# Patient Record
Sex: Female | Born: 1960 | Race: White | Hispanic: No | Marital: Married | Smoking: Never smoker
Health system: Southern US, Community
[De-identification: ages and names within clinical notes are randomized; demographics above are authoritative.]

## PROBLEM LIST (undated history)

## (undated) DIAGNOSIS — K219 Gastro-esophageal reflux disease without esophagitis: Secondary | ICD-10-CM

## (undated) DIAGNOSIS — I10 Essential (primary) hypertension: Secondary | ICD-10-CM

## (undated) DIAGNOSIS — T7840XA Allergy, unspecified, initial encounter: Secondary | ICD-10-CM

## (undated) DIAGNOSIS — M7712 Lateral epicondylitis, left elbow: Secondary | ICD-10-CM

## (undated) HISTORY — DX: Morbid (severe) obesity due to excess calories: E66.01

## (undated) HISTORY — DX: Allergy, unspecified, initial encounter: T78.40XA

## (undated) HISTORY — PX: COLONOSCOPY: SHX174

## (undated) HISTORY — PX: NO PAST SURGERIES: SHX2092

## (undated) HISTORY — DX: Essential (primary) hypertension: I10

## (undated) HISTORY — DX: Gastro-esophageal reflux disease without esophagitis: K21.9

---

## 2006-02-10 ENCOUNTER — Ambulatory Visit: Payer: Self-pay | Admitting: Internal Medicine

## 2006-12-24 ENCOUNTER — Ambulatory Visit: Payer: Self-pay | Admitting: Specialist

## 2007-06-02 ENCOUNTER — Ambulatory Visit: Payer: Self-pay | Admitting: Internal Medicine

## 2008-11-19 ENCOUNTER — Ambulatory Visit: Payer: Self-pay | Admitting: Internal Medicine

## 2010-09-19 ENCOUNTER — Ambulatory Visit (INDEPENDENT_AMBULATORY_CARE_PROVIDER_SITE_OTHER): Payer: BC Managed Care – PPO | Admitting: Family Medicine

## 2010-09-19 ENCOUNTER — Other Ambulatory Visit (HOSPITAL_COMMUNITY)
Admission: RE | Admit: 2010-09-19 | Discharge: 2010-09-19 | Disposition: A | Discharge status: Home / Self Care | Payer: BC Managed Care – PPO | Source: Ambulatory Visit | Attending: Family Medicine | Admitting: Family Medicine

## 2010-09-19 ENCOUNTER — Encounter: Payer: Self-pay | Admitting: Family Medicine

## 2010-09-19 DIAGNOSIS — R87810 Cervical high risk human papillomavirus (HPV) DNA test positive: Secondary | ICD-10-CM | POA: Insufficient documentation

## 2010-09-19 DIAGNOSIS — Z1231 Encounter for screening mammogram for malignant neoplasm of breast: Secondary | ICD-10-CM

## 2010-09-19 DIAGNOSIS — Z862 Personal history of diseases of the blood and blood-forming organs and certain disorders involving the immune mechanism: Secondary | ICD-10-CM | POA: Insufficient documentation

## 2010-09-19 DIAGNOSIS — Z01419 Encounter for gynecological examination (general) (routine) without abnormal findings: Secondary | ICD-10-CM | POA: Insufficient documentation

## 2010-09-19 DIAGNOSIS — I10 Essential (primary) hypertension: Secondary | ICD-10-CM

## 2010-09-19 DIAGNOSIS — K219 Gastro-esophageal reflux disease without esophagitis: Secondary | ICD-10-CM | POA: Insufficient documentation

## 2010-09-19 DIAGNOSIS — Z0000 Encounter for general adult medical examination without abnormal findings: Secondary | ICD-10-CM

## 2010-09-19 DIAGNOSIS — J309 Allergic rhinitis, unspecified: Secondary | ICD-10-CM | POA: Insufficient documentation

## 2010-09-19 MED ORDER — LISINOPRIL-HYDROCHLOROTHIAZIDE 20-25 MG PO TABS: 1.0000 | ORAL_TABLET | Freq: Every day | ORAL | Status: DC

## 2010-09-19 NOTE — Patient Instructions (Addendum)
Look into when last tetanus was. Work on regular exercsie, weight loss and healthy diet. Try to increase fruits and veggies, fiber.

## 2010-09-19 NOTE — Assessment & Plan Note (Signed)
Stable on omeprazole, but has to take daily. Works on low irritant diet.

## 2010-09-19 NOTE — Progress Notes (Signed)
Subjective:    Patient ID: Bridget Reed, female    DOB: 11-28-1960, 49 y.o.   MRN: [id number]  HPI  50 year old female here to establish with history of HTN on lisinopril, HCTZ .  HTN is well controlled.  Has history of  Iron defanemia.. Unclear cause. But nml endoscopy, colonoscopy showed polyps   She also has GERD, well controlled on omeprazole 20 mg daily.  Last CPX several years ago.. Had nml pap and mammogram at that time. May have had tetanus at [facility] where she works.  Recent labs for chol/ CMET/ TSH at [facility] wellness.    Review of Systems  Constitutional: Negative for fever, fatigue and unexpected weight change.  HENT: Positive for congestion. Negative for ear pain, sore throat, sneezing, trouble swallowing and sinus pressure.   Eyes: Negative for pain and itching.  Respiratory: Negative for cough, shortness of breath and wheezing.   Cardiovascular: Negative for chest pain, palpitations and leg swelling.  Gastrointestinal: Negative for nausea, abdominal pain, diarrhea, constipation and blood in stool.  Genitourinary: Negative for dysuria, hematuria, vaginal discharge, difficulty urinating and menstrual problem.  Skin: Negative for rash.  Neurological: Negative for syncope, weakness, light-headedness, numbness and headaches.  Psychiatric/Behavioral: Negative for confusion and dysphoric mood. The patient is not nervous/anxious.        Increase stress.       Objective:   Physical Exam  Constitutional: Vital signs are normal. She appears well-developed and well-nourished. She is cooperative.  Non-toxic appearance. She does not appear ill. No distress.  HENT:  Head: Normocephalic.  Right Ear: Hearing, tympanic membrane, external ear and ear canal normal.  Left Ear: Hearing, tympanic membrane, external ear and ear canal normal.  Nose: Nose normal.  Eyes: Conjunctivae, EOM and lids are normal. Pupils are equal, round, and reactive to light. No foreign bodies found.  Neck:  Trachea normal and normal range of motion. Neck supple. Carotid bruit is not present. No mass and no thyromegaly present.  Cardiovascular: Normal rate, regular rhythm, S1 normal, S2 normal, normal heart sounds and intact distal pulses.  Exam reveals no gallop.   No murmur heard. Pulmonary/Chest: Effort normal and breath sounds normal. No respiratory distress. She has no wheezes. She has no rhonchi. She has no rales.  Abdominal: Soft. Normal appearance and bowel sounds are normal. She exhibits no distension, no fluid wave, no abdominal bruit and no mass. There is no hepatosplenomegaly. There is no tenderness. There is no rebound, no guarding and no CVA tenderness. No hernia.  Genitourinary: Vagina normal and uterus normal. No breast swelling, tenderness, discharge or bleeding. Pelvic exam was performed with patient prone. There is no rash, tenderness or lesion on the right labia. There is no rash, tenderness or lesion on the left labia. Uterus is not enlarged and not tender. Cervix exhibits no motion tenderness, no discharge and no friability. Right adnexum displays no mass, no tenderness and no fullness. Left adnexum displays no mass, no tenderness and no fullness.  Lymphadenopathy:    She has no cervical adenopathy.    She has no axillary adenopathy.  Neurological: She is alert. She has normal strength. No cranial nerve deficit or sensory deficit.  Skin: Skin is warm, dry and intact. No rash noted.  Psychiatric: Her speech is normal and behavior is normal. Judgment normal. Her mood appears not anxious. Cognition and memory are normal. She does not exhibit a depressed mood.          Assessment & Plan:  Complete  Physical Exam: The patient's preventative maintenance and recommended screening tests for an annual wellness exam were reviewed in full today. Brought up to date unless services declined.  Counselled on the importance of diet, exercise, and its role in overall health and mortality. The  patient's FH and SH was reviewed, including their home life, tobacco status, and drug and alcohol status.

## 2010-09-19 NOTE — Assessment & Plan Note (Signed)
Stable on OTC meds 

## 2010-09-19 NOTE — Assessment & Plan Note (Signed)
Recent addition of HCTZ with BPs increased due to stress. Well controlled since on HCTZ. Continue current medication.

## 2010-09-29 ENCOUNTER — Encounter: Payer: Self-pay | Admitting: *Deleted

## 2011-01-16 ENCOUNTER — Other Ambulatory Visit: Payer: Self-pay | Admitting: *Deleted

## 2011-01-16 MED ORDER — OMEPRAZOLE 20 MG PO TBEC: 1.0000 | DELAYED_RELEASE_TABLET | Freq: Every day | ORAL | Status: DC

## 2011-06-12 ENCOUNTER — Encounter: Payer: Self-pay | Admitting: Family Medicine

## 2011-06-12 ENCOUNTER — Ambulatory Visit (INDEPENDENT_AMBULATORY_CARE_PROVIDER_SITE_OTHER): Payer: BC Managed Care – PPO | Admitting: Family Medicine

## 2011-06-12 DIAGNOSIS — J069 Acute upper respiratory infection, unspecified: Secondary | ICD-10-CM

## 2011-06-12 NOTE — Patient Instructions (Signed)
I think this is likely a nonflu virus, a cold.  They can make your joints and muscles ache for days.  I would take aleve, 2  tabs twice a day with food. Drink plenty of fluids.  I would stay out of work until the aches are better. You are potentially contagious.  Get some rest.

## 2011-06-12 NOTE — Progress Notes (Signed)
duration of symptoms: 1-2 days, didn't feel well yesterday AM.   Rhinorrhea: no Congestion: yes ear pain: minimal sore throat: no Cough:yes, possibly from post nasal gtt myalgias:yes other concerns:HA but no fever.  Her temp is mildly elevated today Mult sick exposures at work.   ROS: See HPI.  Otherwise negative.    Meds, vitals, and allergies reviewed.   GEN: nad, alert and oriented, nontoxic.  HEENT: mucous membranes moist, TM w/o erythema, nasal epithelium injected, OP with cobblestoning NECK: supple w/o LA CV: rrr. PULM: ctab, no inc wob ABD: soft, +bs EXT: no edema

## 2011-06-14 DIAGNOSIS — J069 Acute upper respiratory infection, unspecified: Secondary | ICD-10-CM | POA: Insufficient documentation

## 2011-06-14 NOTE — Assessment & Plan Note (Signed)
Nontoxic, likely viral, supportive care.  See instructions.  F/u prn.

## 2011-06-15 ENCOUNTER — Encounter: Payer: Self-pay | Admitting: *Deleted

## 2011-06-15 ENCOUNTER — Telehealth: Payer: Self-pay | Admitting: Family Medicine

## 2011-06-15 ENCOUNTER — Encounter: Payer: Self-pay | Admitting: Family Medicine

## 2011-06-15 ENCOUNTER — Ambulatory Visit (INDEPENDENT_AMBULATORY_CARE_PROVIDER_SITE_OTHER): Payer: BC Managed Care – PPO | Admitting: Family Medicine

## 2011-06-15 VITALS — BP 110/70 | HR 118 | Temp 99.5°F | Ht 62.0 in | Wt 222.8 lb

## 2011-06-15 DIAGNOSIS — J069 Acute upper respiratory infection, unspecified: Secondary | ICD-10-CM

## 2011-06-15 MED ORDER — HYDROCOD POLST-CHLORPHEN POLST 10-8 MG/5ML PO LQCR: 5.0000 mL | Freq: Every evening | ORAL | Status: DC | PRN

## 2011-06-15 NOTE — Telephone Encounter (Signed)
Triage Record Num: [id number] Operator: Valene Bors Patient Name: Bridget Reed Call Date & Time: 06/15/2011 8:48:29AM Patient Phone: [phone] PCP: Kerby Nora Patient Gender: Female PCP Fax : [phone] Patient DOB: Apr 26, 1961 Practice Name: [facility] Reason for Call: Caller: Leland/Patient; PCP: Excell Seltzer.; CB#: [phone]; Call regarding [name] being seen in the office on 06/12/11 for Fever/Cough/Congestion. Dr. Para March advised for her to try OTC cough meds for flu-like sx. She has been following treatment advice but now cough worse onset 06/13/11. She is waking up frequently at night coughing, Clear runny nose and chest feel achy, voice hoarse, cough sounds wheezy at times. Care advice per Flu Like Sx Protocol and appnt scheduled at 1545 06/15/11. Protocol(s) Used: Cough - Adult Protocol(s) Used: Flu-Like Symptoms Recommended Outcome per Protocol: Call Provider within 8 Hours Reason for Outcome: Sudden onset of flu-like symptoms Evaluated by provider AND symptoms worsening when following recommended treatment plan for 48 hours Care Advice: ~ Use a cool mist humidifier to moisten air. Be sure to clean according to manufacturer's instructions. ~ Rest until symptoms improve. ~ Consider use of a saline nasal spray per package directions to help relieve nasal congestion. Aspirin should not be used in anyone, 50 years of age and under, for temperature control, pain, or aches when flu is a possibility. ~ ~ If you can, stop smoking now and avoid all secondhand smoke. ~ SYMPTOM / CONDITION MANAGEMENT ~ IMMEDIATE ACTION ~ INFECTION CONTROL ~ CAUTIONS Coughing up mucus or phlegm helps to get rid of an infection. A productive cough should not be stopped. A cough medicine with guaifenesin (Robitussin, Mucinex) can help loosen the mucus. Cough medicine with dextromethorphan (DM) should be avoided. Drinking lots of fluids can help loosen the mucus too, especially warm  fluids. ~ If your provider has not called back within 8 hours to discuss your symptoms and recommend the appropriate care for your situation, see a provider immediately. ~ ~ Follow public health advisories. Most adults need to drink 6-10 eight-ounce glasses (1.2-2.0 liters) of fluids per day unless previously told to limit fluid intake for other medical reasons. Limit fluids that contain caffeine, sugar or alcohol. Urine will be a very light yellow color when you drink enough fluids. ~ Analgesic/Antipyretic Advice - Acetaminophen: Consider acetaminophen as directed on label or by pharmacist/provider for pain or fever. PRECAUTIONS: - Use only if there is no history of liver disease, alcoholism, or intake of three or more alcohol drinks per day. - If approved by provider when breastfeeding. - Do not exceed recommended dose or frequency. ~ ~ Sore Throat Relief: 06/15/2011 9:39:15AM Page 1 of 2 [id number] [facility] Triage Call Report Patient Name: [name] continuation page/s - Use warm salt water gargles 3 to 4 times/day, as needed (1/2 tsp. salt in 8 oz. [.2 liters] water). - Suck on hard candy, nonprescription or herbal throat lozenges (sugar-free if diabetic) - Eat soothing, soft food/fluids (broths, soups, or honey and lemon juice in hot tea, Popsicles, frozen yogurt or sherbet, scrambled eggs, cooked cereals, Jell-O or puddings) whichever is most comforting. -A Anavlogieds ieca/tAinngt ispaylrteyt,i csp Aicdyv oicre a -c iNdSicA fIoDods:s . Consider aspirin, ibuprofen, naproxen or ketoprofen for pain or fever as directed on label or by pharmacist/provider. PRECAUTIONS: - If over 50 years of age, should not take longer than 1 week without consulting provider. EXCEPTIONS: - Should not be used if taking blood thinners or have bleeding problems. - Do not use if have history of sensitivity/allergy to  any of these medications; or history of cardiovascular, ulcer, kidney,  liver disease or diabetes unless approved by provider. - Do not exceed recommended dose or frequency. ~ See a provider immediately or go to the Emergency Department if having chest pain with breathing, change in level of consciousness, new seizure, vomiting and unable to keep fluids down for 8 hours or more, or has not urinated for 8 or more hours. ~ Influenza - Expected Course: - Symptoms start to improve in 3 to 7 days. - Cough and feeling tired (malaise) may continue for several weeks. - Cough and extreme fatigue lasting more than 3 weeks needs medical evaluation. - Remain at home at least 24 hours after being free of fever 100F (37.8C) without the use of fever reducing medication. ~ Do not go to work, school, or any community activity until you have not had a fever (100F or 37.8C) for at least 24 hours without taking fever-reducing medication. This means staying at home for at least 3 to 5, possibly 7 days. ~ ~ If alone, have someone check in with you several times a day while you are feeling ill. Nasal Irrigation To Relieve Congestion: - Wash hands with soap and water. - Add 1/2 teaspoon salt to 1 cup warm water to make saltwater solution. - Use a bulb syringe to draw up the saltwater solution. Turn the bulb upright to squeeze out any air. Fill the syringe until is full. - Bend over sink bowl and lean slightly toward sink. Gently insert the tip of the syringe into nostril about 1/2 inch. Point tip toward outer corner of eye and GENTLY squeeze bulb to squirt saltwater into nostril. Forceful irrigation to clear sinuses requires the use of sterile water or saline. - Let solution drain from nose. Solution may come out of the other nostril or even your mouth. Do two full syringes of solution in each nostril. - Rinse syringe in clean water several times. Be sure water comes out clear. Dry the bulb syringe and store in a container or plastic bag. ~ 01/21/

## 2011-06-15 NOTE — Progress Notes (Signed)
  Subjective:    Patient ID: Bridget Reed, female    DOB: 1961/03/16, 51 y.o.   MRN: [id number]  HPI  51 year old female nonsmoker presents for persistant cough symtpoms.  She saw Dr. Para March on 1/18 with 2 days history of  body ache, mild cough.  She was diagnosed with viral illness and given symptomatic treatment.  Today she reports that her cough has gotten worse. Trouble taking deep breath because she will cough. Cough is dry. Fever 101. 2 days ago. Using advil to treat. No ear pain, no face pain. No SOB. No wheeze.  Using tussin, nyquil for cough. Waking up at night with cough.   Review of Systems  Constitutional: Positive for fever.  HENT: Negative for ear pain.   Eyes: Negative for pain.  Respiratory: Negative for shortness of breath.   Cardiovascular: Negative for chest pain.       Objective:   Physical Exam  Constitutional: Vital signs are normal. She appears well-developed and well-nourished. She is cooperative.  Non-toxic appearance. She does not appear ill. No distress.  HENT:  Head: Normocephalic.  Right Ear: Hearing, tympanic membrane, external ear and ear canal normal. Tympanic membrane is not erythematous, not retracted and not bulging.  Left Ear: Hearing, tympanic membrane, external ear and ear canal normal. Tympanic membrane is not erythematous, not retracted and not bulging.  Nose: Mucosal edema and rhinorrhea present. Right sinus exhibits no maxillary sinus tenderness and no frontal sinus tenderness. Left sinus exhibits no maxillary sinus tenderness and no frontal sinus tenderness.  Mouth/Throat: Uvula is midline, oropharynx is clear and moist and mucous membranes are normal.  Eyes: Conjunctivae, EOM and lids are normal. Pupils are equal, round, and reactive to light. No foreign bodies found.  Neck: Trachea normal and normal range of motion. Neck supple. Carotid bruit is not present. No mass and no thyromegaly present.  Cardiovascular: Normal rate, regular  rhythm, S1 normal, S2 normal, normal heart sounds, intact distal pulses and normal pulses.  Exam reveals no gallop and no friction rub.   No murmur heard. Pulmonary/Chest: Effort normal and breath sounds normal. Not tachypneic. No respiratory distress. She has no decreased breath sounds. She has no wheezes. She has no rhonchi. She has no rales.       Coughing spasm with deep breaths  Genitourinary: Vagina normal and uterus normal.  Neurological: She is alert.  Skin: Skin is warm, dry and intact. No rash noted.  Psychiatric: Her speech is normal and behavior is normal. Judgment normal. Her mood appears not anxious. Cognition and memory are normal. She does not exhibit a depressed mood.          Assessment & Plan:

## 2011-06-15 NOTE — Assessment & Plan Note (Signed)
Symptoms more consistent with flu at this point but out of timeframe for tamiflu. Doubt bacterial infection given nonsmoker, low risk. Recommended symptomatic care, sent in cough suppressant for bedtime.  Pt to call if not turning the corner by day 7-10 of illness as expected with viral infection.

## 2011-06-15 NOTE — Patient Instructions (Signed)
Mucinex DM (guafenesin and dextromethorphan) durin the day. Prescription cough supressant at night.  Call if not turing the corner as expected with viral/flu illness in next 2-4 days for consideration of antibotics. Push fluids. Do not return to work until 24 hours after fever gone off advil.

## 2011-08-04 ENCOUNTER — Ambulatory Visit: Payer: Self-pay | Admitting: General Practice

## 2011-10-15 ENCOUNTER — Other Ambulatory Visit: Payer: Self-pay | Admitting: *Deleted

## 2011-10-15 MED ORDER — LISINOPRIL-HYDROCHLOROTHIAZIDE 20-25 MG PO TABS: 1.0000 | ORAL_TABLET | Freq: Every day | ORAL | Status: DC

## 2011-10-23 ENCOUNTER — Telehealth: Payer: Self-pay | Admitting: Family Medicine

## 2011-10-23 DIAGNOSIS — I10 Essential (primary) hypertension: Secondary | ICD-10-CM

## 2011-10-23 NOTE — Telephone Encounter (Signed)
Message copied by Excell Seltzer on Fri Oct 23, 2011  9:51 PM ------      Message from: Baldomero Lamy      Created: Thu Oct 22, 2011  1:40 PM      Regarding: Cpx labs Tues 6/4       Please order  future cpx labs for pt's upcomming lab appt.      Thanks      Rodney Booze

## 2011-10-27 ENCOUNTER — Other Ambulatory Visit (INDEPENDENT_AMBULATORY_CARE_PROVIDER_SITE_OTHER): Payer: BC Managed Care – PPO

## 2011-10-27 DIAGNOSIS — I10 Essential (primary) hypertension: Secondary | ICD-10-CM

## 2011-10-27 LAB — COMPREHENSIVE METABOLIC PANEL
ALT: 21 U/L (ref 0–35)
CO2: 27 mEq/L (ref 19–32)
Calcium: 9.2 mg/dL (ref 8.4–10.5)
Chloride: 106 mEq/L (ref 96–112)
Creatinine, Ser: 0.6 mg/dL (ref 0.4–1.2)
GFR: 108.38 mL/min (ref >60.00)
Sodium: 141 mEq/L (ref 135–145)
Total Protein: 7.2 g/dL (ref 6.0–8.3)

## 2011-11-03 ENCOUNTER — Ambulatory Visit (INDEPENDENT_AMBULATORY_CARE_PROVIDER_SITE_OTHER): Payer: BC Managed Care – PPO | Admitting: Family Medicine

## 2011-11-03 ENCOUNTER — Other Ambulatory Visit (HOSPITAL_COMMUNITY)
Admission: RE | Admit: 2011-11-03 | Discharge: 2011-11-03 | Disposition: A | Discharge status: Home / Self Care | Payer: BC Managed Care – PPO | Source: Ambulatory Visit | Attending: Family Medicine | Admitting: Family Medicine

## 2011-11-03 ENCOUNTER — Encounter: Payer: Self-pay | Admitting: Family Medicine

## 2011-11-03 VITALS — BP 130/72 | HR 82 | Temp 98.7°F | Ht 62.0 in | Wt 229.5 lb

## 2011-11-03 DIAGNOSIS — Z0000 Encounter for general adult medical examination without abnormal findings: Secondary | ICD-10-CM

## 2011-11-03 DIAGNOSIS — Z01419 Encounter for gynecological examination (general) (routine) without abnormal findings: Secondary | ICD-10-CM | POA: Insufficient documentation

## 2011-11-03 DIAGNOSIS — I10 Essential (primary) hypertension: Secondary | ICD-10-CM

## 2011-11-03 DIAGNOSIS — J019 Acute sinusitis, unspecified: Secondary | ICD-10-CM

## 2011-11-03 MED ORDER — AMOXICILLIN 500 MG PO CAPS: 1000.0000 mg | ORAL_CAPSULE | Freq: Two times a day (BID) | ORAL | Status: AC

## 2011-11-03 NOTE — Progress Notes (Signed)
Subjective:    Patient ID: Tranise Forrest, female    DOB: 1960-09-30, 51 y.o.   MRN: [id number]  HPI  The patient is here for annual wellness exam and preventative care.    She has been stressed out in last 6 week on big project at work.   In last 7-10 days she has been having facial pain, teeth achy. Congestion. NO fever.  Daily headache. Taking Benadryl, Claritin.  Occ hot flashes.  Hypertension:  Well controlled on lisinopril HCTZ. Using medication without problems or lightheadedness:  none Chest pain with exertion:None Edema:None Short of breath:None Average home BPs: Good Other issues: Bike riding., working on eating better.  Review of Systems  Constitutional: Positive for fatigue. Negative for fever and unexpected weight change.       Occ Hot flashes, mother went to menopause in early 35s  HENT: Negative for ear pain, congestion, sore throat, sneezing, trouble swallowing and sinus pressure.   Eyes: Negative for pain and itching.  Respiratory: Negative for cough, shortness of breath and wheezing.   Cardiovascular: Negative for chest pain, palpitations and leg swelling.  Gastrointestinal: Negative for nausea, abdominal pain, diarrhea, constipation and blood in stool.  Genitourinary: Positive for menstrual problem. Negative for dysuria, hematuria, vaginal discharge and difficulty urinating.       Menses spacing out every 3-6 weeks.  Skin: Negative for rash.  Neurological: Negative for syncope, weakness, light-headedness, numbness and headaches.  Psychiatric/Behavioral: Negative for confusion and dysphoric mood. The patient is not nervous/anxious.        Objective:   Physical Exam  Constitutional: Vital signs are normal. She appears well-developed and well-nourished. She is cooperative.  Non-toxic appearance. She does not appear ill. No distress.  HENT:  Head: Normocephalic.  Right Ear: Hearing, tympanic membrane, external ear and ear canal normal. Tympanic membrane is not  erythematous, not retracted and not bulging.  Left Ear: Hearing, tympanic membrane, external ear and ear canal normal. Tympanic membrane is not erythematous, not retracted and not bulging.  Nose: Mucosal edema and rhinorrhea present. Right sinus exhibits maxillary sinus tenderness. Right sinus exhibits no frontal sinus tenderness. Left sinus exhibits maxillary sinus tenderness. Left sinus exhibits no frontal sinus tenderness.  Mouth/Throat: Uvula is midline, oropharynx is clear and moist and mucous membranes are normal.  Eyes: Conjunctivae, EOM and lids are normal. Pupils are equal, round, and reactive to light. No foreign bodies found.  Neck: Trachea normal and normal range of motion. Neck supple. Carotid bruit is not present. No mass and no thyromegaly present.  Cardiovascular: Normal rate, regular rhythm, S1 normal, S2 normal, normal heart sounds, intact distal pulses and normal pulses.  Exam reveals no gallop and no friction rub.   No murmur heard. Pulmonary/Chest: Effort normal and breath sounds normal. Not tachypneic. No respiratory distress. She has no decreased breath sounds. She has no wheezes. She has no rhonchi. She has no rales.  Abdominal: Soft. Normal appearance and bowel sounds are normal. She exhibits no distension, no fluid wave, no abdominal bruit and no mass. There is no hepatosplenomegaly. There is no tenderness. There is no rebound, no guarding and no CVA tenderness. No hernia.  Genitourinary: Vagina normal and uterus normal. No breast swelling, tenderness, discharge or bleeding. Pelvic exam was performed with patient prone. There is no rash, tenderness or lesion on the right labia. There is no rash, tenderness or lesion on the left labia. Uterus is not enlarged and not tender. Cervix exhibits no motion tenderness, no discharge and no friability.  Right adnexum displays no mass, no tenderness and no fullness. Left adnexum displays no mass, no tenderness and no fullness.    Lymphadenopathy:    She has no cervical adenopathy.    She has no axillary adenopathy.  Neurological: She is alert. She has normal strength. No cranial nerve deficit or sensory deficit.  Skin: Skin is warm, dry and intact. No rash noted.  Psychiatric: Her speech is normal and behavior is normal. Judgment normal. Her mood appears not anxious. Cognition and memory are normal. She does not exhibit a depressed mood.          Assessment & Plan:  The patient's preventative maintenance and recommended screening tests for an annual wellness exam were reviewed in full today. Brought up to date unless services declined.  Counselled on the importance of diet, exercise, and its role in overall health and mortality. The patient's FH and SH was reviewed, including their home life, tobacco status, and drug and alcohol status.   Pap/DVE: abnormal pap last year, never returned for 6 month repeat pap. Will check HPV this year. Mammo: per pt at work 08/2011 Non smoker  Vaccines:  Last Td 2 years ago with bad cut.

## 2011-11-03 NOTE — Patient Instructions (Signed)
Get back on track with healthy eating, weight loss and exercise. We will call you with pap smear results.

## 2011-11-03 NOTE — Assessment & Plan Note (Signed)
Well controlled. Continue current medication.  

## 2011-11-03 NOTE — Assessment & Plan Note (Signed)
Treat with mucinex, nasal saline and given length of symptoms will treat with antibiotic.

## 2011-11-06 ENCOUNTER — Encounter: Payer: Self-pay | Admitting: *Deleted

## 2011-11-16 ENCOUNTER — Other Ambulatory Visit: Payer: Self-pay

## 2011-11-16 MED ORDER — OMEPRAZOLE 20 MG PO TBEC: 1.0000 | DELAYED_RELEASE_TABLET | Freq: Every day | ORAL | Status: DC

## 2011-11-16 MED ORDER — LISINOPRIL-HYDROCHLOROTHIAZIDE 20-25 MG PO TABS: 1.0000 | ORAL_TABLET | Freq: Every day | ORAL | Status: DC

## 2011-11-16 NOTE — Telephone Encounter (Signed)
Pt request omeprazole and lisinopril HCTZ # 90 x 3 on each. Pt notified while on phone.

## 2011-12-01 ENCOUNTER — Other Ambulatory Visit: Payer: Self-pay

## 2011-12-01 NOTE — Telephone Encounter (Signed)
Pt wanted confirmation Lisinopril HCTZ and omeprazole was sent to mail order pharmacy. Sent electronically on 11/18/11. Pt will ck with pharmacy.

## 2012-02-09 ENCOUNTER — Encounter: Payer: Self-pay | Admitting: Family Medicine

## 2012-02-09 ENCOUNTER — Ambulatory Visit (INDEPENDENT_AMBULATORY_CARE_PROVIDER_SITE_OTHER): Payer: BC Managed Care – PPO | Admitting: Family Medicine

## 2012-02-09 VITALS — BP 140/71 | HR 83 | Temp 98.2°F | Ht 62.0 in | Wt 229.0 lb

## 2012-02-09 DIAGNOSIS — R0683 Snoring: Secondary | ICD-10-CM | POA: Insufficient documentation

## 2012-02-09 DIAGNOSIS — R5383 Other fatigue: Secondary | ICD-10-CM | POA: Insufficient documentation

## 2012-02-09 DIAGNOSIS — R0609 Other forms of dyspnea: Secondary | ICD-10-CM

## 2012-02-09 DIAGNOSIS — R0989 Other specified symptoms and signs involving the circulatory and respiratory systems: Secondary | ICD-10-CM

## 2012-02-09 DIAGNOSIS — R5381 Other malaise: Secondary | ICD-10-CM

## 2012-02-09 NOTE — Patient Instructions (Addendum)
Get B12 checked at [facility] lab. Stop at front desk to set up sleep referral for pulmonology.

## 2012-02-09 NOTE — Progress Notes (Signed)
  Subjective:    Patient ID: Bridget Reed, female    DOB: [birth year], 51 y.o.   MRN: [id number]  HPI 51 year old female with history of iron def anemia, HTN GERD presents with fatigue ongoing for several months. She feels like she cannot stay awake during the day.  Getting adequate sleep at night. Does wake up occ at night, no nocturia, husband has noted snoring more, but no apneic spells.  She reports awaking in morning unrefreshed.. "feels like she is in a fog". She feel like she [place] under lot of stress at work. This is very unusual for this active lady. She denies depression, anxiety.  No morning headaches. No CP, no SOB, but feels like she cannot take deep breath. She has been having irregular menses.  In last few weeks she has been trying to increase exercise.   Saw PA at work.. Has labs today, BP 124/72. Labs were nonfasting.. Glucose 120. Nml cr, AST, ALt and electrolytes, TSH, free t3 and t4 nml, Hg 13.6, estrogen, FSH and LH showed no evidence of menopause.  Mother menopause early 5s.  vit D 33.  Has started vit D3 now.    Review of Systems  Constitutional: Negative for fever and fatigue.  HENT: Negative for ear pain.   Eyes: Negative for pain.  Respiratory: Negative for chest tightness and shortness of breath.   Cardiovascular: Negative for chest pain, palpitations and leg swelling.  Gastrointestinal: Negative for abdominal pain.  Genitourinary: Negative for dysuria.       Objective:   Physical Exam  Constitutional: Vital signs are normal. She appears well-developed and well-nourished. She is cooperative.  Non-toxic appearance. She does not appear ill. No distress.  HENT:  Head: Normocephalic.  Right Ear: Hearing, tympanic membrane, external ear and ear canal normal. Tympanic membrane is not erythematous, not retracted and not bulging.  Left Ear: Hearing, tympanic membrane, external ear and ear canal normal. Tympanic membrane is not erythematous, not retracted and  not bulging.  Nose: No mucosal edema or rhinorrhea. Right sinus exhibits no maxillary sinus tenderness and no frontal sinus tenderness. Left sinus exhibits no maxillary sinus tenderness and no frontal sinus tenderness.  Mouth/Throat: Uvula is midline, oropharynx is clear and moist and mucous membranes are normal.  Eyes: Conjunctivae normal, EOM and lids are normal. Pupils are equal, round, and reactive to light. No foreign bodies found.  Neck: Trachea normal and normal range of motion. Neck supple. Carotid bruit is not present. No mass and no thyromegaly present.  Cardiovascular: Normal rate, regular rhythm, S1 normal, S2 normal, normal heart sounds, intact distal pulses and normal pulses.  Exam reveals no gallop and no friction rub.   No murmur heard. Pulmonary/Chest: Effort normal and breath sounds normal. Not tachypneic. No respiratory distress. She has no decreased breath sounds. She has no wheezes. She has no rhonchi. She has no rales.  Abdominal: Soft. Normal appearance and bowel sounds are normal. There is no tenderness.  Neurological: She is alert.  Skin: Skin is warm, dry and intact. No rash noted.  Psychiatric: Her speech is normal and behavior is normal. Judgment and thought content normal. Her mood appears not anxious. Cognition and memory are normal. She does not exhibit a depressed mood.          Assessment & Plan:

## 2012-02-09 NOTE — Assessment & Plan Note (Signed)
No sign of thyroid issues, no anemia, possible early perimenopause, stress possibly contributing but no overt depression. Send for B12 and sleep study to eval.

## 2012-03-07 ENCOUNTER — Institutional Professional Consult (permissible substitution): Payer: BC Managed Care – PPO | Admitting: Pulmonary Disease

## 2012-04-13 ENCOUNTER — Telehealth: Payer: Self-pay | Admitting: Family Medicine

## 2012-04-13 NOTE — Telephone Encounter (Signed)
Patient Information:  Caller Name: Hayes  Phone: [phone]  Patient: Bridget Reed  Gender: Female  DOB: 1960-12-23  Age: 51 Years  PCP: Kerby Nora (Family Practice)  Pregnant: No   Symptoms  Reason For Call & Symptoms: cough, hoarse, runny nose, right ear ache  Reviewed Health History In EMR: Yes  Reviewed Medications In EMR: Yes  Reviewed Allergies In EMR: Yes  Date of Onset of Symptoms: 04/03/2012  Treatments Tried: Dayquil, Nyquil, Mucinex D  Treatments Tried Worked: No OB:  LMP: 03/23/2012  Guideline(s) Used:  Cold Symptoms (Upper Respiratory Infection)  Colds  Disposition Per Guideline:   See Today in Office  Reason For Disposition Reached:   [place]  Advice Given:  Humidifier:  If the air in your home is dry, use a cool-mist humidifier  Office Follow Up:  Does the office need to follow up with this patient?: Yes  Instructions For The Office: No appointments seen for today, please call patient concerning an appointment

## 2012-04-14 NOTE — Telephone Encounter (Signed)
Tried to reach patient and unable to contact

## 2012-10-25 ENCOUNTER — Other Ambulatory Visit: Payer: Self-pay | Admitting: *Deleted

## 2012-10-25 MED ORDER — LISINOPRIL-HYDROCHLOROTHIAZIDE 20-25 MG PO TABS: 1.0000 | ORAL_TABLET | Freq: Every day | ORAL | Status: DC

## 2012-10-25 MED ORDER — OMEPRAZOLE 20 MG PO TBEC: 1.0000 | DELAYED_RELEASE_TABLET | Freq: Every day | ORAL | Status: DC

## 2013-03-15 ENCOUNTER — Other Ambulatory Visit: Payer: Self-pay | Admitting: *Deleted

## 2013-03-15 MED ORDER — OMEPRAZOLE 20 MG PO TBEC: 1.0000 | DELAYED_RELEASE_TABLET | Freq: Every day | ORAL | Status: DC

## 2013-03-15 MED ORDER — LISINOPRIL-HYDROCHLOROTHIAZIDE 20-25 MG PO TABS: 1.0000 | ORAL_TABLET | Freq: Every day | ORAL | Status: DC

## 2013-03-15 NOTE — Telephone Encounter (Signed)
CPX scheduled 06/06/2013 with Dr. Ermalene Searing.

## 2013-03-21 ENCOUNTER — Other Ambulatory Visit: Payer: Self-pay | Admitting: *Deleted

## 2013-03-21 MED ORDER — OMEPRAZOLE 20 MG PO CPDR: 20.0000 mg | DELAYED_RELEASE_CAPSULE | Freq: Every day | ORAL | Status: DC

## 2013-04-17 ENCOUNTER — Ambulatory Visit: Payer: Self-pay | Admitting: Family Medicine

## 2013-04-28 ENCOUNTER — Ambulatory Visit: Payer: Self-pay | Admitting: Family Medicine

## 2013-05-30 ENCOUNTER — Other Ambulatory Visit: Payer: BC Managed Care – PPO

## 2013-06-06 ENCOUNTER — Encounter: Payer: BC Managed Care – PPO | Admitting: Family Medicine

## 2014-04-30 ENCOUNTER — Ambulatory Visit: Payer: Self-pay | Admitting: Medical

## 2014-11-13 DIAGNOSIS — M7710 Lateral epicondylitis, unspecified elbow: Secondary | ICD-10-CM | POA: Insufficient documentation

## 2014-12-07 ENCOUNTER — Other Ambulatory Visit: Payer: Self-pay | Admitting: Family Medicine

## 2014-12-07 DIAGNOSIS — I10 Essential (primary) hypertension: Secondary | ICD-10-CM

## 2015-03-26 ENCOUNTER — Other Ambulatory Visit: Payer: Self-pay | Admitting: Family Medicine

## 2015-04-01 ENCOUNTER — Other Ambulatory Visit: Payer: Self-pay

## 2015-04-01 MED ORDER — OMEPRAZOLE 20 MG PO CPDR
DELAYED_RELEASE_CAPSULE | ORAL | Status: DC
Start: 2015-04-01 — End: 2015-07-16

## 2015-04-01 NOTE — Telephone Encounter (Signed)
Got a fax from [facility], requesting a refill of this patient's Omeprazole 20mg  delayed release.  Patient was last seen on 06/13/14 but has no scheduled appts.  Omeprazole 20MG , 1 (one) Capsule DR daily, #90, 90 days starting 03/16/2014, Ref. x2, Mail Order #90, 90 days, Ref. x2. Active. Recorded 03/16/2014 01:43 PM by Bobetta Lime, MD, Refill Request. ePrescribed to [phone], 03/16/2014 1:43 PM [facility] ([facility]), [place], [place], [place] [id number] [phone]  Refill request was sent to Dr. Steele Sizer for approval and submission.

## 2015-07-01 ENCOUNTER — Other Ambulatory Visit: Payer: Self-pay | Admitting: Family Medicine

## 2015-07-16 ENCOUNTER — Ambulatory Visit (INDEPENDENT_AMBULATORY_CARE_PROVIDER_SITE_OTHER): Payer: BLUE CROSS/BLUE SHIELD | Admitting: Family Medicine

## 2015-07-16 ENCOUNTER — Encounter: Payer: Self-pay | Admitting: Family Medicine

## 2015-07-16 VITALS — BP 132/78 | HR 86 | Temp 98.6°F | Resp 16 | Ht 62.0 in | Wt 232.5 lb

## 2015-07-16 DIAGNOSIS — IMO0001 Reserved for inherently not codable concepts without codable children: Secondary | ICD-10-CM | POA: Insufficient documentation

## 2015-07-16 DIAGNOSIS — K219 Gastro-esophageal reflux disease without esophagitis: Secondary | ICD-10-CM

## 2015-07-16 DIAGNOSIS — Z13220 Encounter for screening for lipoid disorders: Secondary | ICD-10-CM

## 2015-07-16 DIAGNOSIS — Z1211 Encounter for screening for malignant neoplasm of colon: Secondary | ICD-10-CM

## 2015-07-16 DIAGNOSIS — Z113 Encounter for screening for infections with a predominantly sexual mode of transmission: Secondary | ICD-10-CM | POA: Diagnosis not present

## 2015-07-16 DIAGNOSIS — Z136 Encounter for screening for cardiovascular disorders: Secondary | ICD-10-CM | POA: Diagnosis not present

## 2015-07-16 DIAGNOSIS — Z1231 Encounter for screening mammogram for malignant neoplasm of breast: Secondary | ICD-10-CM | POA: Diagnosis not present

## 2015-07-16 DIAGNOSIS — R079 Chest pain, unspecified: Secondary | ICD-10-CM | POA: Diagnosis not present

## 2015-07-16 DIAGNOSIS — Z0000 Encounter for general adult medical examination without abnormal findings: Secondary | ICD-10-CM | POA: Diagnosis not present

## 2015-07-16 MED ORDER — OMEPRAZOLE 20 MG PO CPDR: DELAYED_RELEASE_CAPSULE | ORAL | Status: DC

## 2015-07-16 NOTE — Progress Notes (Signed)
Name: Bridget Reed   MRN: [id number]    DOB: December 18, 1960   Date:07/16/2015       Progress Note  Subjective  Chief Complaint  Chief Complaint  Patient presents with  . Annual Exam    Last pap 05/29/2013    HPI  Patient is here today for a Complete Female Physical Exam:  The patient does report occasional chest pain substernal right sided that lasts for a few minutes and subsides. It is never debilitating but she wanted to mention it. Not associated with radiation, SOB, palpitations, food or activity. Overall feels health needs are stable. Using her HTN medications with no problem. Lisinopril 20 mg a day and HCTZ 12.5 mg a day. For GERD she uses Omeprazole 20 mg a day and needs refills. Diet is well balanced but not consistently and needs to work on portion control. In general does not exercise regularly. Sees dentist regularly and addresses vision concerns with ophthalmologist if applicable. In regards to sexual activity the patient is currently sexually active. Currently is not concerned about exposure to any STDs.    Past Medical History  Diagnosis Date  . Allergy   . Hypertension   . GERD (gastroesophageal reflux disease)     History reviewed. No pertinent past surgical history.  Family History  Problem Relation Age of Onset  . COPD Mother   . Heart failure Mother   . Pancreatic cancer Mother   . Cancer Mother   . Osteopenia Mother   . Cancer Father   . Kidney cancer Father   . Hypertension Father   . Cancer Paternal Aunt     colon and breast cancer in 2 aunts    Social History   Social History  . Marital Status: Divorced    Spouse Name: N/A  . Number of Children: N/A  . Years of Education: N/A   Occupational History  . Not on file.   Social History Main Topics  . Smoking status: Never Smoker   . Smokeless tobacco: Not on file  . Alcohol Use: Yes  . Drug Use: No  . Sexual Activity: Not on file   Other Topics Concern  . Not on file   Social History  Narrative   Diet: moderate.. Working on improving .   Walks 3-4 times a week.      Current outpatient prescriptions:  .  hydrochlorothiazide (HYDRODIURIL) 12.5 MG tablet, TAKE 1 BY MOUTH DAILY FOR HYPERTENSION (GENERIC FOR HYDRODIURIL), Disp: 90 tablet, Rfl: 1 .  lisinopril (PRINIVIL,ZESTRIL) 20 MG tablet, TAKE 1 BY MOUTH DAILY, Disp: 90 tablet, Rfl: 1 .  Loratadine-Pseudoephedrine (KLS ALLERCLEAR D-24HR PO), Take 1 tablet by mouth daily.  , Disp: , Rfl:  .  omeprazole (PRILOSEC) 20 MG capsule, TAKE 1 BY MOUTH DAILY, Disp: 90 capsule, Rfl: 1  Allergies  Allergen Reactions  . Erythromycin     Rash, GI upset  . Influenza Virus Vaccine Split     Possible allergy- rash after injection  . Azithromycin Rash    ROS  CONSTITUTIONAL: No significant weight changes, fever, chills, weakness or fatigue.  HEENT:  - Eyes: No visual changes.  - Ears: No auditory changes. No pain.  - Nose: No sneezing, congestion, runny nose. - Throat: No sore throat. No changes in swallowing. SKIN: No rash or itching.  CARDIOVASCULAR: No chest pain, chest pressure or chest discomfort. No palpitations or edema.  RESPIRATORY: No shortness of breath, cough or sputum.  GASTROINTESTINAL: No anorexia, nausea, vomiting. No changes in  bowel habits. No abdominal pain or blood.  GENITOURINARY: No dysuria. No frequency. No discharge.  NEUROLOGICAL: No headache, dizziness, syncope, paralysis, ataxia, numbness or tingling in the extremities. No memory changes. No change in bowel or bladder control.  MUSCULOSKELETAL: No joint pain. No muscle pain. HEMATOLOGIC: No anemia, bleeding or bruising.  LYMPHATICS: No enlarged lymph nodes.  PSYCHIATRIC: No change in mood. No change in sleep pattern.  ENDOCRINOLOGIC: No reports of sweating, cold or heat intolerance. No polyuria or polydipsia.   Objective  Filed Vitals:   07/16/15 1126  BP: 132/78  Pulse: 86  Temp: 98.6 F (37 C)  TempSrc: Oral  Resp: 16  Height: 5\' 2"   (1.575 m)  Weight: 232 lb 8 oz (105.461 kg)  SpO2: 93%   Body mass index is 42.51 kg/(m^2).  Depression screen PHQ 2/9 07/16/2015  Decreased Interest 0  Down, Depressed, Hopeless 0  PHQ - 2 Score 0     Physical Exam  Constitutional: Patient is obese and well-nourished. In no distress.  HEENT:  - Head: Normocephalic and atraumatic.  - Ears: Bilateral TMs gray, no erythema or effusion - Nose: Nasal mucosa moist - Mouth/Throat: Oropharynx is clear and moist. No tonsillar hypertrophy or erythema. No post nasal drainage.  - Eyes: Conjunctivae clear, EOM movements normal. PERRLA. No scleral icterus.  Neck: Normal range of motion. Neck supple. No JVD present. No thyromegaly present.  Cardiovascular: Normal rate, regular rhythm and normal heart sounds.  No murmur heard.  Pulmonary/Chest: Effort normal and breath sounds normal. No respiratory distress. Abdominal: Soft. Bowel sounds are normal, no distension. There is no tenderness. no masses BREAST: Bilateral breast exam normal with no masses, skin changes or nipple discharge. Left nipple normally slightly inverted patient states. FEMALE GENITALIA: Deferred Musculoskeletal: Normal range of motion bilateral UE and LE, no joint effusions. Peripheral vascular: Bilateral LE no edema. Neurological: CN II-XII grossly intact with no focal deficits. Alert and oriented to person, place, and time. Coordination, balance, strength, speech and gait are normal.  Skin: Skin is warm and dry. No rash noted. No erythema. Scattered nevus, cherry hemangiomas, mild sunburn anterior shoulders and chest wall. Psychiatric: Patient has a normal mood and affect. Behavior is normal in office today. Judgment and thought content normal in office today.   Assessment & Plan  1. Annual physical exam Discussed in detail all recommended preventative measures appropriate for age and gender now and in the future. Sunscreen recommended.    2. Intermittent chest  pain Discussed her risk factors, concerning symptoms to watch out for. Recommended stress testing near future.  - CBC with Differential/Platelet - Comprehensive metabolic panel - Lipid panel - TSH  3. Gastroesophageal reflux disease without esophagitis Refilled  - omeprazole (PRILOSEC) 20 MG capsule; TAKE 1 BY MOUTH DAILY  Dispense: 90 capsule; Refill: 1  4. Encounter for cholesteral screening for cardiovascular disease  - Lipid panel  5. Screening for STD (sexually transmitted disease)  - HIV antibody - Hepatitis C antibody  6. Encounter for screening mammogram for malignant neoplasm of breast  - MM Digital Screening; Future  7. Encounter for screening for malignant neoplasm of colon  - Ambulatory referral to General Surgery

## 2015-07-22 ENCOUNTER — Other Ambulatory Visit: Payer: Self-pay

## 2015-07-22 ENCOUNTER — Telehealth: Payer: Self-pay

## 2015-07-22 NOTE — Telephone Encounter (Signed)
Gastroenterology Pre-Procedure Review  Request Date: 4 11/17 Requesting Physician: Dr.   PATIENT REVIEW QUESTIONS: The patient responded to the following health history questions as indicated:    1. Are you having any GI issues? no 2. Do you have a personal history of Polyps? no 3. Do you have a family history of Colon Cancer or Polyps? yes (paternal aunt) 90. Diabetes Mellitus? no 5. Joint replacements in the past 12 months?no 6. Major health problems in the past 3 months?no 7. Any artificial heart valves, MVP, or defibrillator?no    MEDICATIONS & ALLERGIES:    Patient reports the following regarding taking any anticoagulation/antiplatelet therapy:   Plavix, Coumadin, Eliquis, Xarelto, Lovenox, Pradaxa, Brilinta, or Effient? no Aspirin? no  Patient confirms/reports the following medications:  Current Outpatient Prescriptions  Medication Sig Dispense Refill  . hydrochlorothiazide (HYDRODIURIL) 12.5 MG tablet TAKE 1 BY MOUTH DAILY FOR HYPERTENSION (GENERIC FOR HYDRODIURIL) 90 tablet 1  . lisinopril (PRINIVIL,ZESTRIL) 20 MG tablet TAKE 1 BY MOUTH DAILY 90 tablet 1  . Loratadine-Pseudoephedrine (KLS ALLERCLEAR D-24HR PO) Take 1 tablet by mouth daily.      Marland Kitchen omeprazole (PRILOSEC) 20 MG capsule TAKE 1 BY MOUTH DAILY 90 capsule 1   No current facility-administered medications for this visit.    Patient confirms/reports the following allergies:  Allergies  Allergen Reactions  . Erythromycin     Rash, GI upset  . Influenza Virus Vaccine Split     Possible allergy- rash after injection  . Azithromycin Rash    No orders of the defined types were placed in this encounter.    AUTHORIZATION INFORMATION Primary Insurance: 1D#: Group #:  Secondary Insurance: 1D#: Group #:  SCHEDULE INFORMATION: Date: 09/03/15 Time: Location: [facility]

## 2015-07-23 LAB — HM HEPATITIS C SCREENING LAB: HM Hepatitis Screen: NEGATIVE

## 2015-08-09 ENCOUNTER — Ambulatory Visit: Payer: Self-pay | Admitting: Sports Medicine

## 2015-08-12 ENCOUNTER — Ambulatory Visit
Admission: RE | Admit: 2015-08-12 | Discharge: 2015-08-12 | Disposition: A | Discharge status: Home / Self Care | Payer: BLUE CROSS/BLUE SHIELD | Source: Ambulatory Visit | Attending: Family Medicine | Admitting: Family Medicine

## 2015-08-12 DIAGNOSIS — Z1231 Encounter for screening mammogram for malignant neoplasm of breast: Secondary | ICD-10-CM | POA: Diagnosis not present

## 2015-08-15 ENCOUNTER — Encounter: Payer: Self-pay | Admitting: Podiatry

## 2015-08-15 ENCOUNTER — Ambulatory Visit (INDEPENDENT_AMBULATORY_CARE_PROVIDER_SITE_OTHER): Payer: BLUE CROSS/BLUE SHIELD | Admitting: Podiatry

## 2015-08-15 VITALS — BP 150/90 | HR 79 | Resp 18

## 2015-08-15 DIAGNOSIS — M79675 Pain in left toe(s): Secondary | ICD-10-CM | POA: Diagnosis not present

## 2015-08-15 DIAGNOSIS — B351 Tinea unguium: Secondary | ICD-10-CM

## 2015-08-15 DIAGNOSIS — M79674 Pain in right toe(s): Secondary | ICD-10-CM

## 2015-08-15 NOTE — Patient Instructions (Signed)

## 2015-08-15 NOTE — Progress Notes (Signed)
   Subjective:    Patient ID: Bridget Reed, female    DOB: Oct 01, 1960, 55 y.o.   MRN: [id number]  HPI  55 year old female presents the office today with concerns of toenail fungus was been ongoing for the last year. She has tried some over-the-counter Lotrimin without much relief. She states the nails get painful to get thick. Denies any redness or drainage. No other treatment. No other complaints at this time.  Review of Systems  All other systems reviewed and are negative.      Objective:   Physical Exam General: AAO x3, NAD  Dermatological: Nails brittle be hypertrophic, dystrophic, brittle, discolored, elongated 10. Protective the right second toenails very brittle. This mild tenderness to palpation of the nails 1-5 bilaterally. No surrounding erythema or drainage. No open lesions are present.  Vascular: Dorsalis Pedis artery and Posterior Tibial artery pedal pulses are 2/4 bilateral with immedate capillary fill time. Pedal hair growth present. No varicosities and no lower extremity edema present bilateral. There is no pain with calf compression, swelling, warmth, erythema.   Neruologic: Grossly intact via light touch bilateral. Vibratory intact via tuning fork bilateral. Protective threshold with Semmes Wienstein monofilament intact to all pedal sites bilateral. Patellar and Achilles deep tendon reflexes 2+ bilateral. No Babinski or clonus noted bilateral.   Musculoskeletal: No gross boney pedal deformities bilateral. No pain, crepitus, or limitation noted with foot and ankle range of motion bilateral. Muscular strength 5/5 in all groups tested bilateral.  Gait: Unassisted, Nonantalgic.      Assessment & Plan:  55 year old female with onychomycosis -Treatment options discussed including all alternatives, risks, and complications -I discussed treatment options the patient. At this time should proceed with topical treatment. Compound cream was ordered today. The nails were  biopsied today to ensure adequate treatment. -Discussed was to help prevent spreading and worsening of symptoms. -Follow-up as needed. Call any questions concerns.  Celesta Gentile, DPM

## 2015-08-16 ENCOUNTER — Telehealth: Payer: Self-pay | Admitting: *Deleted

## 2015-08-16 MED ORDER — NONFORMULARY OR COMPOUNDED ITEM: Status: DC

## 2015-08-16 NOTE — Telephone Encounter (Signed)
Dr. Jacqualyn Posey ordered [employer] Onychomycosis nail lacquer.  Faxed.

## 2015-08-28 DIAGNOSIS — M9908 Segmental and somatic dysfunction of rib cage: Secondary | ICD-10-CM | POA: Diagnosis not present

## 2015-08-28 DIAGNOSIS — M9901 Segmental and somatic dysfunction of cervical region: Secondary | ICD-10-CM | POA: Diagnosis not present

## 2015-09-02 ENCOUNTER — Telehealth: Payer: Self-pay | Admitting: *Deleted

## 2015-09-02 NOTE — Telephone Encounter (Signed)
Dr. Jacqualyn Posey reviewed 08/15/2015 fungal culture as positive for fungus.  Pt states She is using the toenail cream Dr. Jacqualyn Posey ordered.

## 2015-09-10 ENCOUNTER — Encounter: Payer: Self-pay | Admitting: Podiatry

## 2015-09-12 DIAGNOSIS — M7711 Lateral epicondylitis, right elbow: Secondary | ICD-10-CM | POA: Diagnosis not present

## 2015-09-12 DIAGNOSIS — M7712 Lateral epicondylitis, left elbow: Secondary | ICD-10-CM | POA: Insufficient documentation

## 2015-09-13 ENCOUNTER — Other Ambulatory Visit: Payer: Self-pay

## 2015-09-24 ENCOUNTER — Ambulatory Visit
Admission: RE | Admit: 2015-09-24 | Payer: BLUE CROSS/BLUE SHIELD | Source: Ambulatory Visit | Admitting: Gastroenterology

## 2015-09-24 ENCOUNTER — Encounter: Admission: RE | Payer: Self-pay | Source: Ambulatory Visit

## 2015-09-24 SURGERY — COLONOSCOPY WITH PROPOFOL
Anesthesia: General

## 2015-10-08 ENCOUNTER — Encounter: Payer: Self-pay | Admitting: *Deleted

## 2015-10-11 NOTE — Discharge Instructions (Signed)

## 2015-10-14 ENCOUNTER — Ambulatory Visit: Payer: BLUE CROSS/BLUE SHIELD | Admitting: Anesthesiology

## 2015-10-14 ENCOUNTER — Encounter: Payer: Self-pay | Admitting: *Deleted

## 2015-10-14 ENCOUNTER — Encounter
Admission: RE | Disposition: A | Discharge status: Home / Self Care | Payer: Self-pay | Source: Ambulatory Visit | Attending: Gastroenterology

## 2015-10-14 ENCOUNTER — Ambulatory Visit
Admission: RE | Admit: 2015-10-14 | Discharge: 2015-10-14 | Disposition: A | Discharge status: Home / Self Care | Payer: BLUE CROSS/BLUE SHIELD | Source: Ambulatory Visit | Attending: Gastroenterology | Admitting: Gastroenterology

## 2015-10-14 DIAGNOSIS — Z79899 Other long term (current) drug therapy: Secondary | ICD-10-CM | POA: Diagnosis not present

## 2015-10-14 DIAGNOSIS — Z860100 Personal history of colon polyps, unspecified: Secondary | ICD-10-CM | POA: Insufficient documentation

## 2015-10-14 DIAGNOSIS — K219 Gastro-esophageal reflux disease without esophagitis: Secondary | ICD-10-CM | POA: Insufficient documentation

## 2015-10-14 DIAGNOSIS — Z1211 Encounter for screening for malignant neoplasm of colon: Secondary | ICD-10-CM | POA: Insufficient documentation

## 2015-10-14 DIAGNOSIS — Z887 Allergy status to serum and vaccine status: Secondary | ICD-10-CM | POA: Diagnosis not present

## 2015-10-14 DIAGNOSIS — K641 Second degree hemorrhoids: Secondary | ICD-10-CM | POA: Insufficient documentation

## 2015-10-14 DIAGNOSIS — Z881 Allergy status to other antibiotic agents status: Secondary | ICD-10-CM | POA: Insufficient documentation

## 2015-10-14 DIAGNOSIS — I10 Essential (primary) hypertension: Secondary | ICD-10-CM | POA: Insufficient documentation

## 2015-10-14 DIAGNOSIS — D122 Benign neoplasm of ascending colon: Secondary | ICD-10-CM | POA: Insufficient documentation

## 2015-10-14 HISTORY — PX: COLONOSCOPY WITH PROPOFOL: SHX5780

## 2015-10-14 HISTORY — DX: Lateral epicondylitis, left elbow: M77.12

## 2015-10-14 SURGERY — COLONOSCOPY WITH PROPOFOL
Anesthesia: Monitor Anesthesia Care | Wound class: Contaminated

## 2015-10-14 MED ORDER — ACETAMINOPHEN 160 MG/5ML PO SOLN: 325.0000 mg | ORAL | Status: DC | PRN

## 2015-10-14 MED ORDER — PROPOFOL 10 MG/ML IV BOLUS
INTRAVENOUS | Status: DC | PRN
  Administered 2015-10-14 (×3): 50 mg via INTRAVENOUS
  Administered 2015-10-14: 100 mg via INTRAVENOUS

## 2015-10-14 MED ORDER — ONDANSETRON HCL 4 MG/2ML IJ SOLN: 4.0000 mg | Freq: Once | INTRAMUSCULAR | Status: DC | PRN

## 2015-10-14 MED ORDER — ACETAMINOPHEN 325 MG PO TABS: 325.0000 mg | ORAL_TABLET | ORAL | Status: DC | PRN

## 2015-10-14 MED ORDER — LACTATED RINGERS IV SOLN
INTRAVENOUS | Status: DC | PRN
  Administered 2015-10-14: 11:00:00 via INTRAVENOUS

## 2015-10-14 MED ORDER — LIDOCAINE HCL (CARDIAC) 20 MG/ML IV SOLN
INTRAVENOUS | Status: DC | PRN
  Administered 2015-10-14: 40 mg via INTRAVENOUS

## 2015-10-14 MED ORDER — STERILE WATER FOR IRRIGATION IR SOLN
Status: DC | PRN
  Administered 2015-10-14: 11:00:00

## 2015-10-14 SURGICAL SUPPLY — 22 items
CANISTER SUCT 1200ML W/VALVE (MISCELLANEOUS) ×2 IMPLANT
CLIP HMST 235XBRD CATH ROT (MISCELLANEOUS) IMPLANT
CLIP RESOLUTION 360 11X235 (MISCELLANEOUS)
FCP ESCP3.2XJMB 240X2.8X (MISCELLANEOUS)
FORCEPS BIOP RAD 4 LRG CAP 4 (CUTTING FORCEPS) IMPLANT
FORCEPS BIOP RJ4 240 W/NDL (MISCELLANEOUS)
FORCEPS ESCP3.2XJMB 240X2.8X (MISCELLANEOUS) IMPLANT
GOWN CVR UNV OPN BCK APRN NK (MISCELLANEOUS) ×2 IMPLANT
GOWN ISOL THUMB LOOP REG UNIV (MISCELLANEOUS) ×2
INJECTOR VARIJECT VIN23 (MISCELLANEOUS) IMPLANT
KIT DEFENDO VALVE AND CONN (KITS) IMPLANT
KIT ENDO PROCEDURE OLY (KITS) ×2 IMPLANT
MARKER SPOT ENDO TATTOO 5ML (MISCELLANEOUS) IMPLANT
PAD GROUND ADULT SPLIT (MISCELLANEOUS) IMPLANT
PROBE APC STR FIRE (PROBE) IMPLANT
SNARE SHORT THROW 13M SML OVAL (MISCELLANEOUS) IMPLANT
SNARE SHORT THROW 30M LRG OVAL (MISCELLANEOUS) IMPLANT
SNARE SNG USE RND 15MM (INSTRUMENTS) IMPLANT
SPOT EX ENDOSCOPIC TATTOO (MISCELLANEOUS)
TRAP ETRAP POLY (MISCELLANEOUS) IMPLANT
VARIJECT INJECTOR VIN23 (MISCELLANEOUS)
WATER STERILE IRR 250ML POUR (IV SOLUTION) ×2 IMPLANT

## 2015-10-14 NOTE — Anesthesia Procedure Notes (Signed)
Procedure Name: MAC Performed by: Kydan Shanholtzer Pre-anesthesia Checklist: Patient identified, Emergency Drugs available, Suction available, Patient being monitored and Timeout performed Patient Re-evaluated:Patient Re-evaluated prior to inductionOxygen Delivery Method: Nasal cannula       

## 2015-10-14 NOTE — H&P (Signed)
[facility]  [address] [place],  [address] Phone: [phone] Fax : [phone]  Primary Care Physician:  Bobetta Lime, MD Primary Gastroenterologist:  Dr. Allen Norris  Pre-Procedure History & Physical: HPI:  Bridget Reed is a 55 y.o. female is here for an colonoscopy.   Past Medical History  Diagnosis Date  . Allergy   . Hypertension   . GERD (gastroesophageal reflux disease)   . Left tennis elbow     Past Surgical History  Procedure Laterality Date  . Colonoscopy      Prior to Admission medications   Medication Sig Start Date End Date Taking? Authorizing Provider  hydrochlorothiazide (HYDRODIURIL) 12.5 MG tablet TAKE 1 BY MOUTH DAILY FOR HYPERTENSION (GENERIC FOR HYDRODIURIL) 07/03/15  Yes Bobetta Lime, MD  lisinopril (PRINIVIL,ZESTRIL) 20 MG tablet TAKE 1 BY MOUTH DAILY 07/03/15  Yes Bobetta Lime, MD  Loratadine-Pseudoephedrine (KLS ALLERCLEAR D-24HR PO) Take 1 tablet by mouth daily.     Yes Historical Provider, MD  meloxicam (MOBIC) 15 MG tablet Take 15 mg by mouth daily as needed for pain.   Yes Historical Provider, MD  NONFORMULARY OR COMPOUNDED [facility] compound:  Onychomycosis Nail Lacquer - Fluconazole 2%, Terbinafine 1%, DMSO, dispense 120 grams, apply to affected area 1-2 times 3-4 times daily, +2refills. 08/16/15  Yes Trula Slade, DPM  omeprazole (PRILOSEC) 20 MG capsule TAKE 1 BY MOUTH DAILY 07/16/15  Yes Bobetta Lime, MD  Probiotic Product (PROBIOTIC DAILY PO) Take by mouth.   Yes Historical Provider, MD    Allergies as of 09/13/2015 - Review Complete 08/15/2015  Allergen Reaction Noted  . Erythromycin  09/19/2010  . Influenza virus vaccine split  06/12/2011  . Azithromycin Rash 07/16/2015    Family History  Problem Relation Age of Onset  . COPD Mother   . Heart failure Mother   . Pancreatic cancer Mother   . Cancer Mother   . Osteopenia Mother   . Cancer Father   . Kidney cancer Father   .  Hypertension Father   . Cancer Paternal Aunt     colon and breast cancer in 2 aunts  . Breast cancer Paternal Aunt 38  . Breast cancer Cousin     Social History   Social History  . Marital Status: Married    Spouse Name: N/A  . Number of Children: N/A  . Years of Education: N/A   Occupational History  . Not on file.   Social History Main Topics  . Smoking status: Never Smoker   . Smokeless tobacco: Not on file  . Alcohol Use: 5.4 oz/week    9 Glasses of wine per week  . Drug Use: No  . Sexual Activity: Not on file   Other Topics Concern  . Not on file   Social History Narrative   Diet: moderate.. Working on improving .   Walks 3-4 times a week.     Review of Systems: See HPI, otherwise negative ROS  Physical Exam: BP 158/79 mmHg  Pulse 89  Temp(Src) 98.1 F (36.7 C)  Resp 16  Ht 5\' 2"  (1.575 m)  Wt 228 lb (103.42 kg)  BMI 41.69 kg/m2  SpO2 98%  LMP 11/22/2013 (Approximate) General:   Alert,  pleasant and cooperative in NAD Head:  Normocephalic and atraumatic. Neck:  Supple; no masses or thyromegaly. Lungs:  Clear throughout to auscultation.    Heart:  Regular rate and rhythm. Abdomen:  Soft, nontender and nondistended. Normal bowel sounds, without guarding, and  without rebound.   Neurologic:  Alert and  oriented x4;  grossly normal neurologically.  Impression/Plan: Bridget Reed is here for an colonoscopy to be performed for history of colon polyp  Risks, benefits, limitations, and alternatives regarding  colonosocpy have been reviewed with the patient.  Questions have been answered.  All parties agreeable.   Lucilla Lame, MD  10/14/2015, 10:43 AM

## 2015-10-14 NOTE — Anesthesia Preprocedure Evaluation (Signed)
Anesthesia Evaluation  Patient identified by MRN, date of birth, ID band Patient awake    Reviewed: Allergy & Precautions, H&P , NPO status   Airway Mallampati: II  TM Distance: >3 FB Neck ROM: full    Dental   Pulmonary    breath sounds clear to auscultation       Cardiovascular hypertension, Normal cardiovascular exam     Neuro/Psych    GI/Hepatic GERD  ,  Endo/Other    Renal/GU      Musculoskeletal   Abdominal   Peds  Hematology   Anesthesia Other Findings   Reproductive/Obstetrics                             Anesthesia Physical Anesthesia Plan  ASA: III  Anesthesia Plan: MAC   Post-op Pain Management:    Induction:   Airway Management Planned:   Additional Equipment:   Intra-op Plan:   Post-operative Plan:   Informed Consent: I have reviewed the patients History and Physical, chart, labs and discussed the procedure including the risks, benefits and alternatives for the proposed anesthesia with the patient or authorized representative who has indicated his/her understanding and acceptance.     Plan Discussed with: CRNA  Anesthesia Plan Comments:         Anesthesia Quick Evaluation

## 2015-10-14 NOTE — Anesthesia Postprocedure Evaluation (Signed)
Anesthesia Post Note  Patient: Araiah Krenz  Procedure(s) Performed: Procedure(s) (LRB): COLONOSCOPY WITH PROPOFOL (N/A)  Patient location during evaluation: PACU Anesthesia Type: MAC Level of consciousness: awake and alert Pain management: pain level controlled Vital Signs Assessment: post-procedure vital signs reviewed and stable Respiratory status: spontaneous breathing, nonlabored ventilation, respiratory function stable and patient connected to nasal cannula oxygen Cardiovascular status: stable and blood pressure returned to baseline Anesthetic complications: no    Amaryllis Dyke

## 2015-10-14 NOTE — Op Note (Signed)
[facility] Gastroenterology Patient Name: Bridget Reed Procedure Date: 10/14/2015 10:39 AM MRN: [id number] Account #: [id number] Date of Birth: 1961-04-30 Admit Type: Outpatient Age: 55 Room: [facility] OR ROOM 01 Gender: Female Note Status: Finalized Procedure:            Colonoscopy Indications:          High risk colon cancer surveillance: Personal history                        of colonic polyps Providers:            Lucilla Lame, MD Referring MD:         Bobetta Lime (Referring MD) Medicines:            Propofol per Anesthesia Complications:        No immediate complications. Procedure:            Pre-Anesthesia Assessment:                       - Prior to the procedure, a History and Physical was                        performed, and patient medications and allergies were                        reviewed. The patient's tolerance of previous                        anesthesia was also reviewed. The risks and benefits of                        the procedure and the sedation options and risks were                        discussed with the patient. All questions were                        answered, and informed consent was obtained. Prior                        Anticoagulants: The patient has taken no previous                        anticoagulant or antiplatelet agents. ASA Grade                        Assessment: II - A patient with mild systemic disease.                        After reviewing the risks and benefits, the patient was                        deemed in satisfactory condition to undergo the                        procedure.                       After obtaining informed consent, the colonoscope was  passed under direct vision. Throughout the procedure,                        the patient's blood pressure, pulse, and oxygen                        saturations were monitored continuously. The Olympus CF                        H180AL  colonoscope (S#: [id number]) was introduced through                        the anus and advanced to the the cecum, identified by                        appendiceal orifice and ileocecal valve. The                        colonoscopy was performed without difficulty. The                        patient tolerated the procedure well. The quality of                        the bowel preparation was excellent. Findings:      The perianal and digital rectal examinations were normal.      Non-bleeding internal hemorrhoids were found during retroflexion. The       hemorrhoids were Grade II (internal hemorrhoids that prolapse but reduce       spontaneously). Impression:           - Non-bleeding internal hemorrhoids.                       - No specimens collected. Recommendation:       - Repeat colonoscopy in 5 years for surveillance. Procedure Code(s):    --- Professional ---                       [phone], Colonoscopy, flexible; diagnostic, including                        collection of specimen(s) by brushing or washing, when                        performed (separate procedure) Diagnosis Code(s):    --- Professional ---                       Z86.010, Personal history of colonic polyps CPT copyright 2016 [employer]. All rights reserved. The codes documented in this report are preliminary and upon coder review may  be revised to meet current compliance requirements. Lucilla Lame, MD 10/14/2015 11:08:36 AM This report has been signed electronically. Number of Addenda: 0 Note Initiated On: 10/14/2015 10:39 AM Scope Withdrawal Time: 0 hours 5 minutes 56 seconds  Total Procedure Duration: 0 hours 9 minutes 19 seconds       [facility]

## 2015-10-14 NOTE — Transfer of Care (Signed)
Immediate Anesthesia Transfer of Care Note  Patient: Bridget Reed  Procedure(s) Performed: Procedure(s): COLONOSCOPY WITH PROPOFOL (N/A)  Patient Location: PACU  Anesthesia Type: MAC  Level of Consciousness: awake, alert  and patient cooperative  Airway and Oxygen Therapy: Patient Spontanous Breathing and Patient connected to supplemental oxygen  Post-op Assessment: Post-op Vital signs reviewed, Patient's Cardiovascular Status Stable, Respiratory Function Stable, Patent Airway and No signs of Nausea or vomiting  Post-op Vital Signs: Reviewed and stable  Complications: No apparent anesthesia complications

## 2015-10-15 ENCOUNTER — Encounter: Payer: Self-pay | Admitting: Gastroenterology

## 2015-12-04 ENCOUNTER — Other Ambulatory Visit: Payer: Self-pay

## 2015-12-04 DIAGNOSIS — Z114 Encounter for screening for human immunodeficiency virus [HIV]: Secondary | ICD-10-CM

## 2015-12-04 DIAGNOSIS — Z1159 Encounter for screening for other viral diseases: Secondary | ICD-10-CM | POA: Insufficient documentation

## 2015-12-04 DIAGNOSIS — Z0000 Encounter for general adult medical examination without abnormal findings: Secondary | ICD-10-CM | POA: Insufficient documentation

## 2015-12-04 MED ORDER — HYDROCHLOROTHIAZIDE 12.5 MG PO TABS: 12.5000 mg | ORAL_TABLET | Freq: Every day | ORAL | Status: DC

## 2015-12-04 NOTE — Telephone Encounter (Signed)
Please let patient know that I'll get her a few of the HCTZ pills locally, but we really really need her to get her labs done; there are no labs on her chart for over a year and I have to make sure her electrolytes and kidneys are okay with this drug Dr. Allie Dimmer orders have now expired, so I'll enter new ones

## 2015-12-05 NOTE — Telephone Encounter (Signed)
Pt states had labs in feb. Will fax report

## 2015-12-09 ENCOUNTER — Other Ambulatory Visit: Payer: Self-pay

## 2015-12-09 DIAGNOSIS — K219 Gastro-esophageal reflux disease without esophagitis: Secondary | ICD-10-CM

## 2015-12-09 MED ORDER — LISINOPRIL 20 MG PO TABS: 20.0000 mg | ORAL_TABLET | Freq: Every day | ORAL | Status: DC

## 2015-12-09 MED ORDER — OMEPRAZOLE 20 MG PO CPDR: 20.0000 mg | DELAYED_RELEASE_CAPSULE | Freq: Every day | ORAL | Status: DC | PRN

## 2015-12-09 NOTE — Telephone Encounter (Signed)
Patient labs were faxed last week please review and refill blood pressure meds

## 2015-12-09 NOTE — Telephone Encounter (Signed)
I looked under lab section and media section and don't see them I'll check other incoming papers

## 2015-12-09 NOTE — Telephone Encounter (Signed)
I found the labs; GFR 96, creatinine 0.72; K+ 4.4; ACE-I approved Please let patient know that I sent her prescriptions We want to warn her about the risks, though, of long-term use of PPIs like the omeprazole she is taking Prolonged use may increase risk of pneumonia, colitis, osteoporosis, anemia, and there are questions of it being linked to heart disease and renal disease I'll welcome an appt to discuss if she wishes

## 2015-12-10 NOTE — Telephone Encounter (Signed)
Left vm

## 2015-12-16 ENCOUNTER — Encounter: Payer: Self-pay | Admitting: Family Medicine

## 2016-01-01 ENCOUNTER — Other Ambulatory Visit: Payer: Self-pay

## 2016-01-02 MED ORDER — HYDROCHLOROTHIAZIDE 12.5 MG PO TABS: 12.5000 mg | ORAL_TABLET | Freq: Every day | ORAL | 1 refills | Status: DC

## 2016-01-02 NOTE — Telephone Encounter (Signed)
Creatinine and -lytes checked Feb 2017, reviewed Rx approved

## 2016-01-24 DIAGNOSIS — M9903 Segmental and somatic dysfunction of lumbar region: Secondary | ICD-10-CM | POA: Diagnosis not present

## 2016-01-24 DIAGNOSIS — M9902 Segmental and somatic dysfunction of thoracic region: Secondary | ICD-10-CM | POA: Diagnosis not present

## 2016-01-24 DIAGNOSIS — M9901 Segmental and somatic dysfunction of cervical region: Secondary | ICD-10-CM | POA: Diagnosis not present

## 2016-02-20 ENCOUNTER — Ambulatory Visit (INDEPENDENT_AMBULATORY_CARE_PROVIDER_SITE_OTHER): Payer: BLUE CROSS/BLUE SHIELD

## 2016-02-20 ENCOUNTER — Encounter: Payer: Self-pay | Admitting: Podiatry

## 2016-02-20 ENCOUNTER — Ambulatory Visit (INDEPENDENT_AMBULATORY_CARE_PROVIDER_SITE_OTHER): Payer: BLUE CROSS/BLUE SHIELD | Admitting: Podiatry

## 2016-02-20 DIAGNOSIS — R52 Pain, unspecified: Secondary | ICD-10-CM | POA: Diagnosis not present

## 2016-02-20 DIAGNOSIS — M7730 Calcaneal spur, unspecified foot: Secondary | ICD-10-CM | POA: Diagnosis not present

## 2016-02-20 DIAGNOSIS — M7662 Achilles tendinitis, left leg: Secondary | ICD-10-CM

## 2016-02-20 DIAGNOSIS — M7661 Achilles tendinitis, right leg: Secondary | ICD-10-CM

## 2016-02-20 NOTE — Progress Notes (Signed)
Subjective: 55 year old female presents the also concerns of pain in the back of her heel which is been ongoing approximately 2 months. She states that in the morning and she gets up she has stiffness as well as pain and back of her heel. She states that she with the heel does feel better. She denies any recent injury or trauma. No swelling or redness. No numbness or tingling. The pain does not wake her up at night. No recent injury or trauma. No other complaints at this time.  Objective: AAO x3, NAD DP/PT pulses palpable bilaterally, CRT less than 3 seconds There is tenderness palpation to the posterior aspect of bilateral heels on the insertion of the Achilles tendon. Retrocalcaneal exostosis present. There is no defect noted within the Achilles tendon [place] Silos test is negative. There is no overlying edema, erythema, increase in warmth. No pain with lateral compression of the calcaneus. Equinus is present. No other areas of tenderness.  No open lesions or pre-ulcerative lesions.  No pain with calf compression, swelling, warmth, erythema  Assessment: Retrocalcaneal exostosis, Achilles tendonitis bilaterally  Plan: -All treatment options discussed with the patient including all alternatives, risks, complications.  -X-rays obtained and reviewed with the patient. Retrocalcaneal exostosis present. No evidence of acute fracture. -Stretching exercises daily. Discussed supportive shoe gear. Heels lifts were dispensed. Night splint dispensed. Mobic -Patient encouraged to call the office with any questions, concerns, change in symptoms.   Celesta Gentile, DPM

## 2016-02-20 NOTE — Patient Instructions (Signed)

## 2016-04-09 ENCOUNTER — Encounter: Payer: Self-pay | Admitting: Podiatry

## 2016-04-09 ENCOUNTER — Ambulatory Visit (INDEPENDENT_AMBULATORY_CARE_PROVIDER_SITE_OTHER): Payer: BLUE CROSS/BLUE SHIELD | Admitting: Podiatry

## 2016-04-09 DIAGNOSIS — M7730 Calcaneal spur, unspecified foot: Secondary | ICD-10-CM | POA: Diagnosis not present

## 2016-04-09 DIAGNOSIS — M7662 Achilles tendinitis, left leg: Secondary | ICD-10-CM

## 2016-04-09 DIAGNOSIS — M7661 Achilles tendinitis, right leg: Secondary | ICD-10-CM | POA: Diagnosis not present

## 2016-04-13 ENCOUNTER — Telehealth: Payer: Self-pay | Admitting: *Deleted

## 2016-04-13 MED ORDER — EFINACONAZOLE 10 % EX SOLN: 1.0000 [drp] | Freq: Every day | CUTANEOUS | 11 refills | Status: DC

## 2016-04-13 NOTE — Progress Notes (Addendum)
Subjective: 55 year old female presents to the office today for follow-up evaluation of bilateral heel pain, Achilles tendinitis. She states that overall he had improved however she still gets pain on daily basis to this area on both sides. She denies any swelling or redness. She still having stiffness to the back of her heel when she first stands up. No recent injury or trauma. She has been stretching, icing as well as using the night splint and anti-inflammatories as needed.   Objective: AAO x3, NAD DP/PT pulses palpable bilaterally, CRT less than 3 seconds There is continued tenderness palpation to the posterior aspect of bilateral heels on the insertion of the Achilles tendon. Retrocalcaneal exostosis present. There is no defect noted within the Achilles tendon [place] Silos test is negative. There is no overlying edema, erythema, increase in warmth. No pain with lateral compression of the calcaneus. Equinus is present. No other areas of tenderness.  No open lesions or pre-ulcerative lesions. Her nails to continue to be dystrophic, discolored, hypertrophic. There is no pain with tenderness there is no surrounding redness or drainage.  No pain with calf compression, swelling, warmth, erythema  Assessment: Retrocalcaneal exostosis, Achilles tendonitis bilaterally with some improvement but does continue.   Plan: -All treatment options discussed with the patient including all alternatives, risks, complications.  -At this time I recommended physical therapy. Prescriptions provided for her today. Continue stretching, icing and home as well as a night splint. I also discussed shoe gear modifications and orthotics. Meloxicam as needed. -Will switch to Jublia for nail fungus.  -Patient encouraged to call the office with any questions, concerns, change in symptoms.   Celesta Gentile, DPM

## 2016-04-13 NOTE — Telephone Encounter (Addendum)
-----   Message from Trula Slade, DPM sent at 04/13/2016  7:23 AM EST ----- Can you please order jublia for her or let me know what pharmacy to send it to and I'll take care of it. Faxed Jublia rx to [facility].

## 2016-05-29 ENCOUNTER — Ambulatory Visit: Payer: BLUE CROSS/BLUE SHIELD | Admitting: Podiatry

## 2016-06-03 ENCOUNTER — Ambulatory Visit (INDEPENDENT_AMBULATORY_CARE_PROVIDER_SITE_OTHER): Payer: Self-pay

## 2016-06-03 DIAGNOSIS — B351 Tinea unguium: Secondary | ICD-10-CM

## 2016-06-03 NOTE — Patient Instructions (Signed)

## 2016-06-03 NOTE — Progress Notes (Signed)
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 All safety precautions were in place. Re-appointed in 1 month for 2nd of 4-6 treatments

## 2016-06-19 DIAGNOSIS — M9902 Segmental and somatic dysfunction of thoracic region: Secondary | ICD-10-CM | POA: Diagnosis not present

## 2016-06-19 DIAGNOSIS — M9901 Segmental and somatic dysfunction of cervical region: Secondary | ICD-10-CM | POA: Diagnosis not present

## 2016-06-19 DIAGNOSIS — M531 Cervicobrachial syndrome: Secondary | ICD-10-CM | POA: Diagnosis not present

## 2016-07-01 ENCOUNTER — Ambulatory Visit: Payer: BLUE CROSS/BLUE SHIELD

## 2016-07-01 DIAGNOSIS — B351 Tinea unguium: Secondary | ICD-10-CM

## 2016-07-01 MED ORDER — MELOXICAM 15 MG PO TABS: 15.0000 mg | ORAL_TABLET | Freq: Every day | ORAL | 1 refills | Status: DC | PRN

## 2016-07-03 NOTE — Progress Notes (Signed)
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 All safety precautions were in place. Re-appointed in 1 month for 3rd of 4-6 treatments

## 2016-07-06 ENCOUNTER — Other Ambulatory Visit: Payer: Self-pay | Admitting: Family Medicine

## 2016-07-06 NOTE — Telephone Encounter (Signed)
PT HAS AN APPT NEXT Monday FEB 19 BUT WILL BE OUT OF HER HYDROCHLOROTHIAZIDE 12.5 MG. PATIENT IS ASKING FOR 2 WK SUPPLY AND NEEDS THIS TO GO TO [facility] [address] AND THEN HER RX FOR WHEN SHE COMES IN ON APPT DAY TO GO TO HER MAIL ORDER.

## 2016-07-07 ENCOUNTER — Other Ambulatory Visit: Payer: Self-pay

## 2016-07-07 MED ORDER — HYDROCHLOROTHIAZIDE 12.5 MG PO TABS: 12.5000 mg | ORAL_TABLET | Freq: Every day | ORAL | 0 refills | Status: DC

## 2016-07-07 NOTE — Addendum Note (Signed)
Addended by: Docia Furl on: 07/07/2016 10:07 AM   Modules accepted: Orders

## 2016-07-07 NOTE — Addendum Note (Signed)
Addended by: Ronae Noell, Satira Anis on: 07/07/2016 10:56 AM   Modules accepted: Orders

## 2016-07-07 NOTE — Telephone Encounter (Signed)
Patient only wants 2 week supply until her appt and then when she come in will do 90 day and send to mail order.

## 2016-07-07 NOTE — Telephone Encounter (Signed)
approved

## 2016-07-13 ENCOUNTER — Other Ambulatory Visit: Payer: Self-pay | Admitting: Family Medicine

## 2016-07-13 ENCOUNTER — Encounter: Payer: Self-pay | Admitting: Family Medicine

## 2016-07-13 ENCOUNTER — Ambulatory Visit (INDEPENDENT_AMBULATORY_CARE_PROVIDER_SITE_OTHER): Payer: BLUE CROSS/BLUE SHIELD | Admitting: Family Medicine

## 2016-07-13 ENCOUNTER — Other Ambulatory Visit: Payer: Self-pay

## 2016-07-13 VITALS — BP 132/84 | HR 98 | Temp 98.5°F | Resp 14 | Ht 61.2 in | Wt 235.9 lb

## 2016-07-13 DIAGNOSIS — I10 Essential (primary) hypertension: Secondary | ICD-10-CM | POA: Diagnosis not present

## 2016-07-13 DIAGNOSIS — Z0000 Encounter for general adult medical examination without abnormal findings: Secondary | ICD-10-CM | POA: Diagnosis not present

## 2016-07-13 DIAGNOSIS — Z1231 Encounter for screening mammogram for malignant neoplasm of breast: Secondary | ICD-10-CM

## 2016-07-13 DIAGNOSIS — Z1239 Encounter for other screening for malignant neoplasm of breast: Secondary | ICD-10-CM | POA: Insufficient documentation

## 2016-07-13 DIAGNOSIS — Z124 Encounter for screening for malignant neoplasm of cervix: Secondary | ICD-10-CM | POA: Insufficient documentation

## 2016-07-13 DIAGNOSIS — Z862 Personal history of diseases of the blood and blood-forming organs and certain disorders involving the immune mechanism: Secondary | ICD-10-CM | POA: Diagnosis not present

## 2016-07-13 DIAGNOSIS — K219 Gastro-esophageal reflux disease without esophagitis: Secondary | ICD-10-CM

## 2016-07-13 HISTORY — DX: Morbid (severe) obesity due to excess calories: E66.01

## 2016-07-13 NOTE — Assessment & Plan Note (Signed)
Noted in problem list; will put in orders for CBC; colonoscopy UTD

## 2016-07-13 NOTE — Assessment & Plan Note (Signed)
Encouragement given; journey; see AVS

## 2016-07-13 NOTE — Assessment & Plan Note (Signed)
USPSTF grade A and B recommendations reviewed with patient; age-appropriate recommendations, preventive care, screening tests, etc discussed and encouraged; healthy living encouraged; see AVS for patient education given to patient  

## 2016-07-13 NOTE — Assessment & Plan Note (Signed)
SBE monthly, mammograms yearly

## 2016-07-13 NOTE — Assessment & Plan Note (Signed)
DASH guidelines, weight loss 

## 2016-07-13 NOTE — Patient Instructions (Addendum)
Consider Shingrix (shingles vaccine) Have fasting labs done at your convenience If you have not heard anything from my staff in a week about any orders/referrals/studies from today, please contact us here to follow-up [phone]  Check out the information at [email or web address] entitled "Nutrition for Weight Loss: What You Need to Know about Fad Diets" Try to lose between 1-2 pounds per week by taking in fewer calories and burning off more calories You can succeed by limiting portions, limiting foods dense in calories and fat, becoming more active, and drinking 8 glasses of water a day (64 ounces) Don't skip meals, especially breakfast, as skipping meals may alter your metabolism Do not use over-the-counter weight loss pills or gimmicks that claim rapid weight loss A healthy BMI (or body mass index) is between 18.5 and 24.9 You can calculate your ideal BMI at the [place] website ClubMon[employer]fr  Your goal blood pressure is less than 140 mmHg on top. Try to follow the DASH guidelines (DASH stands for Dietary Approaches to Stop Hypertension) Try to limit the sodium in your diet.  Ideally, consume less than 1.5 grams (less than 1,546m) per day. Do not add salt when cooking or at the table.  Check the sodium amount on labels when shopping, and choose items lower in sodium when given a choice. Avoid or limit foods that already contain a lot of sodium. Eat a diet rich in fruits and vegetables and whole grains.  DASH Eating Plan DASH stands for "Dietary Approaches to Stop Hypertension." The DASH eating plan is a healthy eating plan that has been shown to reduce high blood pressure (hypertension). Additional health benefits may include reducing the risk of type 2 diabetes mellitus, heart disease, and stroke. The DASH eating plan may also help with weight loss. What do I need to know about the DASH eating plan? For the DASH eating plan, you will follow  these general guidelines:  Choose foods with less than 150 milligrams of sodium per serving (as listed on the food label).  Use salt-free seasonings or herbs instead of table salt or sea salt.  Check with your health care provider or pharmacist before using salt substitutes.  Eat lower-sodium products. These are often labeled as "low-sodium" or "no salt added."  Eat fresh foods. Avoid eating a lot of canned foods.  Eat more vegetables, fruits, and low-fat dairy products.  Choose whole grains. Look for the word "whole" as the first word in the ingredient list.  Choose fish and skinless chicken or [place] often than red meat. Limit fish, poultry, and meat to 6 oz (170 g) each day.  Limit sweets, desserts, sugars, and sugary drinks.  Choose heart-healthy fats.  Eat more home-cooked food and less restaurant, buffet, and fast food.  Limit fried foods.  Do not fry foods. Cook foods using methods such as baking, boiling, grilling, and broiling instead.  When eating at a restaurant, ask that your food be prepared with less salt, or no salt if possible. What foods can I eat? Seek help from a dietitian for individual calorie needs. Grains  Whole grain or whole wheat bread. Brown rice. Whole grain or whole wheat pasta. Quinoa, bulgur, and whole grain cereals. Low-sodium cereals. Corn or whole wheat flour tortillas. Whole grain cornbread. Whole grain crackers. Low-sodium crackers. Vegetables  Fresh or frozen vegetables (raw, steamed, roasted, or grilled). Low-sodium or reduced-sodium tomato and vegetable juices. Low-sodium or reduced-sodium tomato sauce and paste. Low-sodium or reduced-sodium canned vegetables. Fruits  All fresh, canned (in  natural juice), or frozen fruits. Meat and Other Protein Products  Ground beef (85% or leaner), grass-fed beef, or beef trimmed of fat. Skinless chicken or [place]. Ground chicken or [place]. Pork trimmed of fat. All fish and seafood. Eggs. Dried  beans, peas, or lentils. Unsalted nuts and seeds. Unsalted canned beans. Dairy  Low-fat dairy products, such as skim or 1% milk, 2% or reduced-fat cheeses, low-fat ricotta or cottage cheese, or plain low-fat yogurt. Low-sodium or reduced-sodium cheeses. Fats and Oils  Tub margarines without trans fats. Light or reduced-fat mayonnaise and salad dressings (reduced sodium). Avocado. Safflower, olive, or canola oils. Natural peanut or almond butter. Other  Unsalted popcorn and pretzels. The items listed above may not be a complete list of recommended foods or beverages. Contact your dietitian for more options.  What foods are not recommended? Grains  White bread. White pasta. White rice. Refined cornbread. Bagels and croissants. Crackers that contain trans fat. Vegetables  Creamed or fried vegetables. Vegetables in a cheese sauce. Regular canned vegetables. Regular canned tomato sauce and paste. Regular tomato and vegetable juices. Fruits  Canned fruit in light or heavy syrup. Fruit juice. Meat and Other Protein Products  Fatty cuts of meat. Ribs, chicken wings, bacon, sausage, bologna, salami, chitterlings, fatback, hot dogs, bratwurst, and packaged luncheon meats. Salted nuts and seeds. Canned beans with salt. Dairy  Whole or 2% milk, cream, half-and-half, and cream cheese. Whole-fat or sweetened yogurt. Full-fat cheeses or blue cheese. Nondairy creamers and whipped toppings. Processed cheese, cheese spreads, or cheese curds. Condiments  Onion and garlic salt, seasoned salt, table salt, and sea salt. Canned and packaged gravies. Worcestershire sauce. Tartar sauce. Barbecue sauce. Teriyaki sauce. Soy sauce, including reduced sodium. Steak sauce. Fish sauce. Oyster sauce. Cocktail sauce. Horseradish. Ketchup and mustard. Meat flavorings and tenderizers. Bouillon cubes. Hot sauce. Tabasco sauce. Marinades. Taco seasonings. Relishes. Fats and Oils  Butter, stick margarine, lard, shortening, ghee, and  bacon fat. Coconut, palm kernel, or palm oils. Regular salad dressings. Other  Pickles and olives. Salted popcorn and pretzels. The items listed above may not be a complete list of foods and beverages to avoid. Contact your dietitian for more information.  Where can I find more information? National Heart, Lung, and Blood Institute: travelstabloid.com This information is not intended to replace advice given to you by your health care provider. Make sure you discuss any questions you have with your health care provider. Document Released: 04/30/2011 Document Revised: 10/17/2015 Document Reviewed: 03/15/2013 Elsevier Interactive Patient Education  2017 [employer] Maintenance, Female Introduction Adopting a healthy lifestyle and getting preventive care can go a long way to promote health and wellness. Talk with your health care provider about what schedule of regular examinations is right for you. This is a good chance for you to check in with your provider about disease prevention and staying healthy. In between checkups, there are plenty of things you can do on your own. Experts have done a lot of research about which lifestyle changes and preventive measures are most likely to keep you healthy. Ask your health care provider for more information. Weight and diet Eat a healthy diet  Be sure to include plenty of vegetables, fruits, low-fat dairy products, and lean protein.  Do not eat a lot of foods high in solid fats, added sugars, or salt.  Get regular exercise. This is one of the most important things you can do for your health.  Most adults should exercise for at least 150 minutes each  week. The exercise should increase your heart rate and make you sweat (moderate-intensity exercise).  Most adults should also do strengthening exercises at least twice a week. This is in addition to the moderate-intensity exercise. Maintain a healthy weight  Body  mass index (BMI) is a measurement that can be used to identify possible weight problems. It estimates body fat based on height and weight. Your health care provider can help determine your BMI and help you achieve or maintain a healthy weight.  For females 3 years of age and older:  A BMI below 18.5 is considered underweight.  A BMI of 18.5 to 24.9 is normal.  A BMI of 25 to 29.9 is considered overweight.  A BMI of 30 and above is considered obese. Watch levels of cholesterol and blood lipids  You should start having your blood tested for lipids and cholesterol at 55 years of age, then have this test every 5 years.  You may need to have your cholesterol levels checked more often if:  Your lipid or cholesterol levels are high.  You are older than 55 years of age.  You are at high risk for heart disease. Cancer screening Lung Cancer  Lung cancer screening is recommended for adults 92-31 years old who are at high risk for lung cancer because of a history of smoking.  A yearly low-dose CT scan of the lungs is recommended for people who:  Currently smoke.  Have quit within the past 15 years.  Have at least a 30-pack-year history of smoking. A pack year is smoking an average of one pack of cigarettes a day for 1 year.  Yearly screening should continue until it has been 15 years since you quit.  Yearly screening should stop if you develop a health problem that would prevent you from having lung cancer treatment. Breast Cancer  Practice breast self-awareness. This means understanding how your breasts normally appear and feel.  It also means doing regular breast self-exams. Let your health care provider know about any changes, no matter how small.  If you are in your 20s or 30s, you should have a clinical breast exam (CBE) by a health care provider every 1-3 years as part of a regular health exam.  If you are 4 or older, have a CBE every year. Also consider having a breast  X-ray (mammogram) every year.  If you have a family history of breast cancer, talk to your health care provider about genetic screening.  If you are at high risk for breast cancer, talk to your health care provider about having an MRI and a mammogram every year.  Breast cancer gene (BRCA) assessment is recommended for women who have family members with BRCA-related cancers. BRCA-related cancers include:  Breast.  Ovarian.  Tubal.  Peritoneal cancers.  Results of the assessment will determine the need for genetic counseling and BRCA1 and BRCA2 testing. Cervical Cancer  Your health care provider may recommend that you be screened regularly for cancer of the pelvic organs (ovaries, uterus, and vagina). This screening involves a pelvic examination, including checking for microscopic changes to the surface of your cervix (Pap test). You may be encouraged to have this screening done every 3 years, beginning at age 64.  For women ages 73-65, health care providers may recommend pelvic exams and Pap testing every 3 years, or they may recommend the Pap and pelvic exam, combined with testing for human papilloma virus (HPV), every 5 years. Some types of HPV increase your risk of  cervical cancer. Testing for HPV may also be done on women of any age with unclear Pap test results.  Other health care providers may not recommend any screening for nonpregnant women who are considered low risk for pelvic cancer and who do not have symptoms. Ask your health care provider if a screening pelvic exam is right for you.  If you have had past treatment for cervical cancer or a condition that could lead to cancer, you need Pap tests and screening for cancer for at least 20 years after your treatment. If Pap tests have been discontinued, your risk factors (such as having a new sexual partner) need to be reassessed to determine if screening should resume. Some women have medical problems that increase the chance of  getting cervical cancer. In these cases, your health care provider may recommend more frequent screening and Pap tests. Colorectal Cancer  This type of cancer can be detected and often prevented.  Routine colorectal cancer screening usually begins at 55 years of age and continues through 55 years of age.  Your health care provider may recommend screening at an earlier age if you have risk factors for colon cancer.  Your health care provider may also recommend using home test kits to check for hidden blood in the stool.  A small camera at the end of a tube can be used to examine your colon directly (sigmoidoscopy or colonoscopy). This is done to check for the earliest forms of colorectal cancer.  Routine screening usually begins at age 35.  Direct examination of the colon should be repeated every 5-10 years through 55 years of age. However, you may need to be screened more often if early forms of precancerous polyps or small growths are found. Skin Cancer  Check your skin from head to toe regularly.  Tell your health care provider about any new moles or changes in moles, especially if there is a change in a mole's shape or color.  Also tell your health care provider if you have a mole that is larger than the size of a pencil eraser.  Always use sunscreen. Apply sunscreen liberally and repeatedly throughout the day.  Protect yourself by wearing long sleeves, pants, a wide-brimmed hat, and sunglasses whenever you are outside. Heart disease, diabetes, and high blood pressure  High blood pressure causes heart disease and increases the risk of stroke. High blood pressure is more likely to develop in:  People who have blood pressure in the high end of the normal range (130-139/85-89 mm Hg).  People who are overweight or obese.  People who are African American.  If you are 97-25 years of age, have your blood pressure checked every 3-5 years. If you are 34 years of age or older, have your  blood pressure checked every year. You should have your blood pressure measured twice-once when you are at a hospital or clinic, and once when you are not at a hospital or clinic. Record the average of the two measurements. To check your blood pressure when you are not at a hospital or clinic, you can use:  An automated blood pressure machine at a pharmacy.  A home blood pressure monitor.  If you are between 9 years and 64 years old, ask your health care provider if you should take aspirin to prevent strokes.  Have regular diabetes screenings. This involves taking a blood sample to check your fasting blood sugar level.  If you are at a normal weight and have a low risk  for diabetes, have this test once every three years after 55 years of age.  If you are overweight and have a high risk for diabetes, consider being tested at a younger age or more often. Preventing infection Hepatitis B  If you have a higher risk for hepatitis B, you should be screened for this virus. You are considered at high risk for hepatitis B if:  You were born in a country where hepatitis B is common. Ask your health care provider which countries are considered high risk.  Your parents were born in a high-risk country, and you have not been immunized against hepatitis B (hepatitis B vaccine).  You have HIV or AIDS.  You use needles to inject street drugs.  You live with someone who has hepatitis B.  You have had sex with someone who has hepatitis B.  You get hemodialysis treatment.  You take certain medicines for conditions, including cancer, organ transplantation, and autoimmune conditions. Hepatitis C  Blood testing is recommended for:  Everyone born from [birth year] through 1964.  Anyone with known risk factors for hepatitis C. Sexually transmitted infections (STIs)  You should be screened for sexually transmitted infections (STIs) including gonorrhea and chlamydia if:  You are sexually active and are  younger than 55 years of age.  You are older than 55 years of age and your health care provider tells you that you are at risk for this type of infection.  Your sexual activity has changed since you were last screened and you are at an increased risk for chlamydia or gonorrhea. Ask your health care provider if you are at risk.  If you do not have HIV, but are at risk, it may be recommended that you take a prescription medicine daily to prevent HIV infection. This is called pre-exposure prophylaxis (PrEP). You are considered at risk if:  You are sexually active and do not regularly use condoms or know the HIV status of your partner(s).  You take drugs by injection.  You are sexually active with a partner who has HIV. Talk with your health care provider about whether you are at high risk of being infected with HIV. If you choose to begin PrEP, you should first be tested for HIV. You should then be tested every 3 months for as long as you are taking PrEP. Pregnancy  If you are premenopausal and you may become pregnant, ask your health care provider about preconception counseling.  If you may become pregnant, take 400 to 800 micrograms (mcg) of folic acid every day.  If you want to prevent pregnancy, talk to your health care provider about birth control (contraception). Osteoporosis and menopause  Osteoporosis is a disease in which the bones lose minerals and strength with aging. This can result in serious bone fractures. Your risk for osteoporosis can be identified using a bone density scan.  If you are 91 years of age or older, or if you are at risk for osteoporosis and fractures, ask your health care provider if you should be screened.  Ask your health care provider whether you should take a calcium or vitamin D supplement to lower your risk for osteoporosis.  Menopause may have certain physical symptoms and risks.  Hormone replacement therapy may reduce some of these symptoms and  risks. Talk to your health care provider about whether hormone replacement therapy is right for you. Follow these instructions at home:  Schedule regular health, dental, and eye exams.  Stay current with your immunizations.  Do  not use any tobacco products including cigarettes, chewing tobacco, or electronic cigarettes.  If you are pregnant, do not drink alcohol.  If you are breastfeeding, limit how much and how often you drink alcohol.  Limit alcohol intake to no more than 1 drink per day for nonpregnant women. One drink equals 12 ounces of beer, 5 ounces of wine, or 1 ounces of hard liquor.  Do not use street drugs.  Do not share needles.  Ask your health care provider for help if you need support or information about quitting drugs.  Tell your health care provider if you often feel depressed.  Tell your health care provider if you have ever been abused or do not feel safe at home. This information is not intended to replace advice given to you by your health care provider. Make sure you discuss any questions you have with your health care provider. Document Released: 11/24/2010 Document Revised: 10/17/2015 Document Reviewed: 02/12/2015  2017 [employer]vier

## 2016-07-13 NOTE — Progress Notes (Signed)
Patient ID: Bridget Reed, female   DOB: 10-18-1960, 56 y.o.   MRN: [id number]   Subjective:   Bridget Reed is a 56 y.o. female here for a complete physical exam  Interim issues since last visit: no medical excitement  USPSTF grade A and B recommendations Alcohol: empty calories from wine; 6 a week Depression:  Depression screen [facility] 2/9 07/13/2016 07/16/2015  Decreased Interest 0 0  Down, Depressed, Hopeless 0 0  PHQ - 2 Score 0 0   Hypertension: controlled Obesity: BMI reviewed; working with husband, trying to not eat out as much Tobacco use: nonsmoker HIV, hep B, hep C: visually saw negative hep C Feb 2017 [place] STD testing and prevention (chl/gon/syphilis): declined Lipids: through wellness program Glucose: through wellness program Colorectal cancer:  Breast cancer: no lumps BRCA gene screening: aunt (paternal) breast cancer, recurrence 20 years later; maternal cousin had breast cancer at age 63, doing well; no ovarian cancer Intimate partner violence:no abuse Cervical cancer screening: today Lung cancer: nonsomker Osteoporosis: no steroids, mother had osteoporosis (smoker), pt nonsmoker; menopause July 2015 Fall prevention/vitamin D: spends time outdoors, multiple vitamin AAA: n/a Aspirin: check lipids and calculate, labs done through wellness Diet: getting enough calcium, vitamins, does some greens Exercise: no regular activity, tries  Skin cancer: no worrisome moles, sees dermatologist regularly; had a place on chest removed 2 years ago, done well Shingrix: discussed  Past Medical History:  Diagnosis Date  . Allergy   . GERD (gastroesophageal reflux disease)   . Hypertension   . Left tennis elbow   . Morbid obesity ([place]) 07/13/2016   Past Surgical History:  Procedure Laterality Date  . COLONOSCOPY    . COLONOSCOPY WITH PROPOFOL N/A 10/14/2015   Procedure: COLONOSCOPY WITH PROPOFOL;  Surgeon: Lucilla Lame, MD;  Location: [place];  Service:  Endoscopy;  Laterality: N/A;   Family History  Problem Relation Age of Onset  . COPD Mother   . Heart failure Mother   . Pancreatic cancer Mother   . Cancer Mother     pancretic  . Osteopenia Mother   . Cancer Father     lung/kidney  . Kidney cancer Father   . Hypertension Father   . Cancer Paternal Aunt     colon and breast cancer in 2 aunts  . Breast cancer Paternal Aunt 55  . Breast cancer Cousin   . Hypertension Brother   . Atrial fibrillation Brother   . Heart disease Maternal Grandmother   . Cancer Maternal Grandfather     lukemia   Social History  Substance Use Topics  . Smoking status: Never Smoker  . Smokeless tobacco: Never Used  . Alcohol use 5.4 oz/week    9 Glasses of wine per week   Review of Systems  Objective:   Vitals:   07/13/16 1413  BP: 132/84  Pulse: 98  Resp: 14  Temp: 98.5 F (36.9 C)  TempSrc: Oral  SpO2: 96%  Weight: 235 lb 14.4 oz (107 kg)  Height: 5' 1.2" (1.554 m)   Body mass index is 44.28 kg/m. Wt Readings from Last 3 Encounters:  07/13/16 235 lb 14.4 oz (107 kg)  10/14/15 228 lb (103.4 kg)  07/16/15 232 lb 8 oz (105.5 kg)   Physical Exam  Constitutional: She appears well-developed and well-nourished. No distress.  Morbidly obese  HENT:  Head: Normocephalic and atraumatic.  Eyes: Conjunctivae and EOM are normal. Right eye exhibits no hordeolum. Left eye exhibits no hordeolum. No scleral icterus.  Neck: No thyromegaly present.  Cardiovascular: Normal rate, regular rhythm, S1 normal, S2 normal and normal heart sounds.   No extrasystoles are present.  Pulmonary/Chest: Effort normal and breath sounds normal. No respiratory distress. Right breast exhibits no inverted nipple, no mass, no nipple discharge, no skin change and no tenderness. Left breast exhibits no inverted nipple, no mass, no nipple discharge, no skin change and no tenderness. Breasts are symmetrical.  Abdominal: Soft. Normal appearance and bowel sounds are normal.  She exhibits no distension, no abdominal bruit, no pulsatile midline mass and no mass. There is no hepatosplenomegaly. There is no tenderness. No hernia.  Genitourinary: Uterus normal. Pelvic exam was performed with patient prone. There is no rash or lesion on the right labia. There is no rash or lesion on the left labia. Cervix exhibits no motion tenderness. Right adnexum displays no mass, no tenderness and no fullness. Left adnexum displays no mass, no tenderness and no fullness.  Musculoskeletal: Normal range of motion. She exhibits no edema.  Lymphadenopathy:       Head (right side): No submandibular adenopathy present.       Head (left side): No submandibular adenopathy present.    She has no cervical adenopathy.    She has no axillary adenopathy.  Neurological: She is alert. She displays no tremor. No cranial nerve deficit. She exhibits normal muscle tone. Gait normal.  Skin: Skin is warm and dry. No bruising and no ecchymosis noted. No cyanosis. No pallor.  Psychiatric: Her speech is normal and behavior is normal. Thought content normal. Her mood appears not anxious. She does not exhibit a depressed mood.    Assessment/Plan:   Problem List Items Addressed This Visit      Cardiovascular and Mediastinum   HTN (hypertension), benign    DASH guidelines, weight loss        Other   Screening for cervical cancer    Pap smear done today      Relevant Orders   Pap IG and HPV (high risk) DNA detection   Screening for breast cancer    SBE monthly, mammograms yearly      Relevant Orders   MM DIGITAL SCREENING BILATERAL   Morbid obesity ([place])    Encouragement given; journey; see AVS      History of iron deficiency anemia    Noted in problem list; will put in orders for CBC; colonoscopy UTD      Annual physical exam - Primary    USPSTF grade A and B recommendations reviewed with patient; age-appropriate recommendations, preventive care, screening tests, etc discussed and  encouraged; healthy living encouraged; see AVS for patient education given to patient      Relevant Orders   Lipid panel   CBC with Differential/Platelet   Comprehensive metabolic panel   TSH       Meds ordered this encounter  Medications  . triamcinolone (NASACORT) 55 MCG/ACT AERO nasal inhaler    Sig: Place 1 spray into the nose as needed.   . cetirizine (ZYRTEC) 10 MG tablet    Sig: Take 10 mg by mouth as needed for allergies.   Orders Placed This Encounter  Procedures  . MM DIGITAL SCREENING BILATERAL    Order Specific Question:   Reason for Exam (SYMPTOM  OR DIAGNOSIS REQUIRED)    Answer:   screening    Order Specific Question:   Preferred imaging location?    Answer:   [facility]  . Lipid panel  . CBC with Differential/Platelet  .  Comprehensive metabolic panel  . TSH    Follow up plan: Return in about 1 year (around 07/13/2017) for complete physical.  An After Visit Summary was printed and given to the patient.

## 2016-07-13 NOTE — Telephone Encounter (Signed)
Needs 90 day supply 

## 2016-07-13 NOTE — Assessment & Plan Note (Signed)
Pap smear done today

## 2016-07-15 LAB — PAP IG AND HPV HIGH-RISK: HPV DNA HIGH RISK: NOT DETECTED

## 2016-07-15 NOTE — Telephone Encounter (Signed)
Left message; need to talk with her about the PPI ---------------------------- Chart reviewed further Please call patient Let her know that I don't recommend she continue the omeprazole unless she has Barrett's; she is welcome to contact her GI doctor if she needs to take a PPI long-term  This drug has risks if taken long-term Prolonged use of proton pump inhibitors like omeprazole (Prilosec), can increase the risk of pneumonia, Clostridium difficile colitis, osteoporosis, anemia and other health complications (new implications with kidney disease, heart disease, and elevated liver enzymes) Try to limit or avoid triggers like coffee, caffeinated beverages, onions, chocolate, spicy foods, peppermint, acid foods like pizza, spaghetti sauce, and orange juice Lose weight if obese Try elevating the head of the bed by placing a small wedge between your mattress and box springs to keep acid in the stomach at night instead of coming up into your esophagus  I'll suggest ranitidine 150 mg BID if needed (I can send that to mail order if she wants it), but if symptoms are significant without the PPI, I'll recommend she see GI for consideration of endoscopy I need her to get her labs She has not had any labs done in over a year and these are needed to monitor her other medicines I can send in a 7 day supply, but we really really need lab results; please find out which local pharmacy she'd like temporary supply sent to and I'll send that in

## 2016-07-16 ENCOUNTER — Telehealth: Payer: Self-pay

## 2016-07-16 MED ORDER — HYDROCHLOROTHIAZIDE 12.5 MG PO TABS: 12.5000 mg | ORAL_TABLET | Freq: Every day | ORAL | 0 refills | Status: DC

## 2016-07-16 MED ORDER — RANITIDINE HCL 150 MG PO TABS: 150.0000 mg | ORAL_TABLET | Freq: Two times a day (BID) | ORAL | 11 refills | Status: DC

## 2016-07-16 MED ORDER — LISINOPRIL 20 MG PO TABS: 20.0000 mg | ORAL_TABLET | Freq: Every day | ORAL | 0 refills | Status: DC

## 2016-07-16 NOTE — Telephone Encounter (Signed)
Thank you!.  Rxs sent.

## 2016-07-16 NOTE — Telephone Encounter (Signed)
Patient states she will change to the ranitidine and would like all rx's sent to the  [facility].  She will schedule appt at work to get labs done.

## 2016-07-16 NOTE — Telephone Encounter (Signed)
-----   Message from Cathrine Muster, [place] sent at 07/16/2016  8:03 AM EST -----   ----- Message ----- From: Arnetha Courser, MD Sent: 07/15/2016  12:42 PM To: Cathrine Muster, CMA  I'm pleased to report that her pap smear was completely negative / normal

## 2016-07-23 ENCOUNTER — Other Ambulatory Visit: Payer: BLUE CROSS/BLUE SHIELD

## 2016-07-23 ENCOUNTER — Other Ambulatory Visit: Payer: Self-pay | Admitting: Family Medicine

## 2016-07-23 DIAGNOSIS — I10 Essential (primary) hypertension: Secondary | ICD-10-CM

## 2016-07-23 DIAGNOSIS — Z0000 Encounter for general adult medical examination without abnormal findings: Secondary | ICD-10-CM

## 2016-07-24 LAB — CMP12+LP+TP+TSH+6AC+CBC/D/PLT
ALT: 21 IU/L (ref 0–32)
AST: 17 IU/L (ref 0–40)
Albumin/Globulin Ratio: 1.7 (ref 1.2–2.2)
Albumin: 4.1 g/dL (ref 3.5–5.5)
Alkaline Phosphatase: 83 IU/L (ref 39–117)
BASOS: 0 %
BUN/Creatinine Ratio: 24 — ABNORMAL HIGH (ref 9–23)
BUN: 15 mg/dL (ref 6–24)
Basophils Absolute: 0 10*3/uL (ref 0.0–0.2)
Bilirubin Total: 0.6 mg/dL (ref 0.0–1.2)
CALCIUM: 9.2 mg/dL (ref 8.7–10.2)
CHLORIDE: 100 mmol/L (ref 96–106)
CHOL/HDL RATIO: 3.3 ratio (ref 0.0–4.4)
Cholesterol, Total: 182 mg/dL (ref 100–199)
Creatinine, Ser: 0.63 mg/dL (ref 0.57–1.00)
EOS (ABSOLUTE): 0.3 10*3/uL (ref 0.0–0.4)
Eos: 3 %
Estimated CHD Risk: <0.5 times avg. (ref 0.0–1.0)
Free Thyroxine Index: 2 (ref 1.2–4.9)
GFR calc Af Amer: 118 mL/min/{1.73_m2} (ref >59)
GFR calc non Af Amer: 102 mL/min/{1.73_m2} (ref >59)
GGT: 20 IU/L (ref 0–60)
GLOBULIN, TOTAL: 2.4 g/dL (ref 1.5–4.5)
GLUCOSE: 91 mg/dL (ref 65–99)
HDL: 56 mg/dL (ref >39)
HEMATOCRIT: 44.8 % (ref 34.0–46.6)
Hemoglobin: 14.6 g/dL (ref 11.1–15.9)
IMMATURE GRANS (ABS): 0 10*3/uL (ref 0.0–0.1)
Immature Granulocytes: 0 %
Iron: 61 ug/dL (ref 27–159)
LDH: 171 IU/L (ref 119–226)
LDL CALC: 108 mg/dL — AB (ref 0–99)
LYMPHS: 24 %
Lymphocytes Absolute: 2 10*3/uL (ref 0.7–3.1)
MCH: 28.5 pg (ref 26.6–33.0)
MCHC: 32.6 g/dL (ref 31.5–35.7)
MCV: 87 fL (ref 79–97)
MONOCYTES: 6 %
MONOS ABS: 0.5 10*3/uL (ref 0.1–0.9)
NEUTROS ABS: 5.7 10*3/uL (ref 1.4–7.0)
Neutrophils: 67 %
POTASSIUM: 4.1 mmol/L (ref 3.5–5.2)
Phosphorus: 3.6 mg/dL (ref 2.5–4.5)
Platelets: 236 10*3/uL (ref 150–379)
RBC: 5.13 x10E6/uL (ref 3.77–5.28)
RDW: 13.9 % (ref 12.3–15.4)
SODIUM: 143 mmol/L (ref 134–144)
T3 Uptake Ratio: 28 % (ref 24–39)
T4, Total: 7.1 ug/dL (ref 4.5–12.0)
TRIGLYCERIDES: 92 mg/dL (ref 0–149)
TSH: 3.81 u[IU]/mL (ref 0.450–4.500)
Total Protein: 6.5 g/dL (ref 6.0–8.5)
URIC ACID: 6.2 mg/dL (ref 2.5–7.1)
VLDL Cholesterol Cal: 18 mg/dL (ref 5–40)
WBC: 8.5 10*3/uL (ref 3.4–10.8)

## 2016-07-29 ENCOUNTER — Encounter: Payer: Self-pay | Admitting: Medical

## 2016-07-29 ENCOUNTER — Ambulatory Visit: Payer: BLUE CROSS/BLUE SHIELD

## 2016-07-29 ENCOUNTER — Ambulatory Visit: Payer: BLUE CROSS/BLUE SHIELD | Admitting: Medical

## 2016-07-29 VITALS — BP 138/76 | HR 71 | Temp 98.0°F | Resp 16 | Ht 62.0 in | Wt 220.0 lb

## 2016-07-30 ENCOUNTER — Ambulatory Visit: Payer: BLUE CROSS/BLUE SHIELD | Admitting: Medical

## 2016-07-30 ENCOUNTER — Encounter: Payer: Self-pay | Admitting: Medical

## 2016-07-30 VITALS — BP 136/74 | HR 84 | Temp 98.0°F | Resp 16 | Ht 62.0 in | Wt 236.0 lb

## 2016-07-30 DIAGNOSIS — E559 Vitamin D deficiency, unspecified: Secondary | ICD-10-CM | POA: Diagnosis not present

## 2016-07-30 DIAGNOSIS — T50905A Adverse effect of unspecified drugs, medicaments and biological substances, initial encounter: Secondary | ICD-10-CM

## 2016-07-30 NOTE — Progress Notes (Signed)
Subjective:     Patient ID: Bridget Reed, female   DOB: 1960/06/09, 56 y.o.   MRN: [id number]  HPI 56 yo female,started on Mobic due to inflammation in achilles tendons bilaterally, by Dr. Jacqualyn Posey. Patient thinks she inflammed them by walking in flat shoes on Friday night. She usually wears a low heels. She started her Mobic on Saturday and noticed swelling under eyes, hands and ankles. She stopped the Medication as of Monday. She states the swelling is much improved. Had been on Mobic previously but did not notice any swelling in her body before.   Review of Systems  Constitutional: Positive for appetite change.  Respiratory: Negative for shortness of breath.   Cardiovascular: Positive for leg swelling.    No swelling around lips, no problems breathing. Swelling inferior of eyes but not around eyes.  No rash.     Objective:   Physical Exam Mild less than  1+ swelling pitting edema noted on right ankle. No swelling noted on left ankle.    Assessment:     Reaction to Mobic ( meloxicam).    Plan:     Will try pt on Motrin 400mg  po TID with food, May increase to 800mg  if having no reaction. Previous use of Motrin without side effects. To stop if similar symptoms occur. And Call me. Recent labs by her new doctor did not include vitamin D.  Will check patient vitamin D level , last checked in 2016.

## 2016-07-30 NOTE — Progress Notes (Signed)
Patient takes mobic for bilateral inflamed achilles tendons.  After 3 days of taking medicaiton, noticed edema.  Patient states it is a side effect of Mobic. Would like suggestion of another pain medication.  She's been off med for 3 days.

## 2016-07-30 NOTE — Progress Notes (Signed)
Patient seen by RN not seen by PA , patient had to leave due to time constraints.

## 2016-07-31 ENCOUNTER — Encounter: Payer: Self-pay | Admitting: Medical

## 2016-07-31 LAB — VITAMIN D 25 HYDROXY (VIT D DEFICIENCY, FRACTURES): Vit D, 25-Hydroxy: 33.1 ng/mL (ref 30.0–100.0)

## 2016-08-07 ENCOUNTER — Ambulatory Visit (INDEPENDENT_AMBULATORY_CARE_PROVIDER_SITE_OTHER): Payer: Self-pay | Admitting: Podiatry

## 2016-08-07 DIAGNOSIS — B351 Tinea unguium: Secondary | ICD-10-CM

## 2016-08-07 DIAGNOSIS — M79674 Pain in right toe(s): Secondary | ICD-10-CM

## 2016-08-07 DIAGNOSIS — M79675 Pain in left toe(s): Secondary | ICD-10-CM

## 2016-08-13 NOTE — Progress Notes (Signed)
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 All safety precautions were in place. Re-appointed in 1 month for 4th of 4-6 treatments

## 2016-08-17 ENCOUNTER — Other Ambulatory Visit: Payer: Self-pay

## 2016-08-17 MED ORDER — LISINOPRIL 20 MG PO TABS: 20.0000 mg | ORAL_TABLET | Freq: Every day | ORAL | 2 refills | Status: DC

## 2016-08-17 MED ORDER — HYDROCHLOROTHIAZIDE 12.5 MG PO TABS: 12.5000 mg | ORAL_TABLET | Freq: Every day | ORAL | 2 refills | Status: DC

## 2016-08-17 NOTE — Telephone Encounter (Signed)
Reviewed last labs 9 month supply of meds approved

## 2016-08-17 NOTE — Telephone Encounter (Signed)
Requesting 90 day

## 2016-09-04 ENCOUNTER — Ambulatory Visit: Payer: Self-pay | Admitting: Podiatry

## 2016-09-04 DIAGNOSIS — R52 Pain, unspecified: Secondary | ICD-10-CM

## 2016-09-04 DIAGNOSIS — B351 Tinea unguium: Secondary | ICD-10-CM

## 2016-09-07 NOTE — Progress Notes (Signed)
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 All safety precautions were in place. Re-appointed in 1 month for 5th of 6 treatments

## 2016-09-09 ENCOUNTER — Ambulatory Visit
Admission: RE | Admit: 2016-09-09 | Discharge: 2016-09-09 | Disposition: A | Discharge status: Home / Self Care | Payer: BLUE CROSS/BLUE SHIELD | Source: Ambulatory Visit | Attending: Family Medicine | Admitting: Family Medicine

## 2016-09-09 DIAGNOSIS — Z1231 Encounter for screening mammogram for malignant neoplasm of breast: Secondary | ICD-10-CM | POA: Diagnosis not present

## 2016-10-02 ENCOUNTER — Ambulatory Visit: Payer: Self-pay

## 2016-10-02 DIAGNOSIS — B351 Tinea unguium: Secondary | ICD-10-CM

## 2016-10-06 NOTE — Progress Notes (Signed)
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 All safety precautions were in place. Re-appointed in 1 month for 6 of 6 treatments.

## 2016-10-30 ENCOUNTER — Ambulatory Visit: Payer: BLUE CROSS/BLUE SHIELD

## 2016-10-30 DIAGNOSIS — M79674 Pain in right toe(s): Secondary | ICD-10-CM

## 2016-10-30 DIAGNOSIS — B351 Tinea unguium: Secondary | ICD-10-CM

## 2016-10-30 DIAGNOSIS — M79675 Pain in left toe(s): Secondary | ICD-10-CM

## 2016-11-10 NOTE — Progress Notes (Signed)
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 All safety precautions were in place. Re-appointed prn

## 2016-12-07 DIAGNOSIS — M4005 Postural kyphosis, thoracolumbar region: Secondary | ICD-10-CM | POA: Diagnosis not present

## 2016-12-07 DIAGNOSIS — M9908 Segmental and somatic dysfunction of rib cage: Secondary | ICD-10-CM | POA: Diagnosis not present

## 2016-12-07 DIAGNOSIS — M9902 Segmental and somatic dysfunction of thoracic region: Secondary | ICD-10-CM | POA: Diagnosis not present

## 2016-12-24 ENCOUNTER — Ambulatory Visit (INDEPENDENT_AMBULATORY_CARE_PROVIDER_SITE_OTHER): Payer: BLUE CROSS/BLUE SHIELD | Admitting: Podiatry

## 2016-12-24 ENCOUNTER — Encounter: Payer: Self-pay | Admitting: Podiatry

## 2016-12-24 DIAGNOSIS — M7730 Calcaneal spur, unspecified foot: Secondary | ICD-10-CM

## 2016-12-24 DIAGNOSIS — B351 Tinea unguium: Secondary | ICD-10-CM

## 2016-12-24 DIAGNOSIS — M7661 Achilles tendinitis, right leg: Secondary | ICD-10-CM

## 2016-12-24 DIAGNOSIS — M7662 Achilles tendinitis, left leg: Secondary | ICD-10-CM

## 2016-12-25 ENCOUNTER — Other Ambulatory Visit: Payer: Self-pay

## 2016-12-25 DIAGNOSIS — B351 Tinea unguium: Secondary | ICD-10-CM | POA: Insufficient documentation

## 2016-12-25 DIAGNOSIS — M7730 Calcaneal spur, unspecified foot: Secondary | ICD-10-CM | POA: Insufficient documentation

## 2016-12-25 MED ORDER — UREA 40 % EX CREA: 1.0000 g | TOPICAL_CREAM | Freq: Two times a day (BID) | CUTANEOUS | 3 refills | Status: DC

## 2016-12-25 MED ORDER — DICLOFENAC SODIUM 1 % TD GEL: 2.0000 g | Freq: Four times a day (QID) | TRANSDERMAL | 2 refills | Status: DC

## 2016-12-25 NOTE — Progress Notes (Signed)
Subjective: Farhana presents to the office today for follow up evaluation of 2 concerns. She states that she's had 6 treatments of laser to her toenails and she has not noticed much improvement to the majority of her nails. She has noticed some improvement to some of the lesser digit nails but the right hallux as well as the third digit toenail continued thick and discolored. Denies any pain to the nails and she is more concerned about the appearance. Denies any swelling or redness or any drainage. She has been continuing with Jublia as well.   Also she states that she continues to get pain to the back of her right heel worsening left side. She said that she takes ibuprofen daily. Meloxicam helped however this caused swelling. She states that it takes her 5 minutes in the morning to get stretched before she starts to walk. She states that as long as she stretches that she takes anti-inflammatories daily she does well. She also has a night splint.  Denies any systemic complaints such as fevers, chills, nausea, vomiting. No acute changes since last appointment, and no other complaints at this time.   Objective: AAO x3, NAD DP/PT pulses palpable bilaterally, CRT less than 3 seconds Nails continue dystrophic, discolored yellow-brown discoloration as well as dystrophic. Mostly the right hallux was third digit toenail of the worse.The hallux appeared to be more damage to the nail as well. Is no swelling, redness or drainage or any signs of infection present. There is prominent retrocalcaneal exostosis palpable posterior heels bilaterally the right side worse than left. Mild tenderness palpation of the right posterior heel. No pain on the Achilles tendon Thompson test is negative. There is no pain with lateral compression of the calcaneus. There is no overlying edema, erythema, increase in warmth today. No open lesions or pre-ulcerative lesions.  No pain with calf compression, swelling, warmth,  erythema  Assessment: Onychomycosis/onychodystrophy; heel spur/insertional Achilles tendinitis  Plan: -All treatment options discussed with the patient including all alternatives, risks, complications.  -Regards the nail fungus. We discussed options. She was still off on any oral therapy. We will switch to using urea cream to help within the nail as well as using a compound ointment for toenail fungus. These were both ordered [facility].  -In regards the heel pain at this point I recommended physical therapy prescription was provided to her today for [facility]. I also ordered voltaren gel to help decrease the amount of oral anti-inflammatory she is taking. She states that she stretches at home which she does not do it as much as she should. Hopefully going to physical therapy will help as well. Also discussion verification offloading. -Follow up in 2 months or sooner if needed. Call any questions or concerns. -Patient encouraged to call the office with any questions, concerns, change in symptoms.   Celesta Gentile, DPM

## 2017-01-28 ENCOUNTER — Ambulatory Visit: Payer: BLUE CROSS/BLUE SHIELD | Admitting: Podiatry

## 2017-02-10 DIAGNOSIS — M9901 Segmental and somatic dysfunction of cervical region: Secondary | ICD-10-CM | POA: Diagnosis not present

## 2017-02-10 DIAGNOSIS — M531 Cervicobrachial syndrome: Secondary | ICD-10-CM | POA: Diagnosis not present

## 2017-02-10 DIAGNOSIS — G44219 Episodic tension-type headache, not intractable: Secondary | ICD-10-CM | POA: Diagnosis not present

## 2017-02-10 DIAGNOSIS — M9902 Segmental and somatic dysfunction of thoracic region: Secondary | ICD-10-CM | POA: Diagnosis not present

## 2017-02-11 ENCOUNTER — Ambulatory Visit: Payer: BLUE CROSS/BLUE SHIELD | Admitting: Adult Health

## 2017-02-11 ENCOUNTER — Encounter: Payer: Self-pay | Admitting: Adult Health

## 2017-02-11 VITALS — BP 122/72 | HR 85 | Temp 98.1°F | Resp 16 | Ht 63.0 in | Wt 234.0 lb

## 2017-02-11 DIAGNOSIS — T148XXA Other injury of unspecified body region, initial encounter: Secondary | ICD-10-CM

## 2017-02-11 MED ORDER — CYCLOBENZAPRINE HCL 5 MG PO TABS: 5.0000 mg | ORAL_TABLET | Freq: Three times a day (TID) | ORAL | 0 refills | Status: DC | PRN

## 2017-02-11 NOTE — Progress Notes (Signed)
Subjective:     Patient ID: Bridget Reed, female   DOB: 08/09/1960, 56 y.o.   MRN: [id number]  HPI   Patient is a 56 year old who presents to the clinic in no acute distress. She comes in today and reports he has been tense and stressed at work and has slept in a strange position the past few nights. . She reports she went to the chiropractor twice yesterday with relief the first time, and not much relief the second time.  She feels like her muscles are tight and requests a muscle relaxer. She denies any chest pain or shortness of breath. She denies any radiating neck or jaw pain or any chest tightness.  No recent illness.  Denies any cracking,popping or any injury at all. Denies any falls. No car wrecks.  She reports she has been like this in the past and usually Motrin or Aleve loosens this up but not this time.   She has taken Aleve twice daily for the past couple of days with some improvement.   She denies any neck or back surgeries or previous injuries.  She denies any recent infections, nausea, vomiting or diarrhea. She denies any fevers or ill contacts/exposures.   Blood pressure 122/72, pulse 85, temperature 98.1 F (36.7 C), temperature source Tympanic, resp. rate 16, height 5\' 3"  (1.6 m), weight 234 lb (106.1 kg), last menstrual period 11/22/2013, SpO2 97 %. Patient Active Problem List   Diagnosis Date Noted  . Heel spur, unspecified laterality 12/25/2016  . Onychomycosis 12/25/2016  . Screening for breast cancer 07/13/2016  . Morbid obesity ([place]) 07/13/2016  . Screening for cervical cancer 07/13/2016  . Preventative health care 12/04/2015  . Benign neoplasm of ascending colon   . Personal history of colonic polyps   . Annual physical exam 07/16/2015  . Intermittent chest pain 07/16/2015  . Encounter for screening mammogram for malignant neoplasm of breast 07/16/2015  . Fatigue 02/09/2012  . Snoring 02/09/2012  . HTN (hypertension), benign 09/19/2010  . GERD  (gastroesophageal reflux disease) 09/19/2010  . History of iron deficiency anemia 09/19/2010  . Allergic rhinitis 09/19/2010    Current Outpatient Prescriptions:  .  hydrochlorothiazide (HYDRODIURIL) 12.5 MG tablet, Take 1 tablet (12.5 mg total) by mouth daily., Disp: 90 tablet, Rfl: 2 .  lisinopril (PRINIVIL,ZESTRIL) 20 MG tablet, Take 1 tablet (20 mg total) by mouth daily., Disp: 90 tablet, Rfl: 2 .  diclofenac sodium (VOLTAREN) 1 % GEL, Apply 2 g topically 4 (four) times daily. Rub into affected area of foot 2 to 4 times daily, Disp: 100 g, Rfl: 2 .  etodolac (LODINE) 500 MG tablet, etodolac 500 mg tablet, Disp: , Rfl:  .  NON FORMULARY, [facility]  Onychomycosis Nail Lacquer -  Fluconazole 2%, Terbinafine 1% DMSO Apply to affected nail once daily Qty. 120 gm 3 refills, Disp: , Rfl:  .  Probiotic Product (PROBIOTIC DAILY PO), Take by mouth., Disp: , Rfl:  .  triamcinolone (NASACORT) 55 MCG/ACT AERO nasal inhaler, Place 1 spray into the nose as needed. , Disp: , Rfl:   Allergies  Allergen Reactions  . Mobic [Meloxicam] Swelling    In hands and ankles and under eyes.  . Erythromycin     Rash, GI upset  . Erythromycin Base   . Influenza Vac Split Quad     Possible allergy- rash after injection  . Sulfa Antibiotics   . Azithromycin Rash       Review of Systems  Constitutional: Negative.  HENT: Negative.   Eyes: Negative.   Cardiovascular: Negative.   Gastrointestinal: Negative.   Endocrine: Negative.   Genitourinary: Negative.   Musculoskeletal: Positive for myalgias (right scapular soreness when she strethches her neck to the left side. ) and neck stiffness (with neck  movement to her left side ).  Skin: Negative.   Allergic/Immunologic: Negative.   Neurological: Negative.   Hematological: Negative.   Psychiatric/Behavioral: Negative.        Objective:   Physical Exam  Constitutional: She is oriented to person, place, and time. Vital signs are normal. She  appears well-developed and well-nourished. She is active. She is not intubated.  HENT:  Head: Normocephalic and atraumatic.  Right Ear: External ear normal.  Left Ear: External ear normal.  Nose: Nose normal.  Mouth/Throat: Oropharynx is clear and moist. No oropharyngeal exudate.  Eyes: Pupils are equal, round, and reactive to light. Conjunctivae and EOM are normal. Right eye exhibits no discharge. Left eye exhibits no discharge. No scleral icterus.  Neck: Trachea normal and full passive range of motion without pain. Neck supple. Normal carotid pulses, no hepatojugular reflux and no JVD present. No spinous process tenderness and no muscular tenderness present. Carotid bruit is not present. No neck rigidity. No tracheal deviation, no edema, no erythema and normal range of motion present. No Brudzinski's sign and no Kernig's sign noted. No thyroid mass and no thyromegaly present.    On drawing area documented patient has neck soreness. She denies any soreness with palpation.  She does have mild decreased range of motion with left lateral flexion- she is able to have full range of motion if she stretches more- she was able to do this in the office.   Cardiovascular: Normal rate, regular rhythm, S1 normal, S2 normal and normal pulses.  Exam reveals no distant heart sounds and no friction rub.   Pulmonary/Chest: Effort normal and breath sounds normal. No stridor. No apnea, no tachypnea and no bradypnea. She is not intubated.      Right mild scapular pain with lateral flexion of neck only. She reports she feels as if her muscle is tense. She rates this pain 1/10 on pain scale.   Abdominal: Soft. Normal appearance, normal aorta and bowel sounds are normal. There is no hepatosplenomegaly, splenomegaly or hepatomegaly. There is no tenderness. There is no CVA tenderness.  Musculoskeletal: Normal range of motion.  Patient moves on and off of exam table and in room without difficulty. Gait is normal in  hall and in room. Patient is oriented to person place time and situation. Patient answers questions appropriately and engages in conversation.   Lymphadenopathy:       Head (right side): No submental, no submandibular, no tonsillar, no preauricular, no posterior auricular and no occipital adenopathy present.       Head (left side): No submental, no submandibular, no tonsillar, no preauricular, no posterior auricular and no occipital adenopathy present.    She has no cervical adenopathy.    She has no axillary adenopathy.  Neurological: She is alert and oriented to person, place, and time. She has normal strength and normal reflexes. She displays no atrophy and no tremor. No cranial nerve deficit or sensory deficit. She exhibits normal muscle tone. She displays a negative Romberg sign. She displays no seizure activity. Coordination and gait normal. GCS eye subscore is 4. GCS verbal subscore is 5. GCS motor subscore is 6.  Skin: Skin is warm, dry and intact. She is not diaphoretic. No cyanosis. No  pallor. Nails show no clubbing.  Psychiatric: She has a normal mood and affect. Her speech is normal and behavior is normal. Judgment and thought content normal. Cognition and memory are normal.  Vitals reviewed.      Assessment:     Muscle strain      Plan:     Meds ordered this encounter  Medications  . cyclobenzaprine (FLEXERIL) 5 MG tablet    Sig: Take 1 tablet (5 mg total) by mouth 3 (three) times daily as needed for muscle spasms.    Dispense:  20 tablet    Refill:  0     E- Prescribed as above. No refills. Patient was offered a cervical x-ray but refuses at this tie- she reports she has had previous normal x-ray. She is advised to return to clinic if any symptoms worsen at anytime or fail to improve.She agrees to return for evaluation and x-ray of no improvement within 3 to 5 days. Patient will call office by 02/17/17 and before if no improvement.  Advised not to drive or operate machinery  or use any equipment or make legal decisions while on medication. She denies the need for a work note.   Hand out on medication prescribed and muscle strain given.  Return to clinic at any time  if any new symptoms change, worsen or do not improve. Symptoms should improve  within 72 hours and if not improving you should call for an appointment at the clinic or bee seen in urgent care/ED if clinic is closed.  Patient verbalized above understanding of all instructions.  Patient verbalized understanding of instructions and denies any further questions at this time.

## 2017-02-11 NOTE — Patient Instructions (Addendum)
Muscle Pain, Adult Muscle pain (myalgia) may be mild or severe. In most cases, the pain lasts only a short time and it goes away without treatment. It is normal to feel some muscle pain after starting a workout program. Muscles that have not been used often will be sore at first. Muscle pain may also be caused by many other things, including:  Overuse or muscle strain, especially if you are not in shape. This is the most common cause of muscle pain.  Injury.  Bruises.  Viruses, such as the flu.  Infectious diseases.  A chronic condition that causes muscle tenderness, fatigue, and headache (fibromyalgia).  A condition, such as lupus, in which the body's disease-fighting system attacks other organs in the body (autoimmune or rheumatologic diseases).  Certain drugs, including ACE inhibitors and statins.  To diagnose the cause of your muscle pain, your health care provider will do a physical exam and ask questions about the pain and when it began. If you have not had muscle pain for very long, your health care provider may want to wait before doing much testing. If your muscle pain has lasted a long time, your health care provider may want to run tests right away. In some cases, this may include tests to rule out certain conditions or illnesses. Treatment for muscle pain depends on the cause. Home care is often enough to relieve muscle pain. Your health care provider may also prescribe anti-inflammatory medicine. Follow these instructions at home: Activity  If overuse is causing your muscle pain: ? Slow down your activities until the pain goes away. ? Do regular, gentle exercises if you are not usually active. ? Warm up before exercising. Stretch before and after exercising. This can help lower the risk of muscle pain.  Do not continue working out if the pain is very bad. Bad pain could mean that you have injured a muscle. Managing pain and discomfort   If directed, apply ice to the  sore muscle: ? Put ice in a plastic bag. ? Place a towel between your skin and the bag. ? Leave the ice on for 20 minutes, 2-3 times a day.  You may also alternate between applying ice and applying heat as told by your health care provider. To apply heat, use the heat source that your health care provider recommends, such as a moist heat pack or a heating pad. ? Place a towel between your skin and the heat source. ? Leave the heat on for 20-30 minutes. ? Remove the heat if your skin turns bright red. This is especially important if you are unable to feel pain, heat, or cold. You may have a greater risk of getting burned. Medicines  Take over-the-counter and prescription medicines only as told by your health care provider.  Do not drive or use heavy machinery while taking prescription pain medicine. Contact a health care provider if:  Your muscle pain gets worse and medicines do not help.  You have muscle pain that lasts longer than 3 days.  You have a rash or fever along with muscle pain.  You have muscle pain after a tick bite.  You have muscle pain while working out, even though you are in good physical condition.  You have redness, soreness, or swelling along with muscle pain.  You have muscle pain after starting a new medicine or changing the dose of a medicine. Get help right away if:  You have trouble breathing.  You have trouble swallowing.  You have   muscle pain along with a stiff neck, fever, and vomiting.  You have severe muscle weakness or cannot move part of your body. This information is not intended to replace advice given to you by your health care provider. Make sure you discuss any questions you have with your health care provider. Document Released: 04/02/2006 Document Revised: 11/29/2015 Document Reviewed: 10/01/2015 Elsevier Interactive Patient Education  2018 [employer]. Cyclobenzaprine tablets What is this medicine? CYCLOBENZAPRINE (sye kloe BEN za  preen) is a muscle relaxer. It is used to treat muscle pain, spasms, and stiffness. This medicine may be used for other purposes; ask your health care provider or pharmacist if you have questions. COMMON BRAND NAME(S): Fexmid, Flexeril What should I tell my health care provider before I take this medicine? They need to know if you have any of these conditions: -heart disease, irregular heartbeat, or previous heart attack -liver disease -thyroid problem -an unusual or allergic reaction to cyclobenzaprine, tricyclic antidepressants, lactose, other medicines, foods, dyes, or preservatives -pregnant or trying to get pregnant -breast-feeding How should I use this medicine? Take this medicine by mouth with a glass of water. Follow the directions on the prescription label. If this medicine upsets your stomach, take it with food or milk. Take your medicine at regular intervals. Do not take it more often than directed. Talk to your pediatrician regarding the use of this medicine in children. Special care may be needed. Overdosage: If you think you have taken too much of this medicine contact a poison control center or emergency room at once. NOTE: This medicine is only for you. Do not share this medicine with others. What if I miss a dose? If you miss a dose, take it as soon as you can. If it is almost time for your next dose, take only that dose. Do not take double or extra doses. What may interact with this medicine? Do not take this medicine with any of the following medications: -certain medicines for fungal infections like fluconazole, itraconazole, ketoconazole, posaconazole, voriconazole -cisapride -dofetilide -dronedarone -halofantrine -levomethadyl -MAOIs like Carbex, Eldepryl, Marplan, Nardil, and Parnate -narcotic medicines for cough -pimozide -thioridazine -ziprasidone This medicine may also interact with the following medications: -alcohol -antihistamines for allergy, cough and  cold -certain medicines for anxiety or sleep -certain medicines for cancer -certain medicines for depression like amitriptyline, fluoxetine, sertraline -certain medicines for infection like alfuzosin, chloroquine, clarithromycin, levofloxacin, mefloquine, pentamidine, troleandomycin -certain medicines for irregular heart beat -certain medicines for seizures like phenobarbital, primidone -contrast dyes -general anesthetics like halothane, isoflurane, methoxyflurane, propofol -local anesthetics like lidocaine, pramoxine, tetracaine -medicines that relax muscles for surgery -narcotic medicines for pain -other medicines that prolong the QT interval (cause an abnormal heart rhythm) -phenothiazines like chlorpromazine, mesoridazine, prochlorperazine This list may not describe all possible interactions. Give your health care provider a list of all the medicines, herbs, non-prescription drugs, or dietary supplements you use. Also tell them if you smoke, drink alcohol, or use illegal drugs. Some items may interact with your medicine. What should I watch for while using this medicine? Tell your doctor or health care professional if your symptoms do not start to get better or if they get worse. You may get drowsy or dizzy. Do not drive, use machinery, or do anything that needs mental alertness until you know how this medicine affects you. Do not stand or sit up quickly, especially if you are an older patient. This reduces the risk of dizzy or fainting spells. Alcohol may interfere with the effect  of this medicine. Avoid alcoholic drinks. If you are taking another medicine that also causes drowsiness, you may have more side effects. Give your health care provider a list of all medicines you use. Your doctor will tell you how much medicine to take. Do not take more medicine than directed. Call emergency for help if you have problems breathing or unusual sleepiness. Your mouth may get dry. Chewing sugarless gum  or sucking hard candy, and drinking plenty of water may help. Contact your doctor if the problem does not go away or is severe. What side effects may I notice from receiving this medicine? Side effects that you should report to your doctor or health care professional as soon as possible: -allergic reactions like skin rash, itching or hives, swelling of the face, lips, or tongue -breathing problems -chest pain -fast, irregular heartbeat -hallucinations -seizures -unusually weak or tired Side effects that usually do not require medical attention (report to your doctor or health care professional if they continue or are bothersome): -headache -nausea, vomiting This list may not describe all possible side effects. Call your doctor for medical advice about side effects. You may report side effects to FDA at 1-800-FDA-1088. Where should I keep my medicine? Keep out of the reach of children. Store at room temperature between 15 and 30 degrees C (59 and 86 degrees F). Keep container tightly closed. Throw away any unused medicine after the expiration date. NOTE: This sheet is a summary. It may not cover all possible information. If you have questions about this medicine, talk to your doctor, pharmacist, or health care provider.  2018 Elsevier/Gold Standard (2015-02-19 12:05:46)

## 2017-02-25 ENCOUNTER — Ambulatory Visit (INDEPENDENT_AMBULATORY_CARE_PROVIDER_SITE_OTHER): Payer: BLUE CROSS/BLUE SHIELD | Admitting: Podiatry

## 2017-02-25 ENCOUNTER — Encounter: Payer: Self-pay | Admitting: Podiatry

## 2017-02-25 DIAGNOSIS — M7662 Achilles tendinitis, left leg: Secondary | ICD-10-CM

## 2017-02-25 DIAGNOSIS — B351 Tinea unguium: Secondary | ICD-10-CM | POA: Diagnosis not present

## 2017-02-25 DIAGNOSIS — M7661 Achilles tendinitis, right leg: Secondary | ICD-10-CM

## 2017-03-03 NOTE — Progress Notes (Signed)
Subjective: Bridget Reed presents the office they for follow-up evaluation of onychomycosis as well as for Achilles tendinitis. She states that she has been using the urea cream on her toenails which is been helping some. She has no pain in the nails and she denies any redness or drainage or any swelling. She states that she did not go to physical therapy for heel pain because the pain was much improved. She has been stretching and icing as well as change in her shoes which is been helping. She is no pain to the back of her heel this time. She is known to concerns that arise. Denies any systemic complaints such as fevers, chills, nausea, vomiting. No acute changes since last appointment, and no other complaints at this time.   Objective: AAO x3, NAD DP/PT pulses palpable bilaterally, CRT less than 3 seconds Nails any cement dystrophic, discolored with yellow discoloration mild hypertrophic. There is no pain in the nails and is no swelling or redness or drainage. Overall the nails do appear to have improvement compared to when we first started. In regards to the heel pain there is probably still spur still present however there is no pain of the ear there is no swelling redness or drainage or any swelling. Is no pain on the course of the Achilles tendon and the Achilles tendon appears to be intact. There is no pain along the course of plantar fascia. There is no pain with lateral compression of the calcaneus there is no other areas of tenderness involving this time. No open lesions or pre-ulcerative lesions.  No pain with calf compression, swelling, warmth, erythema  Assessment: Improving onychomycosis, onychodystrophy ; improved heel pain   Plan: -All treatment options discussed with the patient including all alternatives, risks, complications.  -At this point in regards heel pain she is doing well. She did not get physical therapy digit pain was much improved. I will continue stretching, icing as well at  home and continuous change in shoes as well as offloading to the area needed. -Continue urea cream to the nails. Apply antifungal -At this point follow up as needed. She agrees to this plan as she is doing well. -Patient encouraged to call the office with any questions, concerns, change in symptoms.    Celesta Gentile, DPM

## 2017-05-13 ENCOUNTER — Other Ambulatory Visit: Payer: Self-pay | Admitting: Family Medicine

## 2017-05-13 NOTE — Telephone Encounter (Signed)
Reviewed last K+ and Cr; Rx approved 

## 2017-06-07 ENCOUNTER — Other Ambulatory Visit: Payer: Self-pay | Admitting: Family Medicine

## 2017-06-07 NOTE — Telephone Encounter (Signed)
Refill request received I see patient has canceled appt with me for February and is establishing with a new provider on January 23rd I will provide refill of her HCTZ to last until her appointment and wish her well

## 2017-06-16 ENCOUNTER — Encounter: Payer: Self-pay | Admitting: Internal Medicine

## 2017-06-16 ENCOUNTER — Ambulatory Visit: Payer: BLUE CROSS/BLUE SHIELD | Admitting: Internal Medicine

## 2017-06-16 VITALS — BP 134/64 | HR 81 | Temp 98.7°F | Resp 16 | Ht 62.0 in | Wt 241.2 lb

## 2017-06-16 DIAGNOSIS — J309 Allergic rhinitis, unspecified: Secondary | ICD-10-CM | POA: Diagnosis not present

## 2017-06-16 DIAGNOSIS — Z0001 Encounter for general adult medical examination with abnormal findings: Secondary | ICD-10-CM | POA: Diagnosis not present

## 2017-06-16 DIAGNOSIS — I10 Essential (primary) hypertension: Secondary | ICD-10-CM

## 2017-06-16 MED ORDER — LISINOPRIL-HYDROCHLOROTHIAZIDE 20-12.5 MG PO TABS: 1.0000 | ORAL_TABLET | Freq: Every day | ORAL | 3 refills | Status: DC

## 2017-06-16 MED ORDER — MONTELUKAST SODIUM 10 MG PO TABS: 10.0000 mg | ORAL_TABLET | Freq: Every day | ORAL | 3 refills | Status: DC

## 2017-06-16 NOTE — Patient Instructions (Signed)
Please follow up yearly sooner if needed  Will refer to [facility] for mammogram  Take care  Think about shingrix vaccine x 2 doses  Allergies, Adult An allergy is when your body's defense system (immune system) overreacts to an otherwise harmless substance (allergen) that you breathe in or eat or something that touches your skin. When you come into contact with something that you are allergic to, your immune system produces certain proteins (antibodies). These proteins cause cells to release chemicals (histamines) that trigger the symptoms of an allergic reaction. Allergies often affect the nasal passages (allergic rhinitis), eyes (allergic conjunctivitis), skin (atopic dermatitis), and stomach. Allergies can be mild or severe. Allergies cannot spread from person to person (are not contagious). They can develop at any age and may be outgrown. What increases the risk? You may be at greater risk of allergies if other people in your family have allergies. What are the signs or symptoms? Symptoms depend on what type of allergy you have. They may include:  Runny, stuffy nose.  Sneezing.  Itchy mouth, ears, or throat.  Postnasal drip.  Sore throat.  Itchy, red, watery, or puffy eyes.  Skin rash or hives.  Stomach pain.  Vomiting.  Diarrhea.  Bloating.  Wheezing or coughing.  People with a severe allergy to food, medicine, or an insect bite may have a life-threatening allergic reaction (anaphylaxis). Symptoms of anaphylaxis include:  Hives.  Itching.  Flushed face.  Swollen lips, tongue, or mouth.  Tight or swollen throat.  Chest pain or tightness in the chest.  Trouble breathing or shortness of breath.  Rapid heartbeat.  Dizziness or fainting.  Vomiting.  Diarrhea.  Pain in the abdomen.  How is this diagnosed? This condition is diagnosed based on:  Your symptoms.  Your family and medical history.  A physical exam.  You may need to see a health care  provider who specializes in treating allergies (allergist). You may also have tests, including:  Skin tests to see which allergens are causing your symptoms, such as: ? Skin prick test. In this test, your skin is pricked with a tiny needle and exposed to small amounts of possible allergens to see if your skin reacts. ? Intradermal skin test. In this test, a small amount of allergen is injected under your skin to see if your skin reacts. ? Patch test. In this test, a small amount of allergen is placed on your skin and then your skin is covered with a bandage. Your health care provider will check your skin after a couple of days to see if a rash has developed.  Blood tests.  Challenges tests. In this test, you inhale a small amount of allergen by mouth to see if you have an allergic reaction.  You may also be asked to:  Keep a food diary. A food diary is a record of all the foods and drinks you have in a day and any symptoms you experience.  Practice an elimination diet. An elimination diet involves eliminating specific foods from your diet and then adding them back in one by one to find out if a certain food causes an allergic reaction.  How is this treated? Treatment for allergies depends on your symptoms. Treatment may include:  Cold compresses to soothe itching and swelling.  Eye drops.  Nasal sprays.  Using a saline spray or container (neti pot) to flush out the nose (nasal irrigation). These methods can help clear away mucus and keep the nasal passages moist.  Using  a humidifier.  Oral antihistamines or other medicines to block allergic reaction and inflammation.  Skin creams to treat rashes or itching.  Diet changes to eliminate food allergy triggers.  Repeated exposure to tiny amounts of allergens to build up a tolerance and prevent future allergic reactions (immunotherapy). These include: ? Allergy shots. ? Oral treatment. This involves taking small doses of an allergen  under the tongue (sublingual immunotherapy).  Emergency epinephrine injection (auto-injector) in case of an allergic emergency. This is a self-injectable, pre-measured medicine that must be given within the first few minutes of a serious allergic reaction.  Follow these instructions at home:  Avoid known allergens whenever possible.  If you suffer from airborne allergens, wash out your nose daily. You can do this with a saline spray or a neti pot to flush out your nose (nasal irrigation).  Take over-the-counter and prescription medicines only as told by your health care provider.  Keep all follow-up visits as told by your health care provider. This is important.  If you are at risk of a severe allergic reaction (anaphylaxis), keep your auto-injector with you at all times.  If you have ever had anaphylaxis, wear a medical alert bracelet or necklace that states you have a severe allergy. Contact a health care provider if:  Your symptoms do not improve with treatment. Get help right away if:  You have symptoms of anaphylaxis, such as: ? Swollen mouth, tongue, or throat. ? Pain or tightness in your chest. ? Trouble breathing or shortness of breath. ? Dizziness or fainting. ? Severe abdominal pain, vomiting, or diarrhea. This information is not intended to replace advice given to you by your health care provider. Make sure you discuss any questions you have with your health care provider. Document Released: 08/04/2002 Document Revised: 09/09/2016 Document Reviewed: 11/27/2015 Elsevier Interactive Patient Education  [employer].

## 2017-06-16 NOTE — Progress Notes (Signed)
Chief Complaint  Patient presents with  . Establish Care   Establish/physical  1. C/o post nasal drip esp in am and allergies using nasacort and claritin but still has and also assoc ear pressure  2. HTN controlled on Lis20 hctz 12.5 qd 3. H/o snoring but w/o apnea never had sleep study    Review of Systems  Constitutional: Negative for weight loss.  HENT:       +post nasal drip with allergies esp in am  +ear pressure   Eyes:       No vision changes   Respiratory: Positive for cough.   Cardiovascular: Negative for chest pain.  Gastrointestinal: Positive for constipation.  Musculoskeletal:       +b/l plantar fascitis L>R  Sees Dr. Jacqualyn Posey  Takes Aleve Ibuprofen prn   Skin:       Red dot on nose +toenail fungus   Neurological: Negative for headaches.  Endo/Heme/Allergies:       LMP 3 years ago   Psychiatric/Behavioral: Negative for memory loss.   Past Medical History:  Diagnosis Date  . Allergy   . GERD (gastroesophageal reflux disease)   . Hypertension   . Left tennis elbow   . Morbid obesity ([place]) 07/13/2016   Past Surgical History:  Procedure Laterality Date  . COLONOSCOPY    . COLONOSCOPY WITH PROPOFOL N/A 10/14/2015   Procedure: COLONOSCOPY WITH PROPOFOL;  Surgeon: Lucilla Lame, MD;  Location: [facility];  Service: Endoscopy;  Laterality: N/A;   Family History  Problem Relation Age of Onset  . COPD Mother   . Heart failure Mother   . Pancreatic cancer Mother   . Cancer Mother        pancretic  . Osteopenia Mother   . Cancer Father        lung/kidney  . Kidney cancer Father   . Hypertension Father   . Cancer Paternal Aunt        colon and breast cancer in 2 aunts  . Breast cancer Paternal Aunt 19  . Breast cancer Cousin   . Hypertension Brother   . Atrial fibrillation Brother   . Heart disease Maternal Grandmother   . Cancer Maternal Grandfather        lukemia   Social History   Socioeconomic History  . Marital status: Married    Spouse  name: Not on file  . Number of children: Not on file  . Years of education: Not on file  . Highest education level: Not on file  Social Needs  . Financial resource strain: Not on file  . Food insecurity - worry: Not on file  . Food insecurity - inability: Not on file  . Transportation needs - medical: Not on file  . Transportation needs - non-medical: Not on file  Occupational History  . Not on file  Tobacco Use  . Smoking status: Never Smoker  . Smokeless tobacco: Never Used  Substance and Sexual Activity  . Alcohol use: Yes    Alcohol/week: 5.4 oz    Types: 9 Glasses of wine per week  . Drug use: No  . Sexual activity: Yes  Other Topics Concern  . Not on file  Social History Narrative   Diet: moderate.. Working on improving .   Walks 3-4 times a week.    Current Meds  Medication Sig  . cyclobenzaprine (FLEXERIL) 5 MG tablet Take 1 tablet (5 mg total) by mouth 3 (three) times daily as needed for muscle spasms.  Marland Kitchen etodolac (  LODINE) 500 MG tablet etodolac 500 mg tablet  . loratadine (CLARITIN) 10 MG tablet Take 10 mg by mouth daily.  Salley Scarlet [facility]  Onychomycosis Nail Lacquer -  Fluconazole 2%, Terbinafine 1% DMSO Apply to affected nail once daily Qty. 120 gm 3 refills  . omeprazole (PRILOSEC) 10 MG capsule Take 20 mg by mouth daily.  . Probiotic Product (PROBIOTIC DAILY PO) Take by mouth.  . triamcinolone (NASACORT) 55 MCG/ACT AERO nasal inhaler Place 1 spray into the nose as needed.   . [DISCONTINUED] hydrochlorothiazide (HYDRODIURIL) 12.5 MG tablet TAKE 1 TABLET (12.5 MG TOTAL) BY MOUTH DAILY.  . [DISCONTINUED] lisinopril (PRINIVIL,ZESTRIL) 20 MG tablet TAKE 1 TABLET (20 MG TOTAL) BY MOUTH DAILY.   Allergies  Allergen Reactions  . Mobic [Meloxicam] Swelling    In hands and ankles and under eyes.  . Erythromycin     Rash, GI upset  . Erythromycin Base   . Influenza Vac Split Quad     Possible allergy- rash after injection  . Sulfa Antibiotics    . Azithromycin Rash   No results found for this or any previous visit (from the past 2160 hour(s)). Objective  Body mass index is 44.13 kg/m. Wt Readings from Last 3 Encounters:  06/16/17 241 lb 4 oz (109.4 kg)  02/11/17 234 lb (106.1 kg)  07/30/16 236 lb (107 kg)   Temp Readings from Last 3 Encounters:  06/16/17 98.7 F (37.1 C) (Oral)  02/11/17 98.1 F (36.7 C) (Tympanic)  07/30/16 98 F (36.7 C) (Tympanic)   BP Readings from Last 3 Encounters:  06/16/17 134/64  02/11/17 122/72  07/30/16 136/74   Pulse Readings from Last 3 Encounters:  06/16/17 81  02/11/17 85  07/30/16 84   O2 sat room air 98%  Physical Exam  Constitutional: She is oriented to person, place, and time and well-developed, well-nourished, and in no distress. Vital signs are normal.  HENT:  Head: Normocephalic and atraumatic.  Mouth/Throat: Oropharynx is clear and moist and mucous membranes are normal.  Eyes: Conjunctivae are normal. Pupils are equal, round, and reactive to light.  Cardiovascular: Normal rate, regular rhythm and normal heart sounds.  Pulmonary/Chest: Effort normal and breath sounds normal. Right breast exhibits no inverted nipple, no mass, no nipple discharge, no skin change and no tenderness. Left breast exhibits no inverted nipple, no mass, no nipple discharge, no skin change and no tenderness. Breasts are symmetrical.  Abdominal: Soft. Bowel sounds are normal. There is no tenderness.  Neurological: She is alert and oriented to person, place, and time. Gait normal. Gait normal.  Skin: Skin is warm, dry and intact. No rash noted.  Psychiatric: Mood, memory, affect and judgment normal.  Nursing note and vitals reviewed.   Assessment   1. Annual physical  2. HTN controlloed  3. Allergic rhinitis  4. HM Plan  1. CMET, CBC, lipid, UA, TSH, T4 declines hep B testing. [facility] form filled out for [facility] Per pt prev testing hep c though I do not see record consider for future  2. Cont  meds lis 20-hct 12.5 mg qd  3. Cont nasacort, claritin, add singular qhs if this does not help consider add atrovent  4. Declines flu allergic  Td due 10/2019 Disc shingrix will check with [place]   Referred mammo [place] due 09/09/17 Pap neg 07/13/16 repeat in 06/2019 Colonoscopy had 10/19/15 Dr. Allen Norris repeat in 5 years had int hemorrhoids  Dermatology appt tomorrow with [facility] Skin using sunscreen congratulated Never smoker  congratulated   Provider: Dr. Olivia Mackie McLean-Scocuzza-Internal Medicine

## 2017-06-17 DIAGNOSIS — D485 Neoplasm of uncertain behavior of skin: Secondary | ICD-10-CM | POA: Diagnosis not present

## 2017-06-17 DIAGNOSIS — C44519 Basal cell carcinoma of skin of other part of trunk: Secondary | ICD-10-CM | POA: Diagnosis not present

## 2017-06-17 DIAGNOSIS — C4491 Basal cell carcinoma of skin, unspecified: Secondary | ICD-10-CM

## 2017-06-17 DIAGNOSIS — D229 Melanocytic nevi, unspecified: Secondary | ICD-10-CM | POA: Diagnosis not present

## 2017-06-17 DIAGNOSIS — B359 Dermatophytosis, unspecified: Secondary | ICD-10-CM | POA: Diagnosis not present

## 2017-06-17 DIAGNOSIS — B078 Other viral warts: Secondary | ICD-10-CM | POA: Diagnosis not present

## 2017-06-17 DIAGNOSIS — L570 Actinic keratosis: Secondary | ICD-10-CM | POA: Diagnosis not present

## 2017-06-17 HISTORY — DX: Basal cell carcinoma of skin, unspecified: C44.91

## 2017-06-23 ENCOUNTER — Other Ambulatory Visit: Payer: BLUE CROSS/BLUE SHIELD

## 2017-06-23 DIAGNOSIS — Z0000 Encounter for general adult medical examination without abnormal findings: Secondary | ICD-10-CM

## 2017-06-24 LAB — CBC WITH DIFFERENTIAL/PLATELET
BASOS ABS: 0.1 10*3/uL (ref 0.0–0.2)
BASOS: 1 %
EOS (ABSOLUTE): 0.2 10*3/uL (ref 0.0–0.4)
Eos: 2 %
HEMOGLOBIN: 14.9 g/dL (ref 11.1–15.9)
Hematocrit: 44.7 % (ref 34.0–46.6)
IMMATURE GRANS (ABS): 0 10*3/uL (ref 0.0–0.1)
Immature Granulocytes: 0 %
LYMPHS: 21 %
Lymphocytes Absolute: 1.8 10*3/uL (ref 0.7–3.1)
MCH: 29 pg (ref 26.6–33.0)
MCHC: 33.3 g/dL (ref 31.5–35.7)
MCV: 87 fL (ref 79–97)
MONOCYTES: 8 %
Monocytes Absolute: 0.7 10*3/uL (ref 0.1–0.9)
NEUTROS ABS: 5.9 10*3/uL (ref 1.4–7.0)
Neutrophils: 68 %
Platelets: 247 10*3/uL (ref 150–379)
RBC: 5.13 x10E6/uL (ref 3.77–5.28)
RDW: 13.9 % (ref 12.3–15.4)
WBC: 8.6 10*3/uL (ref 3.4–10.8)

## 2017-06-24 LAB — COMPREHENSIVE METABOLIC PANEL
ALK PHOS: 93 IU/L (ref 39–117)
ALT: 24 IU/L (ref 0–32)
AST: 14 IU/L (ref 0–40)
Albumin/Globulin Ratio: 1.8 (ref 1.2–2.2)
Albumin: 4.3 g/dL (ref 3.5–5.5)
BUN/Creatinine Ratio: 20 (ref 9–23)
BUN: 13 mg/dL (ref 6–24)
Bilirubin Total: 0.6 mg/dL (ref 0.0–1.2)
CALCIUM: 9.7 mg/dL (ref 8.7–10.2)
CO2: 26 mmol/L (ref 20–29)
CREATININE: 0.64 mg/dL (ref 0.57–1.00)
Chloride: 102 mmol/L (ref 96–106)
GFR calc Af Amer: 116 mL/min/{1.73_m2} (ref >59)
GFR, EST NON AFRICAN AMERICAN: 101 mL/min/{1.73_m2} (ref >59)
Globulin, Total: 2.4 g/dL (ref 1.5–4.5)
Glucose: 94 mg/dL (ref 65–99)
POTASSIUM: 4.4 mmol/L (ref 3.5–5.2)
Sodium: 140 mmol/L (ref 134–144)
Total Protein: 6.7 g/dL (ref 6.0–8.5)

## 2017-06-24 LAB — MICROSCOPIC EXAMINATION: Casts: NONE SEEN /lpf

## 2017-06-24 LAB — URINALYSIS, ROUTINE W REFLEX MICROSCOPIC
Bilirubin, UA: NEGATIVE
GLUCOSE, UA: NEGATIVE
Ketones, UA: NEGATIVE
NITRITE UA: NEGATIVE
Protein, UA: NEGATIVE
RBC, UA: NEGATIVE
Specific Gravity, UA: 1.007 (ref 1.005–1.030)
UUROB: 0.2 mg/dL (ref 0.2–1.0)
pH, UA: 6.5 (ref 5.0–7.5)

## 2017-06-24 LAB — TSH+FREE T4
Free T4: 1.42 ng/dL (ref 0.82–1.77)
TSH: 3.19 u[IU]/mL (ref 0.450–4.500)

## 2017-06-24 LAB — LIPID PANEL
CHOL/HDL RATIO: 3.3 ratio (ref 0.0–4.4)
Cholesterol, Total: 162 mg/dL (ref 100–199)
HDL: 49 mg/dL (ref >39)
LDL CALC: 97 mg/dL (ref 0–99)
TRIGLYCERIDES: 82 mg/dL (ref 0–149)
VLDL Cholesterol Cal: 16 mg/dL (ref 5–40)

## 2017-06-29 ENCOUNTER — Encounter: Payer: Self-pay | Admitting: Medical

## 2017-06-29 ENCOUNTER — Ambulatory Visit: Payer: BLUE CROSS/BLUE SHIELD | Admitting: Medical

## 2017-06-29 VITALS — BP 147/71 | HR 71 | Temp 97.8°F | Resp 18 | Wt 239.0 lb

## 2017-06-29 DIAGNOSIS — B373 Candidiasis of vulva and vagina: Secondary | ICD-10-CM

## 2017-06-29 DIAGNOSIS — B349 Viral infection, unspecified: Secondary | ICD-10-CM

## 2017-06-29 DIAGNOSIS — B3731 Acute candidiasis of vulva and vagina: Secondary | ICD-10-CM

## 2017-06-29 MED ORDER — FLUCONAZOLE 150 MG PO TABS: 150.0000 mg | ORAL_TABLET | Freq: Once | ORAL | 0 refills | Status: AC

## 2017-06-29 MED ORDER — AMOXICILLIN-POT CLAVULANATE 875-125 MG PO TABS: 1.0000 | ORAL_TABLET | Freq: Two times a day (BID) | ORAL | 0 refills | Status: DC

## 2017-06-29 NOTE — Patient Instructions (Signed)
Viral Illness, Adult Viruses are tiny germs that can get into a person's body and cause illness. There are many different types of viruses, and they cause many types of illness. Viral illnesses can range from mild to severe. They can affect various parts of the body. Common illnesses that are caused by a virus include colds and the flu. Viral illnesses also include serious conditions such as HIV/AIDS (human immunodeficiency virus/acquired immunodeficiency syndrome). A few viruses have been linked to certain cancers. What are the causes? Many types of viruses can cause illness. Viruses invade cells in your body, multiply, and cause the infected cells to malfunction or die. When the cell dies, it releases more of the virus. When this happens, you develop symptoms of the illness, and the virus continues to spread to other cells. If the virus takes over the function of the cell, it can cause the cell to divide and grow out of control, as is the case when a virus causes cancer. Different viruses get into the body in different ways. You can get a virus by:  Swallowing food or water that is contaminated with the virus.  Breathing in droplets that have been coughed or sneezed into the air by an infected person.  Touching a surface that has been contaminated with the virus and then touching your eyes, nose, or mouth.  Being bitten by an insect or animal that carries the virus.  Having sexual contact with a person who is infected with the virus.  Being exposed to blood or fluids that contain the virus, either through an open cut or during a transfusion.  If a virus enters your body, your body's defense system (immune system) will try to fight the virus. You may be at higher risk for a viral illness if your immune system is weak. What are the signs or symptoms? Symptoms vary depending on the type of virus and the location of the cells that it invades. Common symptoms of the main types of viral illnesses  include: Cold and flu viruses  Fever.  Headache.  Sore throat.  Muscle aches.  Nasal congestion.  Cough. Digestive system (gastrointestinal) viruses  Fever.  Abdominal pain.  Nausea.  Diarrhea. Liver viruses (hepatitis)  Loss of appetite.  Tiredness.  Yellowing of the skin (jaundice). Brain and spinal cord viruses  Fever.  Headache.  Stiff neck.  Nausea and vomiting.  Confusion or sleepiness. Skin viruses  Warts.  Itching.  Rash. Sexually transmitted viruses  Discharge.  Swelling.  Redness.  Rash. How is this treated? Viruses can be difficult to treat because they live within cells. Antibiotic medicines do not treat viruses because these drugs do not get inside cells. Treatment for a viral illness may include:  Resting and drinking plenty of fluids.  Medicines to relieve symptoms. These can include over-the-counter medicine for pain and fever, medicines for cough or congestion, and medicines to relieve diarrhea.  Antiviral medicines. These drugs are available only for certain types of viruses. They may help reduce flu symptoms if taken early. There are also many antiviral medicines for hepatitis and HIV/AIDS.  Some viral illnesses can be prevented with vaccinations. A common example is the flu shot. Follow these instructions at home: Medicines   Take over-the-counter and prescription medicines only as told by your health care provider.  If you were prescribed an antiviral medicine, take it as told by your health care provider. Do not stop taking the medicine even if you start to feel better.  Be aware   of when antibiotics are needed and when they are not needed. Antibiotics do not treat viruses. If your health care provider thinks that you may have a bacterial infection as well as a viral infection, you may get an antibiotic. ? Do not ask for an antibiotic prescription if you have been diagnosed with a viral illness. That will not make your  illness go away faster. ? Frequently taking antibiotics when they are not needed can lead to antibiotic resistance. When this develops, the medicine no longer works against the bacteria that it normally fights. General instructions  Drink enough fluids to keep your urine clear or pale yellow.  Rest as much as possible.  Return to your normal activities as told by your health care provider. Ask your health care provider what activities are safe for you.  Keep all follow-up visits as told by your health care provider. This is important. How is this prevented? Take these actions to reduce your risk of viral infection:  Eat a healthy diet and get enough rest.  Wash your hands often with soap and water. This is especially important when you are in public places. If soap and water are not available, use hand sanitizer.  Avoid close contact with friends and family who have a viral illness.  If you travel to areas where viral gastrointestinal infection is common, avoid drinking water or eating raw food.  Keep your immunizations up to date. Get a flu shot every year as told by your health care provider.  Do not share toothbrushes, nail clippers, razors, or needles with other people.  Always practice safe sex.  Contact a health care provider if:  You have symptoms of a viral illness that do not go away.  Your symptoms come back after going away.  Your symptoms get worse. Get help right away if:  You have trouble breathing.  You have a severe headache or a stiff neck.  You have severe vomiting or abdominal pain. This information is not intended to replace advice given to you by your health care provider. Make sure you discuss any questions you have with your health care provider. Document Released: 09/20/2015 Document Revised: 10/23/2015 Document Reviewed: 09/20/2015 Elsevier Interactive Patient Education  2018 [employer]  

## 2017-06-29 NOTE — Progress Notes (Signed)
   Subjective:    Patient ID: Bridget Reed, female    DOB: 1960/07/14, 57 y.o.   MRN: [id number]  HPI  57 yo female in non acute distress. Started Sunday with pressure on bridge of nose, with a headache top  Of head. No discharge, Slightly blood after blowing nose. Denies fever chills mild morning PND cough in which she take Singulair but forgets to take it. Taking Nasocort daily.   Review of Systems  HENT: Positive for congestion, ear pain (both a little left worse than right), sinus pressure and sinus pain. Negative for sore throat.   Eyes: Negative for discharge.  Respiratory: Positive for cough. Negative for shortness of breath.   Cardiovascular: Negative for chest pain.  Gastrointestinal: Negative for abdominal pain.  Genitourinary: Negative for dysuria.  Musculoskeletal: Negative for myalgias.  Skin: Negative for rash.  Allergic/Immunologic: Positive for environmental allergies and food allergies (possible gets headache with shrimp).  Neurological: Negative for dizziness, syncope and light-headedness.  Hematological: Negative for adenopathy.  Psychiatric/Behavioral: Negative for behavioral problems, self-injury and suicidal ideas. The patient is not nervous/anxious.        Objective:   Physical Exam  Constitutional: She is oriented to person, place, and time. She appears well-developed and well-nourished.  HENT:  Head: Normocephalic and atraumatic.  Right Ear: Hearing, tympanic membrane, external ear and ear canal normal.  Left Ear: Hearing, external ear and ear canal normal. A middle ear effusion is present.  Nose: Rhinorrhea present.  Mouth/Throat: Uvula is midline, oropharynx is clear and moist and mucous membranes are normal.  Eyes: Conjunctivae and EOM are normal. Pupils are equal, round, and reactive to light.  Neck: Normal range of motion. Neck supple.  Cardiovascular: Normal rate, regular rhythm and normal heart sounds.  Pulmonary/Chest: Effort normal and breath  sounds normal.  Lymphadenopathy:    She has no cervical adenopathy.  Neurological: She is alert and oriented to person, place, and time.  Skin: Skin is warm and dry.  Psychiatric: She has a normal mood and affect. Her behavior is normal. Judgment and thought content normal.  Nursing note and vitals reviewed.         Assessment & Plan:  Viral illness Reviewed sisgns and symptoms of a bacterial infection, wrote for antibiotic if patient needs it.  She is well educated in Sinusitis infections. If she ends up starting antibiotics she should follow up in the clinic in  3-5 days if not improving.   Meds ordered this encounter  Medications  . amoxicillin-clavulanate (AUGMENTIN) 875-125 MG tablet    Sig: Take 1 tablet by mouth 2 (two) times daily.    Dispense:  20 tablet    Refill:  0  . fluconazole (DIFLUCAN) 150 MG tablet    Sig: Take 1 tablet (150 mg total) by mouth once for 1 dose.    Dispense:  1 tablet    Refill:  0  Return to the clinic as needed.   Patient verbalizes understanding and has no questions at discharge.

## 2017-07-16 ENCOUNTER — Encounter: Payer: BLUE CROSS/BLUE SHIELD | Admitting: Family Medicine

## 2017-07-29 DIAGNOSIS — C44519 Basal cell carcinoma of skin of other part of trunk: Secondary | ICD-10-CM | POA: Diagnosis not present

## 2017-07-29 DIAGNOSIS — L570 Actinic keratosis: Secondary | ICD-10-CM | POA: Diagnosis not present

## 2017-08-11 ENCOUNTER — Other Ambulatory Visit: Payer: Self-pay | Admitting: Family Medicine

## 2017-09-21 ENCOUNTER — Encounter: Payer: Self-pay | Admitting: Podiatry

## 2017-09-21 ENCOUNTER — Ambulatory Visit: Payer: BLUE CROSS/BLUE SHIELD | Admitting: Podiatry

## 2017-09-21 DIAGNOSIS — Z79899 Other long term (current) drug therapy: Secondary | ICD-10-CM

## 2017-09-21 DIAGNOSIS — B351 Tinea unguium: Secondary | ICD-10-CM

## 2017-09-21 MED ORDER — TERBINAFINE HCL 250 MG PO TABS: 250.0000 mg | ORAL_TABLET | Freq: Every day | ORAL | 0 refills | Status: DC

## 2017-09-22 LAB — HEPATIC FUNCTION PANEL
ALT: 29 IU/L (ref 0–32)
AST: 19 IU/L (ref 0–40)
Albumin: 4.4 g/dL (ref 3.5–5.5)
Alkaline Phosphatase: 85 IU/L (ref 39–117)
Bilirubin Total: 0.5 mg/dL (ref 0.0–1.2)
Bilirubin, Direct: 0.15 mg/dL (ref 0.00–0.40)
Total Protein: 6.6 g/dL (ref 6.0–8.5)

## 2017-09-23 NOTE — Progress Notes (Signed)
   Subjective: 57 year old female presenting today with a chief complaint of fungus to the toenails of the right foot. She has had 5 laser treatments, used antifungal cream and nail lacquer, all with no significant relief. There are no modifying factors noted. Patient is here for further evaluation and treatment.   Past Medical History:  Diagnosis Date  . Allergy   . GERD (gastroesophageal reflux disease)   . Hypertension   . Left tennis elbow   . Morbid obesity ([place]) 07/13/2016    Objective: Physical Exam General: The patient is alert and oriented x3 in no acute distress.  Dermatology: Hyperkeratotic, discolored, thickened, onychodystrophy of nails noted, 1-5 right. Skin is warm, dry and supple bilateral lower extremities. Negative for open lesions or macerations.  Vascular: Palpable pedal pulses bilaterally. No edema or erythema noted. Capillary refill within normal limits.  Neurological: Epicritic and protective threshold grossly intact bilaterally.   Musculoskeletal Exam: Range of motion within normal limits to all pedal and ankle joints bilateral. Muscle strength 5/5 in all groups bilateral.   Assessment: #1 onychomycosis 1-5 right   Plan of Care:  #1 Patient was evaluated. #2 Orders for liver function tests were placed today.  #3 Prescription for Lamisil 250 mg #90 provided to patient.  #4 Return to clinic in 6 weeks.    Edrick Kins, DPM [facility]  Dr. Edrick Kins, [address]                                        [place], [place] [address]                Office [phone]  Fax [phone]

## 2017-11-10 ENCOUNTER — Ambulatory Visit: Payer: BLUE CROSS/BLUE SHIELD | Admitting: Medical

## 2017-11-10 VITALS — BP 149/79 | HR 101 | Temp 98.2°F | Resp 16 | Wt 239.2 lb

## 2017-11-10 DIAGNOSIS — L03818 Cellulitis of other sites: Secondary | ICD-10-CM

## 2017-11-10 NOTE — Patient Instructions (Addendum)
Return on Friday for a recheck  Cellulitis, Adult Cellulitis is a skin infection. The infected area is usually red and sore. This condition occurs most often in the arms and lower legs. It is very important to get treated for this condition. Follow these instructions at home:  Take over-the-counter and prescription medicines only as told by your doctor.  If you were prescribed an antibiotic medicine, take it as told by your doctor. Do not stop taking the antibiotic even if you start to feel better.  Drink enough fluid to keep your pee (urine) clear or pale yellow.  Do not touch or rub the infected area.  Raise (elevate) the infected area above the level of your heart while you are sitting or lying down.  Place warm or cold wet cloths (warm or cold compresses) on the infected area. Do this as told by your doctor.  Keep all follow-up visits as told by your doctor. This is important. These visits let your doctor make sure your infection is not getting worse. Contact a doctor if:  You have a fever.  Your symptoms do not get better after 1-2 days of treatment.  Your bone or joint under the infected area starts to hurt after the skin has healed.  Your infection comes back. This can happen in the same area or another area.  You have a swollen bump in the infected area.  You have new symptoms.  You feel ill and also have muscle aches and pains. Get help right away if:  Your symptoms get worse.  You feel very sleepy.  You throw up (vomit) or have watery poop (diarrhea) for a long time.  There are red streaks coming from the infected area.  Your red area gets larger.  Your red area turns darker. This information is not intended to replace advice given to you by your health care provider. Make sure you discuss any questions you have with your health care provider. Document Released: 10/28/2007 Document Revised: 10/17/2015 Document Reviewed: 03/20/2015 Elsevier Interactive  Patient Education  2018 [employer].

## 2017-11-10 NOTE — Progress Notes (Signed)
   Subjective:    Patient ID: Bridget Reed, female    DOB: Jan 04, 1961, 57 y.o.   MRN: [id number]  HPI  57 yo female in non acute distress. Started with a red area on her right  Breast about 2 weeks ago that continued to get worse.  It is now also red and warm. Denies any fever or chills or chest pain or shortness of breath. Not painful , negative for itching. Thinks it possible is an insect bite but not sure. She kep forgetting about it because it "dosen't bother me."  No history of MRSA.  Review of Systems  Constitutional: Negative for chills and fever.  Skin: Positive for color change.  right breast no discharge or pain.     Objective:   Physical Exam  Constitutional: She is oriented to person, place, and time. She appears well-developed and well-nourished.  HENT:  Head: Normocephalic and atraumatic.  Eyes: Pupils are equal, round, and reactive to light. Conjunctivae and EOM are normal.  Cardiovascular: Normal rate, regular rhythm and normal heart sounds.  Pulmonary/Chest: Effort normal and breath sounds normal.  Neurological: She is alert and oriented to person, place, and time.  Skin: Skin is warm and dry. There is erythema.   No nipple discharge  Psychiatric: She has a normal mood and affect. Her behavior is normal. Judgment and thought content normal.  Nursing note and vitals reviewed.   No axilllary nodes on right side. At 4 O'Clock on the right breast is 7 x 6 cm circular area of erythema. Marked area with a marker for patient to watch.      Assessment & Plan:  Cellulitis of the right breast  Keflex 500mg  one po QID x 10days #40 no rx.  Apply warm compresses to the area.  Return on Friday or sooner if needed for a recheck.  Patient verbalizes understaning and has no questions at discharge.Marland Kitchen

## 2017-11-12 ENCOUNTER — Ambulatory Visit: Payer: BLUE CROSS/BLUE SHIELD | Admitting: Adult Health

## 2017-11-12 VITALS — BP 156/82 | HR 97 | Temp 97.9°F | Resp 16 | Ht 62.0 in | Wt 239.0 lb

## 2017-11-12 DIAGNOSIS — L03818 Cellulitis of other sites: Secondary | ICD-10-CM

## 2017-11-12 NOTE — Progress Notes (Addendum)
Subjective:     Patient ID: Bridget Reed, female   DOB: [birth year], 57 y.o.   MRN: [id number]  HPI   Blood pressure (!) 156/82, pulse 97, temperature 97.9 F (36.6 C), resp. rate 16, height 5\' 2"  (1.575 m), weight 239 lb (108.4 kg), last menstrual period 11/22/2013, SpO2 98 %. Patient is a 57 year old female in no acute distress who comes to the clinic in no acute distress for recheck on right breast. Patient is here for recheck. She noticed red area on breast noticed two weeks ago. She feels she got bit by something while sitting outside before all this occurred.   She is on Keflex four  times a day for 10 days and was given a handwritten scrip as computers were down when she was on office at last visit.   Patient reports area is looking much better than previously was at last visit. Denies any pain.    She took two tablets on Wednesday and then three tablets on Thursday and was not able to get in the full dose. Last Mammogram April 2018 - normal per radiologist. She is due for a mammogram and it is ordered by her McLean-Scocuzza, Nino Glow, MD. She denies any breast cancer history or abnormal breast exams.   Patient  denies any fever, body aches,chills, rash, chest pain, shortness of breath, nausea, vomiting, or diarrhea.     Review of Systems  Constitutional: Negative for activity change, appetite change, chills, diaphoresis, fatigue, fever and unexpected weight change.  Cardiovascular: Negative for chest pain, palpitations and leg swelling.  Gastrointestinal: Negative.   Musculoskeletal: Negative.   Skin: Positive for color change. Negative for pallor, rash and wound.  Neurological: Negative.   Hematological: Negative.   Psychiatric/Behavioral: Negative.        Objective:   Physical Exam  Constitutional: She appears well-developed and well-nourished. No distress.  HENT:  Head: Normocephalic and atraumatic.  Mouth/Throat: No oropharyngeal exudate.  Eyes: Pupils are  equal, round, and reactive to light.  Neck: Normal range of motion. Neck supple.  Cardiovascular: Normal rate, regular rhythm, normal heart sounds and intact distal pulses. Exam reveals no gallop and no friction rub.  No murmur heard. Pulmonary/Chest: Effort normal and breath sounds normal. No stridor. No respiratory distress. She has no wheezes. She has no rales. She exhibits no tenderness. Right breast exhibits skin change (No axilllary nodes on right side.). Right breast exhibits no inverted nipple, no mass, no nipple discharge and no tenderness. No breast swelling, tenderness, discharge or bleeding. Breasts are symmetrical.    Lymphadenopathy:       Right axillary: No pectoral and no lateral adenopathy present.  Skin: Skin is warm and dry. Capillary refill takes less than 2 seconds. No rash noted. She is not diaphoretic. There is erythema. No pallor.  Psychiatric: She has a normal mood and affect. Her behavior is normal. Judgment and thought content normal.  Vitals reviewed.   No axilllary nodes on right side. At 4 O'Clock on the right breast is 5x 5  cm circular area of erythema mostly center, pink on outer area. Less than dime size induration central. No drainage, warmth of skin or temperatiure change.  Area is smaller than previously     Assessment & Plan:  Cellulitis of the right breast - possible bug bite   Continue Keflex 500mg  one po QID x 10days   Apply warm compresses to the area.  Return Monday 11/15/17   or sooner if needed for  a recheck..  Take antibiotics as prescribed.  Advised patient call the office or your primary care doctor for an appointment if no improvement within 72 hours or if any symptoms change or worsen at any time  Advised ER or urgent Care if after hours or on weekend. Call 911 for emergency symptoms at any time.Patinet verbalized understanding of all instructions given/reviewed and treatment plan and has no further questions or concerns at this time.     Patient verbalized understanding of all instructions given and denies any further questions at this time.

## 2017-11-15 ENCOUNTER — Telehealth: Payer: Self-pay | Admitting: *Deleted

## 2017-11-15 ENCOUNTER — Encounter: Payer: Self-pay | Admitting: Adult Health

## 2017-11-15 ENCOUNTER — Ambulatory Visit: Payer: BLUE CROSS/BLUE SHIELD | Admitting: Adult Health

## 2017-11-15 VITALS — BP 151/83 | HR 97 | Temp 97.4°F | Resp 18 | Wt 241.0 lb

## 2017-11-15 DIAGNOSIS — L03818 Cellulitis of other sites: Secondary | ICD-10-CM

## 2017-11-15 MED ORDER — DOXYCYCLINE HYCLATE 100 MG PO TABS: 100.0000 mg | ORAL_TABLET | Freq: Two times a day (BID) | ORAL | 0 refills | Status: DC

## 2017-11-15 NOTE — Progress Notes (Addendum)
Subjective:     Patient ID: Bridget Reed, female   DOB: Dec 17, 1960, 57 y.o.   MRN: [id number]  HPI   Blood pressure (!) 151/83, pulse 97, temperature (!) 97.4 F (36.3 C), temperature source Tympanic, resp. rate 18, weight 241 lb (109.3 kg), last menstrual period 11/22/2013, SpO2 98 %.  Patient is a 57 year old female in no acute distress who comes to the clinic for follow up from initial visit 11/10/17 with Liz Malady PA-C and last visit where she had not yet had a full two days of antibiotics.  She still admits to missing some doses of antibiotics as she has had a hard time taking four times daily.   Area on right breast still remains basically unchanged from visit 11/12/17. Patient reports it may be " a little better". She has been soaking in eposon salt which may be irritating her skin more as it is dry in one area with mild peeling that was not present last week.   Patient  denies any fever, body aches,chills, rash, chest pain, shortness of breath, nausea, vomiting, or diarrhea.     Review of Systems  Constitutional: Negative.   HENT: Negative.   Eyes: Negative.   Respiratory: Negative.   Cardiovascular: Negative.   Gastrointestinal: Negative.   Endocrine: Negative.   Genitourinary: Negative.   Musculoskeletal: Negative.   Skin: Positive for pallor. Negative for color change, rash and wound.  Allergic/Immunologic: Negative.   Neurological: Negative.   Hematological: Negative.   Psychiatric/Behavioral: Negative.        Objective:   Physical Exam  Constitutional: She is oriented to person, place, and time. She appears well-developed and well-nourished. No distress.  HENT:  Head: Normocephalic and atraumatic.  Mouth/Throat: No oropharyngeal exudate.  Eyes: Pupils are equal, round, and reactive to light.  Neck: Normal range of motion. Neck supple.  Cardiovascular: Normal rate, regular rhythm, normal heart sounds and intact distal pulses. Exam reveals no gallop  and no friction rub.  No murmur heard. Pulmonary/Chest: Effort normal and breath sounds normal. No stridor. No respiratory distress. She has no wheezes. She has no rales. She exhibits no tenderness. Right breast exhibits skin change (No axilllary nodes on right side.). Right breast exhibits no inverted nipple, no mass, no nipple discharge and no tenderness. No breast swelling, tenderness, discharge or bleeding. Breasts are symmetrical.    Genitourinary: No breast swelling, tenderness, discharge or bleeding.  Lymphadenopathy:       Right axillary: No pectoral and no lateral adenopathy present.  Neurological: She is alert and oriented to person, place, and time.  Skin: Skin is warm and dry. Capillary refill takes less than 2 seconds. No rash noted. She is not diaphoretic. There is erythema. No pallor.  Psychiatric: She has a normal mood and affect. Her behavior is normal. Judgment and thought content normal.  Vitals reviewed.   No axilllary nodes on right side. At 4 O'Clock on the right breast is 5x 5  cm circular area of erythema throughout sized area, mild peeling skin in center. Pea sized induration soft central of area- smaller than previous visit. No drainage. No warmth or temperature change of skin. No change in size from previous visit.      Assessment:     Cellulitis of other specified site      Plan:      Meds ordered this encounter  Medications  . doxycycline (VIBRA-TABS) 100 MG tablet    Sig: Take 1 tablet (100 mg total) by  mouth 2 (two) times daily. Causes sun sensitivity.    Dispense:  20 tablet    Refill:  0   Educated to avoid sun exposure and wear sun screen as cases sun sensitivity.   Discontinue Keflex. Discontinue Epson salt soaks. Discussed need for close follow up and other possible differentials with patient who verbalized understanding.   She is going out of town for next three days and will seek care if worsens while out of town. She will return for  recheck on Friday 11/19/17. Again recommend follow up with primary care and schedule mammogram as due in April 2019.  Provider thoroughly discussed in collaboration above plan with supervising physician Dr. Miguel Aschoff who is in agreement with the care plan as above.   Advised patient call the office or your primary care doctor for an appointment if no improvement within 72 hours or if any symptoms change or worsen at any time  Advised ER or urgent Care if after hours or on weekend. Call 911 for emergency symptoms at any time.Patinet verbalized understanding of all instructions given/reviewed and treatment plan and has no further questions or concerns at this time.    Patient verbalized understanding of all instructions given and denies any further questions at this time.

## 2017-11-15 NOTE — Patient Instructions (Signed)

## 2017-11-17 ENCOUNTER — Encounter: Payer: Self-pay | Admitting: Adult Health

## 2017-11-17 ENCOUNTER — Ambulatory Visit: Payer: BLUE CROSS/BLUE SHIELD | Admitting: Adult Health

## 2017-11-17 VITALS — BP 160/99 | HR 93 | Temp 98.0°F | Resp 18 | Wt 242.0 lb

## 2017-11-17 DIAGNOSIS — L03818 Cellulitis of other sites: Secondary | ICD-10-CM

## 2017-11-17 DIAGNOSIS — S20469A Insect bite (nonvenomous) of unspecified back wall of thorax, initial encounter: Secondary | ICD-10-CM

## 2017-11-17 DIAGNOSIS — W57XXXA Bitten or stung by nonvenomous insect and other nonvenomous arthropods, initial encounter: Secondary | ICD-10-CM

## 2017-11-17 DIAGNOSIS — L247 Irritant contact dermatitis due to plants, except food: Secondary | ICD-10-CM

## 2017-11-17 MED ORDER — PREDNISONE 10 MG (21) PO TBPK: ORAL_TABLET | ORAL | 0 refills | Status: DC

## 2017-11-17 MED ORDER — MUPIROCIN CALCIUM 2 % EX CREA: 1.0000 "application " | TOPICAL_CREAM | Freq: Two times a day (BID) | CUTANEOUS | 0 refills | Status: DC

## 2017-11-17 NOTE — Patient Instructions (Addendum)
Cellulitis, Adult Cellulitis is a skin infection. The infected area is usually red and sore. This condition occurs most often in the arms and lower legs. It is very important to get treated for this condition. Follow these instructions at home:  Take over-the-counter and prescription medicines only as told by your doctor.  If you were prescribed an antibiotic medicine, take it as told by your doctor. Do not stop taking the antibiotic even if you start to feel better.  Drink enough fluid to keep your pee (urine) clear or pale yellow.  Do not touch or rub the infected area.  Raise (elevate) the infected area above the level of your heart while you are sitting or lying down.  Place warm or cold wet cloths (warm or cold compresses) on the infected area. Do this as told by your doctor.  Keep all follow-up visits as told by your doctor. This is important. These visits let your doctor make sure your infection is not getting worse. Contact a doctor if:  You have a fever.  Your symptoms do not get better after 1-2 days of treatment.  Your bone or joint under the infected area starts to hurt after the skin has healed.  Your infection comes back. This can happen in the same area or another area.  You have a swollen bump in the infected area.  You have new symptoms.  You feel ill and also have muscle aches and pains. Get help right away if:  Your symptoms get worse.  You feel very sleepy.  You throw up (vomit) or have watery poop (diarrhea) for a long time.  There are red streaks coming from the infected area.  Your red area gets larger.  Your red area turns darker. This information is not intended to replace advice given to you by your health care provider. Make sure you discuss any questions you have with your health care provider. Document Released: 10/28/2007 Document Revised: 10/17/2015 Document Reviewed: 03/20/2015 Elsevier Interactive Patient Education  2018 [place], Adult An insect bite can make your skin red, itchy, and swollen. Some insects can spread disease to people with a bite. However, most insect bites do not lead to disease, and most are not serious. Follow these instructions at home: Bite area care  Do not scratch the bite area.  Keep the bite area clean and dry.  Wash the bite area every day with soap and water as told by your doctor.  Check the bite area every day for signs of infection. Check for: ? More redness, swelling, or pain. ? Fluid or blood. ? Warmth. ? Pus. Managing pain, itching, and swelling  You may put any of these on the bite area as told by your doctor: ? A baking soda paste. ? Cortisone cream. ? Calamine lotion.  If directed, put ice on the bite area. ? Put ice in a plastic bag. ? Place a towel between your skin and the bag. ? Leave the ice on for 20 minutes, 2-3 times a day. Medicines  Take medicines or put medicines on your skin only as told by your doctor.  If you were prescribed an antibiotic medicine, use it as told by your doctor. Do not stop using the antibiotic even if your condition improves. General instructions  Keep all follow-up visits as told by your doctor. This is important. How is this prevented? To help you have a lower risk of insect bites:  When you are outside, wear clothing that  covers your arms and legs.  Use insect repellent. The best insect repellents have: ? An active ingredient of DEET, picaridin, oil of lemon eucalyptus (OLE), or IR3535. ? Higher amounts of DEET or another active ingredient than other repellents have.  If your home windows do not have screens, think about putting some in.  Contact a doctor if:  You have more redness, swelling, or pain in the bite area.  You have fluid, blood, or pus coming from the bite area.  The bite area feels warm.  You have a fever. Get help right away if:  You have joint pain.  You have a rash.  You have  shortness of breath.  You feel more tired or sleepy than you normally do.  You have neck pain.  You have a headache.  You feel weaker than you normally do.  You have chest pain.  You have pain in your belly.  You feel sick to your stomach (nauseous) or you throw up (vomit). Summary  An insect bite can make your skin red, itchy, and swollen.  Do not scratch the bite area, and keep it clean and dry.  Ice can help with pain and itching from the bite. This information is not intended to replace advice given to you by your health care provider. Make sure you discuss any questions you have with your health care provider. Document Released: 05/08/2000 Document Revised: 12/12/2015 Document Reviewed: 09/26/2014 Elsevier Interactive Patient Education  2018 [place] Dermatitis Poison oak dermatitis is redness and soreness (inflammation) of the skin. It is caused by a chemical that is on the leaves of the poison oak plant. You may also have itching, a rash, and blisters. Symptoms often clear up in 1-2 weeks. You may get this condition by touching a poison oak plant. You can also get it by touching something that has the chemical on it. This may include animals or objects that have come in contact with the plant. Follow these instructions at home: General instructions  Take or apply over-the-counter and prescription medicines only as told by your doctor.  If you touch poison oak, wash your skin with soap and cold water right away.  Use hydrocortisone creams or calamine lotion as needed to help with itching.  Take oatmeal baths as needed. Use colloidal oatmeal. You can get this at a pharmacy or grocery store. Follow the instructions on the package.  Do not scratch or rub your skin.  While you have the rash, wash your clothes right after you wear them. Prevention   Know what poison oak looks like so you can avoid it. This plant has three leaves with flowering branches  on a single stem. The leaves are fuzzy. They have a toothlike edge.  If you have touched poison oak, wash with soap and water right away. Be sure to wash under your fingernails.  When hiking or camping, wear long pants, a long-sleeved shirt, tall socks, and hiking boots. You can also use a lotion on your skin that helps to prevent contact with the chemical on the plant.  If you think that your clothes or outdoor gear came in contact with poison oak, rinse them off with a garden hose before you bring them inside your house. Contact a doctor if:  You have open sores in the rash area.  You have more redness, swelling, or pain in the affected area.  You have redness that spreads beyond the rash area.  You have fluid, blood, or  pus coming from the affected area.  You have a fever.  You have a rash over a large area of your body.  You have a rash on your eyes, mouth, or genitals.  Your rash does not improve after a few days. Get help right away if:  Your face swells or your eyes swell shut.  You have trouble breathing.  You have trouble swallowing. This information is not intended to replace advice given to you by your health care provider. Make sure you discuss any questions you have with your health care provider. Document Released: 06/13/2010 Document Revised: 10/17/2015 Document Reviewed: 10/17/2014 Elsevier Interactive Patient Education  [employer].

## 2017-11-17 NOTE — Progress Notes (Signed)
Subjective:     Patient ID: Bridget Reed, female   DOB: 1960/09/02, 57 y.o.   MRN: [id number]  HPI    Patient is here for follow up for cellulitis right breast - she has had two Doxycycline days since switching antibiotic from Keflex. Denies any stomach upset. Educated on sun sensitivity previously and again at today's visit.   She did have fever and chills on Monday 11/15/17. She then reports she then felt better on Tuesday and symptoms were resolved.   She has been taking Sudafed for nasal/ ear congestion. She is advised  to discontinue with blood pressure.   She also has poison  oak on arms - she had been pulling weeds on Saturday and Sunday and noticed on Monday after noon she had a few itching places on bilateral arms that turned red with small blisters She also noticed an itchy area on her mid lower back.       Review of Systems  Constitutional: Negative for activity change, appetite change, chills, diaphoresis, fatigue, fever and unexpected weight change.       Chills and low grade fever reported by patient Monday 11/15/17 that resolved by am of 11/16/17. She decide not to go out of town 11/16/17.   HENT: Negative.        Ear pressure/ congestion  Taking sudafed   Eyes: Negative.   Respiratory: Negative.   Cardiovascular: Negative.   Endocrine: Negative.   Genitourinary: Negative.   Musculoskeletal: Negative.   Skin: Positive for color change and rash. Negative for pallor and wound.  Allergic/Immunologic: Negative.   Neurological: Negative.   Hematological: Negative.   Psychiatric/Behavioral: Negative.        Objective:   Physical Exam  Constitutional: She is oriented to person, place, and time. She appears well-developed and well-nourished. No distress.  HENT:  Head: Normocephalic and atraumatic.  Mouth/Throat: No oropharyngeal exudate.  Eyes: Pupils are equal, round, and reactive to light.  Neck: Normal range of motion. Neck supple.  Cardiovascular: Normal  rate, regular rhythm, normal heart sounds and intact distal pulses. Exam reveals no gallop and no friction rub.  No murmur heard. Pulmonary/Chest: Effort normal and breath sounds normal. No stridor. No respiratory distress. She has no wheezes. She has no rales. She exhibits no tenderness. Right breast exhibits skin change (No axilllary nodes on right side.). Right breast exhibits no inverted nipple, no mass, no nipple discharge and no tenderness. No breast swelling, tenderness, discharge or bleeding. Breasts are symmetrical.    Lymphadenopathy:       Right axillary: No pectoral and no lateral adenopathy present.  Neurological: She is alert and oriented to person, place, and time.  Skin: Skin is warm and dry. Capillary refill takes less than 2 seconds. Rash noted. Rash is maculopapular and vesicular. She is not diaphoretic. There is erythema. No pallor.     No axilllary nodes on right side. At 4 O'Clock on the right breast is4x 4cm circular area of erythema throughout sized area center. Indurated area has resolved since last visit. No drainage. No warmth or temperature change of skin. No change in size from previous visit.  Areas marked bilateral arms small scattered pruritic vesicular erythema consistent with contact dermatitis with exposure to pos ion oak.   Area marked on diagram, flat circular area with pink skin, no central clearing, with small red area in center. No skin temperature change or drainage. No induration.   Psychiatric: She has a normal mood and affect. Her behavior  is normal. Judgment and thought content normal.  Vitals reviewed.      Assessment:  Cellulitis of other specified site - Plan: CANCELED: CBC w/Diff  Irritant contact dermatitis due to plants, except food  Nonvenomous insect bite of back, unspecified laterality, initial encounter  Cellulitis of right breast is decreasing in size since starting Doxycycline. Induration is resolved since last visit. Advised  patient we will follow up until completley resolved.       Plan:       CBC recommended, patient declined today since she is feeling much better since Monday.   Will give prednisone E - Prescribed to pharmacy( discussed side effects) - may use if rash/ itching arms consistent with contact dermatitis worsens. May switch to Zyrtec daily per package instructions and discontinue Claritin and Sudafed.  May use Benadryl at night per package instructions for itching PRN.  Meds ordered this encounter  Medications  . predniSONE (STERAPRED UNI-PAK 21 TAB) 10 MG (21) TBPK tablet    Sig: PO: Take 6 tablets on day 1:Take 5 tablets day 2:Take 4 tablets day 3: Take 3 tablets day 4:Take 2 tablets day five: 5 Take 1 tablet day 6    Dispense:  21 tablet    Refill:  0  . mupirocin cream (BACTROBAN) 2 %    Sig: Apply 1 application topically 2 (two) times daily. To area of concern    Dispense:  15 g    Refill:  0   Return to the office for recheck on 11/22/17 Monday - will follow until completely resolved. She is starting on day three of Doxycycline today.   Advised patient call the office or your primary care doctor for an appointment if no improvement within 72 hours or if any symptoms change or worsen at any time  Advised ER or urgent Care if after hours or on weekend. Call 911 for emergency symptoms at any time.Patinet verbalized understanding of all instructions given/reviewed and treatment plan and has no further questions or concerns at this time.    Patient verbalized understanding of all instructions given and denies any further questions at this time.

## 2017-11-19 ENCOUNTER — Ambulatory Visit: Payer: BLUE CROSS/BLUE SHIELD | Admitting: Adult Health

## 2017-11-22 ENCOUNTER — Ambulatory Visit: Payer: BLUE CROSS/BLUE SHIELD | Admitting: Medical

## 2017-11-22 ENCOUNTER — Ambulatory Visit: Payer: BLUE CROSS/BLUE SHIELD

## 2017-11-22 VITALS — BP 133/56 | HR 91 | Temp 98.5°F | Resp 18 | Wt 240.6 lb

## 2017-11-22 DIAGNOSIS — L255 Unspecified contact dermatitis due to plants, except food: Secondary | ICD-10-CM

## 2017-11-22 DIAGNOSIS — N61 Mastitis without abscess: Secondary | ICD-10-CM

## 2017-11-22 NOTE — Progress Notes (Signed)
   Subjective:    Patient ID: Bridget Reed, female    DOB: 1961-02-05, 57 y.o.   MRN: [id number]  HPI 57 yo female comes in for recheck of right breast cellulitis, taking  Doxy daily. Area not hard anymore and not painful anymore. She will be out of town till next Thursday.   Review of Systems  Constitutional: Positive for fever (last Tuesday , lasted 2 days).  Cardiovascular: Negative for chest pain.  Skin: Positive for color change (erythema improving and retracting since starting Doxy) and rash (on forearms and on mid lumbar back).  Hematological: Negative for adenopathy.   Using calamine and alcohol on contact dermatitis on bilateral forearms and mid lumbar of back.    Objective:   Physical Exam  Constitutional: She is oriented to person, place, and time. She appears well-developed and well-nourished.  HENT:  Head: Normocephalic and atraumatic.  Eyes: Pupils are equal, round, and reactive to light. Conjunctivae and EOM are normal.  Pulmonary/Chest: Effort normal.  Neurological: She is alert and oriented to person, place, and time.  Skin: Skin is warm and dry. There is erythema (right breast about  4 O'clock redness is retracting).  Psychiatric: She has a normal mood and affect. Her behavior is normal. Judgment and thought content normal.  Nursing note and vitals reviewed.   Erythema shrinking in size on right breast compared to when I initially saw the right breast.      Assessment & Plan:  Cellulitis of the right breast improving, Contact dermatitis bilateral forearms and mid lumbar of back.. To stop the alcohol treatment on contact dermatitis explained to patient it can cause irritation to the skin. Use OTC benadryl skin cream on skin rash. Apply to forearms and back as directed. Continue Claritin during the day and Benadryl at night for itching. Return to clinic next Thursday for recheck of cellulitis and contact dermatitis . Reviewed with patient if erythema on  right breast appears to be returning to seek out medical care while she is out of town.  Patient verbalizes understanding and has no questions at discharge.

## 2017-11-23 ENCOUNTER — Telehealth: Payer: Self-pay | Admitting: Medical

## 2017-11-23 ENCOUNTER — Ambulatory Visit: Payer: BLUE CROSS/BLUE SHIELD

## 2017-11-23 NOTE — Telephone Encounter (Signed)
Patient on Doxycycline. Called patient to make sure she knows that medication will make skin sun sensitive. She is aware. She also states she will be done with the antibiotic prior to her going on vacation.  Encouraged sunscreen use due to previous history of skin cancer. Patient verbalizes understanding and has no questions at discharge.

## 2017-12-02 ENCOUNTER — Encounter: Payer: Self-pay | Admitting: Adult Health

## 2017-12-02 ENCOUNTER — Ambulatory Visit: Payer: BLUE CROSS/BLUE SHIELD | Admitting: Adult Health

## 2017-12-02 VITALS — BP 162/79 | HR 88 | Temp 97.8°F | Resp 16 | Ht 62.0 in | Wt 240.0 lb

## 2017-12-02 DIAGNOSIS — N61 Mastitis without abscess: Secondary | ICD-10-CM

## 2017-12-02 MED ORDER — DOXYCYCLINE HYCLATE 100 MG PO TABS: 100.0000 mg | ORAL_TABLET | Freq: Two times a day (BID) | ORAL | 0 refills | Status: DC

## 2017-12-02 NOTE — Addendum Note (Signed)
Addended by: Doreen Beam on: 12/02/2017 04:13 PM   Modules accepted: Orders

## 2017-12-02 NOTE — Patient Instructions (Signed)
Breast Ultrasound Breast ultrasound, or breast ultrasonogram, is a procedure that is used to check for breast problems. A breast ultrasound uses harmless sound waves and a computer to create pictures of the inside of your breast. This procedure can help determine whether a lump in your breast is a fluid-filled sac (cyst), a solid tumor, or a growth. It can also help determine whether a tumor is cancerous (malignant) or noncancerous (benign). Sometimes a breast ultrasound is done to locate nodules in the breast that are too small to feel but need to be removed during surgery. A breast ultrasound takes about 30 minutes. Tell a health care provider about:  Any allergies you have.  All medicines you are taking, including vitamins, herbs, eye drops, creams, and over-the-counter medicines.  Any blood disorders you have.  Any surgeries you have had.  Any medical conditions you have. What are the risks? Breast ultrasound is safe and painless. Ultrasound imaging does not use X-rays. There are no known risks for this procedure. What happens before the procedure? You do not need to prepare for a breast ultrasound. You will undress from your waist up, so wear loose, comfortable clothing on the day of the procedure. What happens during the procedure?  You will lie down on an examining table.  A health care provider will apply gel to your breast area.  You may be asked to hold your arm above your head.  The health care provider will move a handheld probe, which looks like a microphone, back and forth over your breast.  The probe will send signals to a computer that will create images of your breast.  When the procedure is over, the gel will be cleaned from your breast, and you can get dressed. What happens after the procedure?  You can go home right away and do all of your usual activities.  It is up to you to get the results of your procedure. Ask your health care provider, or the department  that is doing the procedure, when your results will be ready. Summary  A breast ultrasound is a procedure that is used to check for breast problems.  This procedure uses harmless sound waves and a computer to create pictures of the inside of your breast.  This is a safe procedure. This information is not intended to replace advice given to you by your health care provider. Make sure you discuss any questions you have with your health care provider. Document Released: 06/02/2004 Document Revised: 01/16/2016 Document Reviewed: 01/13/2016 Elsevier Interactive Patient Education  2017 [place] A mammogram is an X-ray of the breasts that is done to check for changes that are not normal. This test can screen for and find any changes that may suggest breast cancer. This test can also help to find other changes and variations in the breast. What happens before the procedure?  Have this test done about 1-2 weeks after your period. This is usually when your breasts are the least tender.  If you are visiting a new doctor or clinic, send any past mammogram images to your new doctor's office.  Wash your breasts and under your arms the day of the test.  Do not use deodorants, perfumes, lotions, or powders on the day of the test.  Take off any jewelry from your neck.  Wear clothes that you can change into and out of easily. What happens during the procedure?  You will undress from the waist up. You will put on a gown.  You will stand in front of the X-ray machine.  Each breast will be placed between two plastic or glass plates. The plates will press down on your breast for a few seconds. Try to stay as relaxed as possible. This does not cause any harm to your breasts. Any discomfort you feel will be very brief.  X-rays will be taken from different angles of each breast. The procedure may vary among doctors and hospitals. What happens after the procedure?  The mammogram will be  looked at by a specialist (radiologist).  You may need to do certain parts of the test again. This depends on the quality of the images.  Ask when your test results will be ready. Make sure you get your test results.  You may go back to your normal activities. This information is not intended to replace advice given to you by your health care provider. Make sure you discuss any questions you have with your health care provider. Document Released: 08/07/2008 Document Revised: 10/17/2015 Document Reviewed: 07/20/2014 Elsevier Interactive Patient Education  2018 [place]. Cellulitis, Adult Cellulitis is a skin infection. The infected area is usually red and sore. This condition occurs most often in the arms and lower legs. It is very important to get treated for this condition. Follow these instructions at home:  Take over-the-counter and prescription medicines only as told by your doctor.  If you were prescribed an antibiotic medicine, take it as told by your doctor. Do not stop taking the antibiotic even if you start to feel better.  Drink enough fluid to keep your pee (urine) clear or pale yellow.  Do not touch or rub the infected area.  Raise (elevate) the infected area above the level of your heart while you are sitting or lying down.  Place warm or cold wet cloths (warm or cold compresses) on the infected area. Do this as told by your doctor.  Keep all follow-up visits as told by your doctor. This is important. These visits let your doctor make sure your infection is not getting worse. Contact a doctor if:  You have a fever.  Your symptoms do not get better after 1-2 days of treatment.  Your bone or joint under the infected area starts to hurt after the skin has healed.  Your infection comes back. This can happen in the same area or another area.  You have a swollen bump in the infected area.  You have new symptoms.  You feel ill and also have muscle aches and  pains. Get help right away if:  Your symptoms get worse.  You feel very sleepy.  You throw up (vomit) or have watery poop (diarrhea) for a long time.  There are red streaks coming from the infected area.  Your red area gets larger.  Your red area turns darker. This information is not intended to replace advice given to you by your health care provider. Make sure you discuss any questions you have with your health care provider. Document Released: 10/28/2007 Document Revised: 10/17/2015 Document Reviewed: 03/20/2015 Elsevier Interactive Patient Education  2018 [employer].

## 2017-12-02 NOTE — Progress Notes (Addendum)
Patient ID: Bridget Reed, female   DOB: 1960/08/22, 57 y.o.   MRN: [id number]  Order for diagnostic Mammo entered per [facility]- order code [id number] Orders Placed This Encounter  Procedures  . US BREAST LTD UNI RIGHT INC AXILLA    Order Specific Question:   Reason for Exam (SYMPTOM  OR DIAGNOSIS REQUIRED)    Answer:   treating suspected localized skin infection right breast 4 oclock with Doxycycline- not completly resolving. Rule out any other etiology. she also has orders in for her mammogram - would like to have at same time if possible - she is over due last 08/2016    Order Specific Question:   Preferred imaging location?    Answer:   [facility]    Order Specific Question:   Call Results- Best Contact Number?    Answer:   [phone]- ok to do diagnostic mammogram if radiologist feels warranted  . US BREAST LTD UNI LEFT INC AXILLA    Standing Status:   Future    Standing Expiration Date:   01/02/2018    Order Specific Question:   Reason for Exam (SYMPTOM  OR DIAGNOSIS REQUIRED)    Answer:   right breast cellulitus - see right breast order    Order Specific Question:   Preferred imaging location?    Answer:   [facility]    Order Specific Question:   Call Results- Best Contact Number?    Answer:   [phone]  . MM DIAG BREAST TOMO BILATERAL    Standing Status:   Future    Standing Expiration Date:   01/03/2018    Scheduling Instructions:     PLEASE CALL [phone] if any questions with this order    Order Specific Question:   Reason for Exam (SYMPTOM  OR DIAGNOSIS REQUIRED)    Answer:   breat cellulitus right breast resolvoing slowly/ rule out any other etiology. On doxycycline    Order Specific Question:   Is the patient pregnant?    Answer:   No    Order Specific Question:   Preferred imaging location?    Answer:   [facility]   [facility] [place] [facility] [facility] called and requests diagnostic mammogram be placed with diagnosis as  well as bilateral ultrasound orders if needed.  Nelda Severe RN had spoke with [facility].

## 2017-12-02 NOTE — Progress Notes (Addendum)
Subjective:     Patient ID: Bridget Reed, female   DOB: July 15, 1960, 57 y.o.   MRN: [id number]  HPI   Blood pressure (!) 162/79, pulse 88, temperature 97.8 F (36.6 C), resp. rate 16, height 5\' 2"  (1.575 m), weight 240 lb (108.9 kg), last menstrual period 11/22/2013, SpO2 99 %.   Patient is a 57 year old female in no acute distress who comes to the clinic for recheck on right breast cellulitis. She has finished ten days of Doxycycline. She reports area is  non tender,  redness has resolved she reports. She denies any pain.   She reports poison oak and skin area seen at last visit with this provider is resolved now.   Patient  denies any fever, body aches,chills, rash, chest pain, shortness of breath, nausea, vomiting, or diarrhea.    Review of Systems  HENT: Negative.   Respiratory: Negative.   Cardiovascular: Negative.   Gastrointestinal: Negative.   Genitourinary: Negative.   Musculoskeletal: Negative.   Skin: Positive for color change. Negative for pallor and wound.       Right breast  Neurological: Negative.   Hematological: Negative.   Psychiatric/Behavioral: Negative.        Objective:   Physical Exam  Constitutional: She is oriented to person, place, and time. She appears well-developed and well-nourished. No distress.  HENT:  Head: Normocephalic and atraumatic.  Mouth/Throat: No oropharyngeal exudate.  Eyes: Pupils are equal, round, and reactive to light.  Neck: Normal range of motion. Neck supple.  Cardiovascular: Normal rate, regular rhythm, normal heart sounds and intact distal pulses. Exam reveals no gallop and no friction rub.  No murmur heard. Pulmonary/Chest: Effort normal and breath sounds normal. No stridor. No respiratory distress. She has no wheezes. She has no rales. She exhibits no tenderness. Right breast exhibits skin change (No axilllary nodes on right side.). Right breast exhibits no inverted nipple, no mass, no nipple discharge and no tenderness.  No breast swelling, tenderness, discharge or bleeding. Breasts are symmetrical.    Genitourinary: No breast swelling, tenderness, discharge or bleeding.  Lymphadenopathy:       Right axillary: No pectoral and no lateral adenopathy present.  Neurological: She is alert and oriented to person, place, and time.  Skin: Skin is warm, dry and intact. Capillary refill takes less than 2 seconds. No rash noted. Rash is not maculopapular. She is not diaphoretic. There is erythema. No pallor.  No axilllary nodes on right side. No skin dimpling. No nipple discharge or abnormality. No abnormal findings on palpation of right breast.   At 4 O'Clock on the right breast 3 x 3 cm circular area of pink skin  throughout sized area center.No induration. No drainage. No warmth or temperature change of skin. No change in size from previous visit.  Poison oak on bilateral arms and back area seen at last office visit has resolved.    Psychiatric: She has a normal mood and affect. Her behavior is normal. Judgment and thought content normal.  Vitals reviewed.      Assessment:    Cellulitis of right breast - Plan: US BREAST LTD UNI RIGHT INC AXILLA       Plan:     Meds ordered this encounter  Medications  . doxycycline (VIBRA-TABS) 100 MG tablet    Sig: Take 1 tablet (100 mg total) by mouth 2 (two) times daily.    Dispense:  10 tablet    Refill:  0   She will proceed with  her mammogram bilaterally that is already ordered by her McLean-Scocuzza, Nino Glow, MD  Right breast ultrasound ordered. She will call if no call to schedule within 2 days.    Follow up recheck in one week .   Orders Placed This Encounter  Procedures  . US BREAST LTD UNI RIGHT INC AXILLA    Order Specific Question:   Reason for Exam (SYMPTOM  OR DIAGNOSIS REQUIRED)    Answer:   treating suspected localized skin infection right breast 4 oclock with Doxycycline- not completly resolving. Rule out any other etiology. she also has orders  in for her mammogram - would like to have at same time if possible - she is over due last 08/2016    Order Specific Question:   Preferred imaging location?    Answer:   [facility]    Order Specific Question:   Call Results- Best Contact Number?    Answer:   [phone]- ok to do diagnostic mammogram if radiologist feels warranted    Provider thoroughly discussed in collaboration above plan with supervising physician Dr. Miguel Aschoff who is in agreement with the care plan as above.   Advised patient call the office or your primary care doctor for an appointment if no improvement within 72 hours or if any symptoms change or worsen at any time  Advised ER or urgent Care if after hours or on weekend. Call 911 for emergency symptoms at any time.Patinet verbalized understanding of all instructions given/reviewed and treatment plan and has no further questions or concerns at this time.

## 2017-12-03 ENCOUNTER — Telehealth: Payer: Self-pay

## 2017-12-03 NOTE — Addendum Note (Signed)
Addended by: Doreen Beam on: 12/03/2017 09:09 AM   Modules accepted: Orders

## 2017-12-03 NOTE — Telephone Encounter (Signed)
Bilateral Breast U/S and Diagnostic Mammogram ordered with [facility]  Appointment scheduled for 12/06/2017 @ 10:40am. Pt notified of appointment and instructed to bring insurance card and photo ID on arrival.

## 2017-12-06 ENCOUNTER — Ambulatory Visit
Admission: RE | Admit: 2017-12-06 | Discharge: 2017-12-06 | Disposition: A | Discharge status: Home / Self Care | Payer: BLUE CROSS/BLUE SHIELD | Source: Ambulatory Visit | Attending: Adult Health | Admitting: Adult Health

## 2017-12-06 DIAGNOSIS — N61 Mastitis without abscess: Secondary | ICD-10-CM | POA: Insufficient documentation

## 2017-12-06 DIAGNOSIS — N6001 Solitary cyst of right breast: Secondary | ICD-10-CM | POA: Diagnosis not present

## 2017-12-06 DIAGNOSIS — R922 Inconclusive mammogram: Secondary | ICD-10-CM | POA: Diagnosis not present

## 2017-12-15 DIAGNOSIS — Z85828 Personal history of other malignant neoplasm of skin: Secondary | ICD-10-CM | POA: Diagnosis not present

## 2017-12-15 DIAGNOSIS — L910 Hypertrophic scar: Secondary | ICD-10-CM | POA: Diagnosis not present

## 2017-12-15 DIAGNOSIS — D485 Neoplasm of uncertain behavior of skin: Secondary | ICD-10-CM | POA: Diagnosis not present

## 2017-12-15 DIAGNOSIS — Z1283 Encounter for screening for malignant neoplasm of skin: Secondary | ICD-10-CM | POA: Diagnosis not present

## 2017-12-15 DIAGNOSIS — D225 Melanocytic nevi of trunk: Secondary | ICD-10-CM | POA: Diagnosis not present

## 2018-01-17 ENCOUNTER — Encounter: Payer: Self-pay | Admitting: Medical

## 2018-01-17 ENCOUNTER — Ambulatory Visit: Payer: BLUE CROSS/BLUE SHIELD | Admitting: Medical

## 2018-01-17 ENCOUNTER — Encounter: Payer: Self-pay | Admitting: General Surgery

## 2018-01-17 VITALS — BP 127/74 | HR 76 | Temp 98.6°F | Resp 16 | Wt 239.6 lb

## 2018-01-17 DIAGNOSIS — L089 Local infection of the skin and subcutaneous tissue, unspecified: Secondary | ICD-10-CM

## 2018-01-17 DIAGNOSIS — L723 Sebaceous cyst: Principal | ICD-10-CM

## 2018-01-17 MED ORDER — DOXYCYCLINE HYCLATE 100 MG PO TABS: 100.0000 mg | ORAL_TABLET | Freq: Two times a day (BID) | ORAL | 0 refills | Status: DC

## 2018-01-17 NOTE — Progress Notes (Signed)
   Subjective:    Patient ID: Bridget Reed, female    DOB: 12-23-1960, 57 y.o.   MRN: [id number]  HPI 57 yo female in non acute distress. Noticed Saturday  Right breast tenderness with leaning against something then noticed redness on right breast with drainage ,slightly yellow and blood.Denies fever or chills. Treated 11/01/2017 originally with Augmentin then changed to Doxy. Recent mammogram and ultrasound showed it was an infected sebacious cyst 1.8cm in length.  Review of Systems  Constitutional: Negative for activity change, appetite change, chills, fatigue and fever.  Cardiovascular: Positive for chest pain (right breast 3-4/10).  Skin: Positive for color change (right bresast) and wound.  Hematological: Negative for adenopathy.  Psychiatric/Behavioral: Negative for behavioral problems, self-injury and suicidal ideas.       Objective:   Physical Exam  Constitutional: She is oriented to person, place, and time. She appears well-developed and well-nourished.  HENT:  Head: Normocephalic and atraumatic.  Eyes: Pupils are equal, round, and reactive to light. Conjunctivae and EOM are normal.  Pulmonary/Chest: Effort normal. She exhibits tenderness (right breast with palpation).  Neurological: She is alert and oriented to person, place, and time.  Skin: There is erythema (right breast with little drainae.).  Psychiatric: She has a normal mood and affect. Her behavior is normal. Judgment and thought content normal.  Nursing note and vitals reviewed.    Right breast lower quadrant about  4-5 O'clock is erythema with a circular center area that is dark.  Draining slightly bloody and clear yellow. Placed gauze on area, approximately measures approximately  5 cm across. No axillary adenopathy.    Assessment & Plan:  Right breast sebaceous cyst infection Meds ordered this encounter  Medications  . doxycycline (VIBRA-TABS) 100 MG tablet    Sig: Take 1 tablet (100 mg total) by mouth  2 (two) times daily.    Dispense:  20 tablet    Refill:  0   Continue warm compresses. Reviewed with Dr. Rosanna Randy, agrees on plan to refer to surgery. Referral completed for [facility]. Return to clinic if worsening or fever. Area marked for patient to watch. Patient verbalizes understanding and has no questions at discharge. Offered Motrin or Tylenol but she declined.

## 2018-02-10 ENCOUNTER — Ambulatory Visit: Payer: BLUE CROSS/BLUE SHIELD | Admitting: General Surgery

## 2018-05-26 ENCOUNTER — Encounter: Payer: Self-pay | Admitting: Adult Health

## 2018-05-26 ENCOUNTER — Ambulatory Visit: Payer: BLUE CROSS/BLUE SHIELD | Admitting: Adult Health

## 2018-05-26 VITALS — BP 151/69 | HR 74 | Temp 98.4°F | Resp 16 | Ht 62.0 in | Wt 243.0 lb

## 2018-05-26 DIAGNOSIS — J0110 Acute frontal sinusitis, unspecified: Secondary | ICD-10-CM

## 2018-05-26 MED ORDER — FLUCONAZOLE 150 MG PO TABS: 150.0000 mg | ORAL_TABLET | Freq: Once | ORAL | 0 refills | Status: AC

## 2018-05-26 MED ORDER — AMOXICILLIN-POT CLAVULANATE 875-125 MG PO TABS: 1.0000 | ORAL_TABLET | Freq: Two times a day (BID) | ORAL | 0 refills | Status: DC

## 2018-05-26 NOTE — Patient Instructions (Addendum)
Amoxicillin; Clavulanic Acid tablets What is this medicine? AMOXICILLIN; CLAVULANIC ACID (a mox i SIL in; KLAV yoo lan ic AS id) is a penicillin antibiotic. It is used to treat certain kinds of bacterial infections. It will not work for colds, flu, or other viral infections. This medicine may be used for other purposes; ask your health care provider or pharmacist if you have questions. COMMON BRAND NAME(S): Augmentin What should I tell my health care provider before I take this medicine? They need to know if you have any of these conditions: -bowel disease, like colitis -kidney disease -liver disease -mononucleosis -an unusual or allergic reaction to amoxicillin, penicillin, cephalosporin, other antibiotics, clavulanic acid, other medicines, foods, dyes, or preservatives -pregnant or trying to get pregnant -breast-feeding How should I use this medicine? Take this medicine by mouth with a full glass of water. Follow the directions on the prescription label. Take at the start of a meal. Do not crush or chew. If the tablet has a score line, you may cut it in half at the score line for easier swallowing. Take your medicine at regular intervals. Do not take your medicine more often than directed. Take all of your medicine as directed even if you think you are better. Do not skip doses or stop your medicine early. Talk to your pediatrician regarding the use of this medicine in children. Special care may be needed. Overdosage: If you think you have taken too much of this medicine contact a poison control center or emergency room at once. NOTE: This medicine is only for you. Do not share this medicine with others. What if I miss a dose? If you miss a dose, take it as soon as you can. If it is almost time for your next dose, take only that dose. Do not take double or extra doses. What may interact with this medicine? -allopurinol -anticoagulants -birth control pills -methotrexate -probenecid This  list may not describe all possible interactions. Give your health care provider a list of all the medicines, herbs, non-prescription drugs, or dietary supplements you use. Also tell them if you smoke, drink alcohol, or use illegal drugs. Some items may interact with your medicine. What should I watch for while using this medicine? Tell your doctor or health care professional if your symptoms do not improve. Do not treat diarrhea with over the counter products. Contact your doctor if you have diarrhea that lasts more than 2 days or if it is severe and watery. If you have diabetes, you may get a false-positive result for sugar in your urine. Check with your doctor or health care professional. Birth control pills may not work properly while you are taking this medicine. Talk to your doctor about using an extra method of birth control. What side effects may I notice from receiving this medicine? Side effects that you should report to your doctor or health care professional as soon as possible: -allergic reactions like skin rash, itching or hives, swelling of the face, lips, or tongue -breathing problems -dark urine -fever or chills, sore throat -redness, blistering, peeling or loosening of the skin, including inside the mouth -seizures -trouble passing urine or change in the amount of urine -unusual bleeding, bruising -unusually weak or tired -white patches or sores in the mouth or throat Side effects that usually do not require medical attention (report to your doctor or health care professional if they continue or are bothersome): -diarrhea -dizziness -headache -nausea, vomiting -stomach upset -vaginal or anal irritation This list may   not describe all possible side effects. Call your doctor for medical advice about side effects. You may report side effects to FDA at 1-800-FDA-1088. Where should I keep my medicine? Keep out of the reach of children. Store at room temperature below 25 degrees  C (77 degrees F). Keep container tightly closed. Throw away any unused medicine after the expiration date. NOTE: This sheet is a summary. It may not cover all possible information. If you have questions about this medicine, talk to your doctor, pharmacist, or health care provider.  2019 Elsevier/Gold Standard (2007-08-04 12:04:30) Sinusitis, Adult Sinusitis is inflammation of your sinuses. Sinuses are hollow spaces in the bones around your face. Your sinuses are located:  Around your eyes.  In the middle of your forehead.  Behind your nose.  In your cheekbones. Mucus normally drains out of your sinuses. When your nasal tissues become inflamed or swollen, mucus can become trapped or blocked. This allows bacteria, viruses, and fungi to grow, which leads to infection. Most infections of the sinuses are caused by a virus. Sinusitis can develop quickly. It can last for up to 4 weeks (acute) or for more than 12 weeks (chronic). Sinusitis often develops after a cold. What are the causes? This condition is caused by anything that creates swelling in the sinuses or stops mucus from draining. This includes:  Allergies.  Asthma.  Infection from bacteria or viruses.  Deformities or blockages in your nose or sinuses.  Abnormal growths in the nose (nasal polyps).  Pollutants, such as chemicals or irritants in the air.  Infection from fungi (rare). What increases the risk? You are more likely to develop this condition if you:  Have a weak body defense system (immune system).  Do a lot of swimming or diving.  Overuse nasal sprays.  Smoke. What are the signs or symptoms? The main symptoms of this condition are pain and a feeling of pressure around the affected sinuses. Other symptoms include:  Stuffy nose or congestion.  Thick drainage from your nose.  Swelling and warmth over the affected sinuses.  Headache.  Upper toothache.  A cough that may get worse at night.  Extra mucus  that collects in the throat or the back of the nose (postnasal drip).  Decreased sense of smell and taste.  Fatigue.  A fever.  Sore throat.  Bad breath. How is this diagnosed? This condition is diagnosed based on:  Your symptoms.  Your medical history.  A physical exam.  Tests to find out if your condition is acute or chronic. This may include: ? Checking your nose for nasal polyps. ? Viewing your sinuses using a device that has a light (endoscope). ? Testing for allergies or bacteria. ? Imaging tests, such as an MRI or CT scan. In rare cases, a bone biopsy may be done to rule out more serious types of fungal sinus disease. How is this treated? Treatment for sinusitis depends on the cause and whether your condition is chronic or acute.  If caused by a virus, your symptoms should go away on their own within 10 days. You may be given medicines to relieve symptoms. They include: ? Medicines that shrink swollen nasal passages (topical intranasal decongestants). ? Medicines that treat allergies (antihistamines). ? A spray that eases inflammation of the nostrils (topical intranasal corticosteroids). ? Rinses that help get rid of thick mucus in your nose (nasal saline washes).  If caused by bacteria, your health care provider may recommend waiting to see if your symptoms improve.  Most bacterial infections will get better without antibiotic medicine. You may be given antibiotics if you have: ? A severe infection. ? A weak immune system.  If caused by narrow nasal passages or nasal polyps, you may need to have surgery. Follow these instructions at home: Medicines  Take, use, or apply over-the-counter and prescription medicines only as told by your health care provider. These may include nasal sprays.  If you were prescribed an antibiotic medicine, take it as told by your health care provider. Do not stop taking the antibiotic even if you start to feel better. Hydrate and  humidify   Drink enough fluid to keep your urine pale yellow. Staying hydrated will help to thin your mucus.  Use a cool mist humidifier to keep the humidity level in your home above 50%.  Inhale steam for 10-15 minutes, 3-4 times a day, or as told by your health care provider. You can do this in the bathroom while a hot shower is running.  Limit your exposure to cool or dry air. Rest  Rest as much as possible.  Sleep with your head raised (elevated).  Make sure you get enough sleep each night. General instructions   Apply a warm, moist washcloth to your face 3-4 times a day or as told by your health care provider. This will help with discomfort.  Wash your hands often with soap and water to reduce your exposure to germs. If soap and water are not available, use hand sanitizer.  Do not smoke. Avoid being around people who are smoking (secondhand smoke).  Keep all follow-up visits as told by your health care provider. This is important. Contact a health care provider if:  You have a fever.  Your symptoms get worse.  Your symptoms do not improve within 10 days. Get help right away if:  You have a severe headache.  You have persistent vomiting.  You have severe pain or swelling around your face or eyes.  You have vision problems.  You develop confusion.  Your neck is stiff.  You have trouble breathing. Summary  Sinusitis is soreness and inflammation of your sinuses. Sinuses are hollow spaces in the bones around your face.  This condition is caused by nasal tissues that become inflamed or swollen. The swelling traps or blocks the flow of mucus. This allows bacteria, viruses, and fungi to grow, which leads to infection.  If you were prescribed an antibiotic medicine, take it as told by your health care provider. Do not stop taking the antibiotic even if you start to feel better.  Keep all follow-up visits as told by your health care provider. This is  important. This information is not intended to replace advice given to you by your health care provider. Make sure you discuss any questions you have with your health care provider. Document Released: 05/11/2005 Document Revised: 10/11/2017 Document Reviewed: 10/11/2017 Elsevier Interactive Patient Education  2019 [employer].

## 2018-05-26 NOTE — Progress Notes (Addendum)
Subjective:     Patient ID: Bridget Reed, female   DOB: [birth year], 58 y.o.   MRN: [id number]  Blood pressure (!) 151/69, pulse 74, temperature 98.4 F (36.9 C), resp. rate 16, height 5\' 2"  (1.575 m), weight 243 lb (110.2 kg), last menstrual period 11/22/2013, SpO2 100 %.   Patient is a 59 year old female in no acute distress who comes to the office with complaints of sinusitis symptoms as below  Sinusitis  This is a new problem. The current episode started 1 to 4 weeks ago (1.5 weeks ). The problem has been gradually worsening since onset. There has been no fever. Associated symptoms include congestion, headaches, sinus pressure and swollen glands. Pertinent negatives include no chills, coughing, diaphoresis, ear pain, hoarse voice, neck pain, shortness of breath, sneezing or sore throat. Treatments tried: took three days of Amoxicillin she had at home improved but since out she has felt worse,     Allergies  Allergen Reactions  . Mobic [Meloxicam] Swelling    In hands and ankles and under eyes.  . Erythromycin     Rash, GI upset  . Erythromycin Base   . Influenza Vac Split Quad     Possible allergy- rash after injection  . Shrimp [Shellfish Allergy] Other (See Comments)    Headache when eaten in excess  . Sulfa Antibiotics     unknown  . Azithromycin Rash      Review of Systems  Constitutional: Positive for fatigue. Negative for activity change, appetite change, chills, diaphoresis, fever and unexpected weight change.  HENT: Positive for congestion, postnasal drip, rhinorrhea and sinus pressure. Negative for dental problem, drooling, ear discharge, ear pain, facial swelling, hearing loss, hoarse voice, mouth sores, nosebleeds, sinus pain, sneezing, sore throat, tinnitus, trouble swallowing and voice change.   Eyes: Negative.   Respiratory: Negative.  Negative for apnea, cough, choking, chest tightness, shortness of breath, wheezing and stridor.   Cardiovascular: Negative.   Negative for chest pain, palpitations and leg swelling.  Gastrointestinal: Negative.   Genitourinary: Negative.   Musculoskeletal: Negative.  Negative for neck pain.  Skin: Negative.   Neurological: Positive for headaches. Negative for dizziness, tremors, seizures, syncope, facial asymmetry, speech difficulty, weakness, light-headedness and numbness.  Hematological: Negative.   Psychiatric/Behavioral: Negative.        Objective:   Physical Exam Vitals signs reviewed.  Constitutional:      General: She is not in acute distress.    Appearance: Normal appearance. She is well-developed and well-groomed. She is not ill-appearing, toxic-appearing or diaphoretic.  HENT:     Head: Normocephalic and atraumatic.     Jaw: There is normal jaw occlusion.     Salivary Glands: Right salivary gland is not diffusely enlarged or tender. Left salivary gland is not diffusely enlarged or tender.     Right Ear: Hearing, ear canal and external ear normal. No tenderness. A middle ear effusion is present. There is no impacted cerumen. Tympanic membrane is erythematous. Tympanic membrane is not perforated.     Left Ear: Hearing, ear canal and external ear normal. No tenderness. A middle ear effusion is present. There is no impacted cerumen. Tympanic membrane is erythematous. Tympanic membrane is not perforated.     Nose: Mucosal edema, congestion and rhinorrhea present. Rhinorrhea is purulent.     Right Sinus: Maxillary sinus tenderness and frontal sinus tenderness present.     Left Sinus: Maxillary sinus tenderness and frontal sinus tenderness present.     Mouth/Throat:  Lips: Pink.     Pharynx: Oropharynx is clear. Uvula midline.     Comments: Generalized teeth pain  Lymphadenopathy:     Head:     Right side of head: No submental, submandibular, tonsillar, preauricular, posterior auricular or occipital adenopathy.     Left side of head: No submental, submandibular, tonsillar, preauricular, posterior  auricular or occipital adenopathy.     Cervical: Cervical adenopathy present.     Right cervical: Superficial cervical adenopathy (mild bilaterally ) present. No deep or posterior cervical adenopathy.    Left cervical: Superficial cervical adenopathy present. No deep or posterior cervical adenopathy.  Skin:    General: Skin is warm.     Capillary Refill: Capillary refill takes less than 2 seconds.     Nails: There is no clubbing.   Neurological:     Mental Status: She is alert.     GCS: GCS eye subscore is 4. GCS verbal subscore is 5. GCS motor subscore is 6.     Motor: Motor function is intact.     Coordination: Coordination is intact.  Psychiatric:        Attention and Perception: Attention normal.        Mood and Affect: Mood normal.        Speech: Speech normal.        Behavior: Behavior normal. Behavior is cooperative.        Thought Content: Thought content normal.        Cognition and Memory: Cognition normal.        Judgment: Judgment normal.        Assessment:     Acute non-recurrent frontal sinusitis      Plan:     Meds ordered this encounter  Medications  . amoxicillin-clavulanate (AUGMENTIN) 875-125 MG tablet    Sig: Take 1 tablet by mouth 2 (two) times daily.    Dispense:  20 tablet    Refill:  0  . fluconazole (DIFLUCAN) 150 MG tablet    Sig: Take 1 tablet (150 mg total) by mouth once for 1 dose. If needed for yeast symptoms    Dispense:  1 tablet    Refill:  0   Patient requested Diflucan if needed for yeast if develops- she has taken before without difficulty denies any liver disease. Patient declined After Visit Summary- reviewed with patient.    Advised patient call the office or your primary care doctor for an appointment if no improvement within 72 hours or if any symptoms change or worsen at any time  Advised ER or urgent Care if after hours or on weekend. Call 911 for emergency symptoms at any time.Patinet verbalized understanding of all  instructions given/reviewed and treatment plan and has no further questions or concerns at this time.    Patient verbalized understanding of all instructions given and denies any further questions at this time.

## 2018-06-01 DIAGNOSIS — M9901 Segmental and somatic dysfunction of cervical region: Secondary | ICD-10-CM | POA: Diagnosis not present

## 2018-06-01 DIAGNOSIS — M436 Torticollis: Secondary | ICD-10-CM | POA: Diagnosis not present

## 2018-06-01 DIAGNOSIS — M530 Cervicocranial syndrome: Secondary | ICD-10-CM | POA: Diagnosis not present

## 2018-06-07 DIAGNOSIS — M530 Cervicocranial syndrome: Secondary | ICD-10-CM | POA: Diagnosis not present

## 2018-06-07 DIAGNOSIS — M9901 Segmental and somatic dysfunction of cervical region: Secondary | ICD-10-CM | POA: Diagnosis not present

## 2018-06-07 DIAGNOSIS — G44219 Episodic tension-type headache, not intractable: Secondary | ICD-10-CM | POA: Diagnosis not present

## 2018-06-30 ENCOUNTER — Other Ambulatory Visit: Payer: Self-pay | Admitting: Internal Medicine

## 2018-06-30 DIAGNOSIS — I10 Essential (primary) hypertension: Secondary | ICD-10-CM

## 2018-06-30 MED ORDER — LISINOPRIL-HYDROCHLOROTHIAZIDE 20-12.5 MG PO TABS: 2.0000 | ORAL_TABLET | Freq: Every day | ORAL | 0 refills | Status: DC

## 2018-07-14 ENCOUNTER — Ambulatory Visit (INDEPENDENT_AMBULATORY_CARE_PROVIDER_SITE_OTHER): Payer: BLUE CROSS/BLUE SHIELD | Admitting: Internal Medicine

## 2018-07-14 ENCOUNTER — Encounter: Payer: Self-pay | Admitting: Internal Medicine

## 2018-07-14 VITALS — BP 128/66 | HR 85 | Temp 98.7°F | Ht 61.75 in | Wt 243.4 lb

## 2018-07-14 DIAGNOSIS — Z1231 Encounter for screening mammogram for malignant neoplasm of breast: Secondary | ICD-10-CM | POA: Diagnosis not present

## 2018-07-14 DIAGNOSIS — K219 Gastro-esophageal reflux disease without esophagitis: Secondary | ICD-10-CM

## 2018-07-14 DIAGNOSIS — Z0000 Encounter for general adult medical examination without abnormal findings: Secondary | ICD-10-CM | POA: Diagnosis not present

## 2018-07-14 DIAGNOSIS — I10 Essential (primary) hypertension: Secondary | ICD-10-CM | POA: Insufficient documentation

## 2018-07-14 DIAGNOSIS — J309 Allergic rhinitis, unspecified: Secondary | ICD-10-CM

## 2018-07-14 MED ORDER — LISINOPRIL-HYDROCHLOROTHIAZIDE 20-12.5 MG PO TABS: 1.0000 | ORAL_TABLET | Freq: Every day | ORAL | 3 refills | Status: DC

## 2018-07-14 MED ORDER — OMEPRAZOLE 40 MG PO CPDR: 40.0000 mg | DELAYED_RELEASE_CAPSULE | Freq: Every day | ORAL | 3 refills | Status: DC

## 2018-07-14 MED ORDER — MONTELUKAST SODIUM 10 MG PO TABS: 10.0000 mg | ORAL_TABLET | Freq: Every day | ORAL | 3 refills | Status: DC

## 2018-07-14 NOTE — Progress Notes (Signed)
No chief complaint on file.  Annual doing well  1. HTN controlled on lis-hctz 20-12.5 taking 1 not 2 pills  2. Fluid in ears on nasacort and claritin stopped singulair but agreeable to restart  3. H/o NMSC BCC nose, SCC right chest appt [facility]erm[facility] 07/2018  4. gerd wants prilosec 40 mg qd   Review of Systems  Constitutional: Negative for weight loss.  HENT: Negative for hearing loss.   Eyes: Negative for blurred vision.  Respiratory: Negative for shortness of breath.   Cardiovascular: Negative for chest pain.  Gastrointestinal: Negative for abdominal pain.  Musculoskeletal: Negative for falls.  Skin: Negative for rash.  Neurological: Negative for headaches.  Psychiatric/Behavioral: Negative for depression.   Past Medical History:  Diagnosis Date  . Allergy   . GERD (gastroesophageal reflux disease)   . Hypertension   . Left tennis elbow   . Morbid obesity ([place]) 07/13/2016   Past Surgical History:  Procedure Laterality Date  . COLONOSCOPY    . COLONOSCOPY WITH PROPOFOL N/A 10/14/2015   Procedure: COLONOSCOPY WITH PROPOFOL;  Surgeon: Lucilla Lame, MD;  Location: [facility];  Service: Endoscopy;  Laterality: N/A;   Family History  Problem Relation Age of Onset  . COPD Mother   . Heart failure Mother   . Pancreatic cancer Mother   . Cancer Mother        pancretic  . Osteopenia Mother   . Arthritis Mother   . Cancer Father        lung/kidney never smoker   . Kidney cancer Father   . Hypertension Father   . Cancer Paternal Aunt        colon and breast cancer in 2 aunts  . Breast cancer Paternal Aunt 58  . Breast cancer Cousin   . Cancer Cousin        breast cancer   . Hypertension Brother   . Atrial fibrillation Brother   . Heart disease Maternal Grandmother   . Cancer Maternal Grandfather        lukemia   Social History   Socioeconomic History  . Marital status: Married    Spouse name: Not on file  . Number of children: Not on file  . Years  of education: Not on file  . Highest education level: Not on file  Occupational History  . Not on file  Social Needs  . Financial resource strain: Not on file  . Food insecurity:    Worry: Not on file    Inability: Not on file  . Transportation needs:    Medical: Not on file    Non-medical: Not on file  Tobacco Use  . Smoking status: Never Smoker  . Smokeless tobacco: Never Used  Substance and Sexual Activity  . Alcohol use: Yes    Alcohol/week: 9.0 standard drinks    Types: 9 Glasses of wine per week  . Drug use: No  . Sexual activity: Yes  Lifestyle  . Physical activity:    Days per week: Not on file    Minutes per session: Not on file  . Stress: Not on file  Relationships  . Social connections:    Talks on phone: Not on file    Gets together: Not on file    Attends religious service: Not on file    Active member of club or organization: Not on file    Attends meetings of clubs or organizations: Not on file    Relationship status: Not on file  .  Intimate partner violence:    Fear of current or ex partner: Not on file    Emotionally abused: Not on file    Physically abused: Not on file    Forced sexual activity: Not on file  Other Topics Concern  . Not on file  Social History Narrative   Diet: moderate.. Working on improving .   Walks 3-4 times a week.    No kids 1 step daughter married    Current Meds  Medication Sig  . lisinopril-hydrochlorothiazide (ZESTORETIC) 20-12.5 MG tablet Take 2 tablets by mouth daily. Not increase dose blood pressure uncontrolled  . loratadine (CLARITIN) 10 MG tablet Take 10 mg by mouth daily.  Marland Kitchen omeprazole (PRILOSEC) 10 MG capsule Take 40 mg by mouth daily.   . Probiotic Product (PROBIOTIC DAILY PO) Take by mouth.  . triamcinolone (NASACORT) 55 MCG/ACT AERO nasal inhaler Place 1 spray into the nose as needed.   . [DISCONTINUED] amoxicillin-clavulanate (AUGMENTIN) 875-125 MG tablet Take 1 tablet by mouth 2 (two) times daily.  .  [DISCONTINUED] mupirocin cream (BACTROBAN) 2 % Apply 1 application topically 2 (two) times daily. To area of concern   Allergies  Allergen Reactions  . Mobic [Meloxicam] Swelling    In hands and ankles and under eyes.  . Erythromycin     Rash, GI upset  . Erythromycin Base   . Influenza Vac Split Quad     Possible allergy- rash after injection  . Shrimp [Shellfish Allergy] Other (See Comments)    Headache when eaten in excess  . Sulfa Antibiotics     unknown  . Azithromycin Rash   No results found for this or any previous visit (from the past 2160 hour(s)). Objective  Body mass index is 44.88 kg/m. Wt Readings from Last 3 Encounters:  07/14/18 243 lb 6.4 oz (110.4 kg)  05/26/18 243 lb (110.2 kg)  01/17/18 239 lb 9.6 oz (108.7 kg)   Temp Readings from Last 3 Encounters:  07/14/18 98.7 F (37.1 C) (Oral)  05/26/18 98.4 F (36.9 C)  01/17/18 98.6 F (37 C) (Tympanic)   BP Readings from Last 3 Encounters:  07/14/18 128/66  05/26/18 (!) 151/69  01/17/18 127/74   Pulse Readings from Last 3 Encounters:  07/14/18 85  05/26/18 74  01/17/18 76    Physical Exam Vitals signs and nursing note reviewed.  Constitutional:      Appearance: Normal appearance. She is well-developed and well-groomed.  HENT:     Head: Normocephalic and atraumatic.     Comments: Mild fluid in b/l ears     Mouth/Throat:     Mouth: Mucous membranes are moist.     Pharynx: Oropharynx is clear.  Eyes:     Conjunctiva/sclera: Conjunctivae normal.     Pupils: Pupils are equal, round, and reactive to light.  Cardiovascular:     Rate and Rhythm: Normal rate and regular rhythm.     Heart sounds: Normal heart sounds. No murmur.  Pulmonary:     Effort: Pulmonary effort is normal.     Breath sounds: Normal breath sounds. No wheezing.  Chest:     Breasts: Breasts are symmetrical.        Right: Skin change present. No swelling, bleeding, inverted nipple, mass, nipple discharge or tenderness.         Left: Normal. No swelling, bleeding, inverted nipple, mass, nipple discharge, skin change or tenderness.    Lymphadenopathy:     Upper Body:     Right upper body: No  axillary adenopathy.     Left upper body: No axillary adenopathy.  Skin:    General: Skin is warm and dry.  Neurological:     General: No focal deficit present.     Mental Status: She is alert and oriented to person, place, and time. Mental status is at baseline.     Gait: Gait normal.  Psychiatric:        Attention and Perception: Attention and perception normal.        Mood and Affect: Mood and affect normal.        Speech: Speech normal.        Behavior: Behavior normal. Behavior is cooperative.        Thought Content: Thought content normal.        Cognition and Memory: Cognition and memory normal.        Judgment: Judgment normal.     Assessment   1. Annual  2. HTN controlled  3. Allergic rhinitis  4. GERD  Plan  1.  Declines flu allergic  Td due 10/2019 Disc shingrix will check with [facility] again today   rec healthy diet and exercise  Breast exam today Referred mammo [place] due 12/07/2018 right breast sebaceous cyst not currently inflammed if continues refer surgery for removal  Pap neg 07/13/16 repeat in 06/2019 Colonoscopy had 10/19/15 Dr. Allen Norris repeat in 5 years had int hemorrhoids  Dermatology appt 07/2018 [place] [facility] using sunscreen congratulated Never smoker congratulated   2. Cont meds refilled  Fasting labs [facility] given [facility] form cmet, cbc, lipid, tsh, t4, ua, vitamin D, hep B 3. Nasacort, claritin add singulair back 4. prilosec 40 mg qd  Provider: Dr. Olivia Mackie McLean-Scocuzza-[facility] Medicine

## 2018-07-14 NOTE — Patient Instructions (Signed)
Call back if your cyst is worse on your breast, right side   DASH Eating Plan DASH stands for "Dietary Approaches to Stop Hypertension." The DASH eating plan is a healthy eating plan that has been shown to reduce high blood pressure (hypertension). It may also reduce your risk for type 2 diabetes, heart disease, and stroke. The DASH eating plan may also help with weight loss. What are tips for following this plan?  General guidelines  Avoid eating more than 2,300 mg (milligrams) of salt (sodium) a day. If you have hypertension, you may need to reduce your sodium intake to 1,500 mg a day.  Limit alcohol intake to no more than 1 drink a day for nonpregnant women and 2 drinks a day for men. One drink equals 12 oz of beer, 5 oz of wine, or 1 oz of hard liquor.  Work with your health care provider to maintain a healthy body weight or to lose weight. Ask what an ideal weight is for you.  Get at least 30 minutes of exercise that causes your heart to beat faster (aerobic exercise) most days of the week. Activities may include walking, swimming, or biking.  Work with your health care provider or diet and nutrition specialist (dietitian) to adjust your eating plan to your individual calorie needs. Reading food labels   Check food labels for the amount of sodium per serving. Choose foods with less than 5 percent of the Daily Value of sodium. Generally, foods with less than 300 mg of sodium per serving fit into this eating plan.  To find whole grains, look for the word "whole" as the first word in the ingredient list. Shopping  Buy products labeled as "low-sodium" or "no salt added."  Buy fresh foods. Avoid canned foods and premade or frozen meals. Cooking  Avoid adding salt when cooking. Use salt-free seasonings or herbs instead of table salt or sea salt. Check with your health care provider or pharmacist before using salt substitutes.  Do not fry foods. Cook foods using healthy methods such as  baking, boiling, grilling, and broiling instead.  Cook with heart-healthy oils, such as olive, canola, soybean, or sunflower oil. Meal planning  Eat a balanced diet that includes: ? 5 or more servings of fruits and vegetables each day. At each meal, try to fill half of your plate with fruits and vegetables. ? Up to 6-8 servings of whole grains each day. ? Less than 6 oz of lean meat, poultry, or fish each day. A 3-oz serving of meat is about the same size as a deck of cards. One egg equals 1 oz. ? 2 servings of low-fat dairy each day. ? A serving of nuts, seeds, or beans 5 times each week. ? Heart-healthy fats. Healthy fats called Omega-3 fatty acids are found in foods such as flaxseeds and coldwater fish, like sardines, salmon, and mackerel.  Limit how much you eat of the following: ? Canned or prepackaged foods. ? Food that is high in trans fat, such as fried foods. ? Food that is high in saturated fat, such as fatty meat. ? Sweets, desserts, sugary drinks, and other foods with added sugar. ? Full-fat dairy products.  Do not salt foods before eating.  Try to eat at least 2 vegetarian meals each week.  Eat more home-cooked food and less restaurant, buffet, and fast food.  When eating at a restaurant, ask that your food be prepared with less salt or no salt, if possible. What foods are recommended?  The items listed may not be a complete list. Talk with your dietitian about what dietary choices are best for you. Grains Whole-grain or whole-wheat bread. Whole-grain or whole-wheat pasta. Brown rice. [place]. Bulgur. Whole-grain and low-sodium cereals. Pita bread. Low-fat, low-sodium crackers. Whole-wheat flour tortillas. Vegetables Fresh or frozen vegetables (raw, steamed, roasted, or grilled). Low-sodium or reduced-sodium tomato and vegetable juice. Low-sodium or reduced-sodium tomato sauce and tomato paste. Low-sodium or reduced-sodium canned vegetables. Fruits All fresh,  dried, or frozen fruit. Canned fruit in natural juice (without added sugar). Meat and other protein foods Skinless chicken or [place]. Ground chicken or [place]. Pork with fat trimmed off. Fish and seafood. Egg whites. Dried beans, peas, or lentils. Unsalted nuts, nut butters, and seeds. Unsalted canned beans. Lean cuts of beef with fat trimmed off. Low-sodium, lean deli meat. Dairy Low-fat (1%) or fat-free (skim) milk. Fat-free, low-fat, or reduced-fat cheeses. Nonfat, low-sodium ricotta or cottage cheese. Low-fat or nonfat yogurt. Low-fat, low-sodium cheese. Fats and oils Soft margarine without trans fats. Vegetable oil. Low-fat, reduced-fat, or light mayonnaise and salad dressings (reduced-sodium). Canola, safflower, olive, soybean, and sunflower oils. Avocado. Seasoning and other foods Herbs. Spices. Seasoning mixes without salt. Unsalted popcorn and pretzels. Fat-free sweets. What foods are not recommended? The items listed may not be a complete list. Talk with your dietitian about what dietary choices are best for you. Grains Baked goods made with fat, such as croissants, muffins, or some breads. Dry pasta or rice meal packs. Vegetables Creamed or fried vegetables. Vegetables in a cheese sauce. Regular canned vegetables (not low-sodium or reduced-sodium). Regular canned tomato sauce and paste (not low-sodium or reduced-sodium). Regular tomato and vegetable juice (not low-sodium or reduced-sodium). Angie Fava. Olives. Fruits Canned fruit in a light or heavy syrup. Fried fruit. Fruit in cream or butter sauce. Meat and other protein foods Fatty cuts of meat. Ribs. Fried meat. Berniece Salines. Sausage. Bologna and other processed lunch meats. Salami. Fatback. Hotdogs. Bratwurst. Salted nuts and seeds. Canned beans with added salt. Canned or smoked fish. Whole eggs or egg yolks. Chicken or [place] with skin. Dairy Whole or 2% milk, cream, and half-and-half. Whole or full-fat cream cheese. Whole-fat or sweetened  yogurt. Full-fat cheese. Nondairy creamers. Whipped toppings. Processed cheese and cheese spreads. Fats and oils Butter. Stick margarine. Lard. Shortening. Ghee. Bacon fat. Tropical oils, such as coconut, palm kernel, or palm oil. Seasoning and other foods Salted popcorn and pretzels. Onion salt, garlic salt, seasoned salt, table salt, and sea salt. Worcestershire sauce. Tartar sauce. Barbecue sauce. Teriyaki sauce. Soy sauce, including reduced-sodium. Steak sauce. Canned and packaged gravies. Fish sauce. Oyster sauce. Cocktail sauce. Horseradish that you find on the shelf. Ketchup. Mustard. Meat flavorings and tenderizers. Bouillon cubes. Hot sauce and Tabasco sauce. Premade or packaged marinades. Premade or packaged taco seasonings. Relishes. Regular salad dressings. Where to find more information:  National Heart, Lung, and [place]: https://wilson-eaton.com/  American Heart Association: www.heart.org Summary  The DASH eating plan is a healthy eating plan that has been shown to reduce high blood pressure (hypertension). It may also reduce your risk for type 2 diabetes, heart disease, and stroke.  With the DASH eating plan, you should limit salt (sodium) intake to 2,300 mg a day. If you have hypertension, you may need to reduce your sodium intake to 1,500 mg a day.  When on the DASH eating plan, aim to eat more fresh fruits and vegetables, whole grains, lean proteins, low-fat dairy, and heart-healthy fats.  Work with your health care  provider or diet and nutrition specialist (dietitian) to adjust your eating plan to your individual calorie needs. This information is not intended to replace advice given to you by your health care provider. Make sure you discuss any questions you have with your health care provider. Document Released: 04/30/2011 Document Revised: 05/04/2016 Document Reviewed: 05/04/2016 Elsevier Interactive Patient Education  2019 [employer].  Hypertension Hypertension,  commonly called high blood pressure, is when the force of blood pumping through the arteries is too strong. The arteries are the blood vessels that carry blood from the heart throughout the body. Hypertension forces the heart to work harder to pump blood and may cause arteries to become narrow or stiff. Having untreated or uncontrolled hypertension can cause heart attacks, strokes, kidney disease, and other problems. A blood pressure reading consists of a higher number over a lower number. Ideally, your blood pressure should be below 120/80. The first ("top") number is called the systolic pressure. It is a measure of the pressure in your arteries as your heart beats. The second ("bottom") number is called the diastolic pressure. It is a measure of the pressure in your arteries as the heart relaxes. What are the causes? The cause of this condition is not known. What increases the risk? Some risk factors for high blood pressure are under your control. Others are not. Factors you can change  Smoking.  Having type 2 diabetes mellitus, high cholesterol, or both.  Not getting enough exercise or physical activity.  Being overweight.  Having too much fat, sugar, calories, or salt (sodium) in your diet.  Drinking too much alcohol. Factors that are difficult or impossible to change  Having chronic kidney disease.  Having a family history of high blood pressure.  Age. Risk increases with age.  Race. You may be at higher risk if you are African-American.  Gender. Men are at higher risk than women before age 74. After age 49, women are at higher risk than men.  Having obstructive sleep apnea.  Stress. What are the signs or symptoms? Extremely high blood pressure (hypertensive crisis) may cause:  Headache.  Anxiety.  Shortness of breath.  Nosebleed.  Nausea and vomiting.  Severe chest pain.  Jerky movements you cannot control (seizures). How is this diagnosed? This condition is  diagnosed by measuring your blood pressure while you are seated, with your arm resting on a surface. The cuff of the blood pressure monitor will be placed directly against the skin of your upper arm at the level of your heart. It should be measured at least twice using the same arm. Certain conditions can cause a difference in blood pressure between your right and left arms. Certain factors can cause blood pressure readings to be lower or higher than normal (elevated) for a short period of time:  When your blood pressure is higher when you are in a health care provider's office than when you are at home, this is called white coat hypertension. Most people with this condition do not need medicines.  When your blood pressure is higher at home than when you are in a health care provider's office, this is called masked hypertension. Most people with this condition may need medicines to control blood pressure. If you have a high blood pressure reading during one visit or you have normal blood pressure with other risk factors:  You may be asked to return on a different day to have your blood pressure checked again.  You may be asked to monitor your  blood pressure at home for 1 week or longer. If you are diagnosed with hypertension, you may have other blood or imaging tests to help your health care provider understand your overall risk for other conditions. How is this treated? This condition is treated by making healthy lifestyle changes, such as eating healthy foods, exercising more, and reducing your alcohol intake. Your health care provider may prescribe medicine if lifestyle changes are not enough to get your blood pressure under control, and if:  Your systolic blood pressure is above 130.  Your diastolic blood pressure is above 80. Your personal target blood pressure may vary depending on your medical conditions, your age, and other factors. Follow these instructions at home: Eating and  drinking   Eat a diet that is high in fiber and potassium, and low in sodium, added sugar, and fat. An example eating plan is called the DASH (Dietary Approaches to Stop Hypertension) diet. To eat this way: ? Eat plenty of fresh fruits and vegetables. Try to fill half of your plate at each meal with fruits and vegetables. ? Eat whole grains, such as whole wheat pasta, brown rice, or whole grain bread. Fill about one quarter of your plate with whole grains. ? Eat or drink low-fat dairy products, such as skim milk or low-fat yogurt. ? Avoid fatty cuts of meat, processed or cured meats, and poultry with skin. Fill about one quarter of your plate with lean proteins, such as fish, chicken without skin, beans, eggs, and tofu. ? Avoid premade and processed foods. These tend to be higher in sodium, added sugar, and fat.  Reduce your daily sodium intake. Most people with hypertension should eat less than 1,500 mg of sodium a day.  Limit alcohol intake to no more than 1 drink a day for nonpregnant women and 2 drinks a day for men. One drink equals 12 oz of beer, 5 oz of wine, or 1 oz of hard liquor. Lifestyle   Work with your health care provider to maintain a healthy body weight or to lose weight. Ask what an ideal weight is for you.  Get at least 30 minutes of exercise that causes your heart to beat faster (aerobic exercise) most days of the week. Activities may include walking, swimming, or biking.  Include exercise to strengthen your muscles (resistance exercise), such as pilates or lifting weights, as part of your weekly exercise routine. Try to do these types of exercises for 30 minutes at least 3 days a week.  Do not use any products that contain nicotine or tobacco, such as cigarettes and e-cigarettes. If you need help quitting, ask your health care provider.  Monitor your blood pressure at home as told by your health care provider.  Keep all follow-up visits as told by your health care  provider. This is important. Medicines  Take over-the-counter and prescription medicines only as told by your health care provider. Follow directions carefully. Blood pressure medicines must be taken as prescribed.  Do not skip doses of blood pressure medicine. Doing this puts you at risk for problems and can make the medicine less effective.  Ask your health care provider about side effects or reactions to medicines that you should watch for. Contact a health care provider if:  You think you are having a reaction to a medicine you are taking.  You have headaches that keep coming back (recurring).  You feel dizzy.  You have swelling in your ankles.  You have trouble with your vision. Get  help right away if:  You develop a severe headache or confusion.  You have unusual weakness or numbness.  You feel faint.  You have severe pain in your chest or abdomen.  You vomit repeatedly.  You have trouble breathing. Summary  Hypertension is when the force of blood pumping through your arteries is too strong. If this condition is not controlled, it may put you at risk for serious complications.  Your personal target blood pressure may vary depending on your medical conditions, your age, and other factors. For most people, a normal blood pressure is less than 120/80.  Hypertension is treated with lifestyle changes, medicines, or a combination of both. Lifestyle changes include weight loss, eating a healthy, low-sodium diet, exercising more, and limiting alcohol. This information is not intended to replace advice given to you by your health care provider. Make sure you discuss any questions you have with your health care provider. Document Released: 05/11/2005 Document Revised: 04/08/2016 Document Reviewed: 04/08/2016 Elsevier Interactive Patient Education  2019 [employer].

## 2018-07-19 ENCOUNTER — Other Ambulatory Visit: Payer: BLUE CROSS/BLUE SHIELD

## 2018-07-19 DIAGNOSIS — Z13220 Encounter for screening for lipoid disorders: Secondary | ICD-10-CM

## 2018-07-19 DIAGNOSIS — Z1329 Encounter for screening for other suspected endocrine disorder: Secondary | ICD-10-CM

## 2018-07-19 DIAGNOSIS — E559 Vitamin D deficiency, unspecified: Secondary | ICD-10-CM

## 2018-07-19 DIAGNOSIS — Z0000 Encounter for general adult medical examination without abnormal findings: Secondary | ICD-10-CM

## 2018-07-20 ENCOUNTER — Other Ambulatory Visit: Payer: Self-pay | Admitting: Internal Medicine

## 2018-07-20 DIAGNOSIS — R7989 Other specified abnormal findings of blood chemistry: Secondary | ICD-10-CM

## 2018-07-20 DIAGNOSIS — D696 Thrombocytopenia, unspecified: Secondary | ICD-10-CM

## 2018-07-20 DIAGNOSIS — R739 Hyperglycemia, unspecified: Secondary | ICD-10-CM

## 2018-07-20 LAB — CBC WITH DIFFERENTIAL/PLATELET
BASOS ABS: 0.1 10*3/uL (ref 0.0–0.2)
Basos: 1 %
EOS (ABSOLUTE): 0.2 10*3/uL (ref 0.0–0.4)
Eos: 3 %
Hematocrit: 45.6 % (ref 34.0–46.6)
Hemoglobin: 14.5 g/dL (ref 11.1–15.9)
IMMATURE GRANS (ABS): 0 10*3/uL (ref 0.0–0.1)
IMMATURE GRANULOCYTES: 0 %
LYMPHS: 25 %
Lymphocytes Absolute: 1.9 10*3/uL (ref 0.7–3.1)
MCH: 27.8 pg (ref 26.6–33.0)
MCHC: 31.8 g/dL (ref 31.5–35.7)
MCV: 88 fL (ref 79–97)
Monocytes Absolute: 0.6 10*3/uL (ref 0.1–0.9)
Monocytes: 7 %
NEUTROS PCT: 64 %
Neutrophils Absolute: 4.9 10*3/uL (ref 1.4–7.0)
PLATELETS: 56 10*3/uL — AB (ref 150–450)
RBC: 5.21 x10E6/uL (ref 3.77–5.28)
RDW: 13.9 % (ref 11.7–15.4)
WBC: 7.6 10*3/uL (ref 3.4–10.8)

## 2018-07-20 LAB — LIPID PANEL
Chol/HDL Ratio: 3.8 ratio (ref 0.0–4.4)
Cholesterol, Total: 191 mg/dL (ref 100–199)
HDL: 50 mg/dL (ref >39)
LDL Calculated: 115 mg/dL — ABNORMAL HIGH (ref 0–99)
TRIGLYCERIDES: 130 mg/dL (ref 0–149)
VLDL Cholesterol Cal: 26 mg/dL (ref 5–40)

## 2018-07-20 LAB — COMPREHENSIVE METABOLIC PANEL
A/G RATIO: 1.7 (ref 1.2–2.2)
ALT: 24 IU/L (ref 0–32)
AST: 15 IU/L (ref 0–40)
Albumin: 4.1 g/dL (ref 3.8–4.9)
Alkaline Phosphatase: 97 IU/L (ref 39–117)
BILIRUBIN TOTAL: 0.6 mg/dL (ref 0.0–1.2)
BUN/Creatinine Ratio: 24 — ABNORMAL HIGH (ref 9–23)
BUN: 15 mg/dL (ref 6–24)
CALCIUM: 9.3 mg/dL (ref 8.7–10.2)
CHLORIDE: 100 mmol/L (ref 96–106)
CO2: 22 mmol/L (ref 20–29)
Creatinine, Ser: 0.62 mg/dL (ref 0.57–1.00)
GFR, EST AFRICAN AMERICAN: 117 mL/min/{1.73_m2} (ref >59)
GFR, EST NON AFRICAN AMERICAN: 101 mL/min/{1.73_m2} (ref >59)
Globulin, Total: 2.4 g/dL (ref 1.5–4.5)
Glucose: 113 mg/dL — ABNORMAL HIGH (ref 65–99)
POTASSIUM: 4 mmol/L (ref 3.5–5.2)
Sodium: 138 mmol/L (ref 134–144)
TOTAL PROTEIN: 6.5 g/dL (ref 6.0–8.5)

## 2018-07-20 LAB — URINALYSIS, ROUTINE W REFLEX MICROSCOPIC
Bilirubin, UA: NEGATIVE
GLUCOSE, UA: NEGATIVE
Ketones, UA: NEGATIVE
Leukocytes, UA: NEGATIVE
NITRITE UA: NEGATIVE
Protein, UA: NEGATIVE
RBC, UA: NEGATIVE
Specific Gravity, UA: 1.01 (ref 1.005–1.030)
Urobilinogen, Ur: 0.2 mg/dL (ref 0.2–1.0)
pH, UA: 7 (ref 5.0–7.5)

## 2018-07-20 LAB — HEPATITIS B SURFACE ANTIBODY, QUANTITATIVE

## 2018-07-20 LAB — VITAMIN D 25 HYDROXY (VIT D DEFICIENCY, FRACTURES): Vit D, 25-Hydroxy: 33 ng/mL (ref 30.0–100.0)

## 2018-07-20 LAB — T4, FREE: FREE T4: 1.29 ng/dL (ref 0.82–1.77)

## 2018-07-20 LAB — TSH: TSH: 4.9 u[IU]/mL — AB (ref 0.450–4.500)

## 2018-07-24 ENCOUNTER — Other Ambulatory Visit: Payer: Self-pay | Admitting: Internal Medicine

## 2018-07-24 DIAGNOSIS — D696 Thrombocytopenia, unspecified: Secondary | ICD-10-CM

## 2018-08-02 ENCOUNTER — Other Ambulatory Visit: Payer: BLUE CROSS/BLUE SHIELD

## 2018-08-02 DIAGNOSIS — R739 Hyperglycemia, unspecified: Secondary | ICD-10-CM

## 2018-08-02 DIAGNOSIS — D696 Thrombocytopenia, unspecified: Secondary | ICD-10-CM

## 2018-08-02 DIAGNOSIS — R7989 Other specified abnormal findings of blood chemistry: Secondary | ICD-10-CM

## 2018-08-03 ENCOUNTER — Other Ambulatory Visit: Payer: BLUE CROSS/BLUE SHIELD

## 2018-08-03 LAB — CBC WITH DIFFERENTIAL/PLATELET
BASOS ABS: 0.1 10*3/uL (ref 0.0–0.2)
Basos: 1 %
EOS (ABSOLUTE): 0.2 10*3/uL (ref 0.0–0.4)
Eos: 3 %
Hematocrit: 42.3 % (ref 34.0–46.6)
Hemoglobin: 14 g/dL (ref 11.1–15.9)
IMMATURE GRANULOCYTES: 1 %
Immature Grans (Abs): 0 10*3/uL (ref 0.0–0.1)
LYMPHS ABS: 1.8 10*3/uL (ref 0.7–3.1)
Lymphs: 22 %
MCH: 29.2 pg (ref 26.6–33.0)
MCHC: 33.1 g/dL (ref 31.5–35.7)
MCV: 88 fL (ref 79–97)
MONOCYTES: 8 %
MONOS ABS: 0.7 10*3/uL (ref 0.1–0.9)
NEUTROS PCT: 65 %
Neutrophils Absolute: 5.5 10*3/uL (ref 1.4–7.0)
Platelets: 233 10*3/uL (ref 150–450)
RBC: 4.79 x10E6/uL (ref 3.77–5.28)
RDW: 13.8 % (ref 11.7–15.4)
WBC: 8.4 10*3/uL (ref 3.4–10.8)

## 2018-08-03 LAB — HEMOGLOBIN A1C
ESTIMATED AVERAGE GLUCOSE: 108 mg/dL
Hgb A1c MFr Bld: 5.4 % (ref 4.8–5.6)

## 2018-08-03 LAB — THYROID PEROXIDASE ANTIBODY

## 2018-08-03 LAB — T3, FREE: T3 FREE: 3.7 pg/mL (ref 2.0–4.4)

## 2018-08-08 ENCOUNTER — Encounter: Payer: Self-pay | Admitting: Internal Medicine

## 2018-10-06 ENCOUNTER — Telehealth: Payer: Self-pay

## 2018-10-06 NOTE — Telephone Encounter (Signed)
Patient called this afternoon c/o sinus headache, facial pressure and a dry mucosa since Monday afternoon.  Pt has been taking Mucinex D but can only take that for 3 days due to hypertension.  We instructed her to continue taking her Claritin and Singulair as prescribed and to start Flonase Nasal Spray OTC.  If her symptoms aren't any better in a week then she is to call us back. Patient verbalizes understanding of instructions.

## 2018-12-12 ENCOUNTER — Other Ambulatory Visit: Payer: Self-pay

## 2018-12-12 ENCOUNTER — Telehealth: Payer: BLUE CROSS/BLUE SHIELD | Admitting: Medical

## 2018-12-12 DIAGNOSIS — H1032 Unspecified acute conjunctivitis, left eye: Secondary | ICD-10-CM

## 2018-12-12 DIAGNOSIS — M9901 Segmental and somatic dysfunction of cervical region: Secondary | ICD-10-CM | POA: Diagnosis not present

## 2018-12-12 DIAGNOSIS — M531 Cervicobrachial syndrome: Secondary | ICD-10-CM | POA: Diagnosis not present

## 2018-12-12 DIAGNOSIS — M5386 Other specified dorsopathies, lumbar region: Secondary | ICD-10-CM | POA: Diagnosis not present

## 2018-12-12 MED ORDER — CIPROFLOXACIN HCL 0.3 % OP OINT: TOPICAL_OINTMENT | OPHTHALMIC | 0 refills | Status: DC

## 2018-12-12 NOTE — Progress Notes (Signed)
Patient gives permission to do telemedicine appointment. Started 3 weeks ago wiped the Left eye with tissue and though perhaps she scratched it , though no pain.,erythema 4/10 to the medial side her left eye. Iris and  And pupil seem fine. Does not itchy and , doesn't hurt.   History of seasonal allergies with loratadine and flonase NS daily. Doing well on this regiimen. Sometimes has a dry nose, with  A mild irritation and bleeding , she then stops using it for a few days and then needs to restart it due to eustachian tube dysfunction bilaterally. (she feels the pressure)   Discharge white in the morning , watering, denies blurry or double vision.  No cough , sob, cp or fever or chills.  No dizziness, lightheadness, no visual changes.   No PE due to televisit with patient.   Dx Conjunctivitis Explained viral , bacterial and allergic. Due to 3 week history will treat as if it is bacterial. Meds ordered this encounter  Medications  . ciprofloxacin (CILOXAN) 0.3 % ophthalmic ointment    Sig: One drop Every 4 hours while awake  For  5-7 days to the left eye,Wash hands before and after use.    Dispense:  3.5 g    Refill:  0   Reviewed hygiene and how to put medication into eye. Called about Pre-Authorization.  Patient verbalizes understanding and had no questions at the end of our conversation. To call the clinic if she had any concerns.

## 2018-12-13 ENCOUNTER — Telehealth: Payer: Self-pay | Admitting: Medical

## 2018-12-13 NOTE — Telephone Encounter (Signed)
Called patient  3:30 pm and  4pm on 7/20 with no answer left message prescription pending due to pre authorization.  Talke to patient this morning about status of medication. Reviewed hygiene and how to apply medication in eye. Went with a n ointment because of eye tearing a lot.  Patient verbalizes understanding and has no questions at the end of our conversation. Patient very thankful for our care.

## 2018-12-29 ENCOUNTER — Telehealth: Payer: Self-pay | Admitting: Internal Medicine

## 2018-12-29 NOTE — Telephone Encounter (Signed)
There is nothing else unless she wants to see an allergist or ENT> Or she can try ot used a humidifier with purified water and nasal saline nose spray over the counter

## 2018-12-29 NOTE — Telephone Encounter (Signed)
Pt stated the medication montelukast (SINGULAIR) 10 MG tablet dries her up; to much and want to know if there is something else she can take. Please advise. She will be available afte 4pm today

## 2018-12-29 NOTE — Telephone Encounter (Signed)
Sent mychaqrt

## 2019-02-14 ENCOUNTER — Ambulatory Visit (INDEPENDENT_AMBULATORY_CARE_PROVIDER_SITE_OTHER): Payer: BC Managed Care – PPO | Admitting: Internal Medicine

## 2019-02-14 ENCOUNTER — Other Ambulatory Visit: Payer: Self-pay

## 2019-02-14 DIAGNOSIS — E785 Hyperlipidemia, unspecified: Secondary | ICD-10-CM

## 2019-02-14 DIAGNOSIS — D696 Thrombocytopenia, unspecified: Secondary | ICD-10-CM

## 2019-02-14 DIAGNOSIS — R7989 Other specified abnormal findings of blood chemistry: Secondary | ICD-10-CM | POA: Diagnosis not present

## 2019-02-14 DIAGNOSIS — I10 Essential (primary) hypertension: Secondary | ICD-10-CM | POA: Diagnosis not present

## 2019-02-14 MED ORDER — OLMESARTAN MEDOXOMIL-HCTZ 20-12.5 MG PO TABS: 1.0000 | ORAL_TABLET | Freq: Every day | ORAL | 3 refills | Status: DC

## 2019-02-14 NOTE — Progress Notes (Signed)
Virtual Visit via Video Note  I connected with Bridget Reed  on 02/14/19 at 11:20 AM EDT by a video enabled telemedicine application and verified that I am speaking with the correct person using two identified: work Clinical research associate or home office Persons participating in the virtual visit: patient, provider  I discussed the limitations of evaluation and management by telemedicine and the availability of in person appointments. The patient expressed understanding and agreed to proceed.   HPI: 1. C/o dry cough on lis-hctz 20-12.5 reviewed this could be side effect and pt agreeable to change she tried singulair which she takes qod due to this causing her nose to be too dry  GERD is controlled on ppi 40 mg qd  2. Encouraged to sch mammogram    ROS: See pertinent positives and negatives per HPI.  Past Medical History:  Diagnosis Date  . Allergy   . GERD (gastroesophageal reflux disease)   . Hypertension   . Left tennis elbow   . Morbid obesity ([place]) 07/13/2016    Past Surgical History:  Procedure Laterality Date  . COLONOSCOPY    . COLONOSCOPY WITH PROPOFOL N/A 10/14/2015   Procedure: COLONOSCOPY WITH PROPOFOL;  Surgeon: Lucilla Lame, MD;  Location: [place];  Service: Endoscopy;  Laterality: N/A;    Family History  Problem Relation Age of Onset  . COPD Mother   . Heart failure Mother   . Pancreatic cancer Mother   . Cancer Mother        pancretic  . Osteopenia Mother   . Arthritis Mother   . Cancer Father        lung/kidney never smoker   . Kidney cancer Father   . Hypertension Father   . Cancer Paternal Aunt        colon and breast cancer in 2 aunts  . Breast cancer Paternal Aunt 10  . Breast cancer Cousin   . Cancer Cousin        breast cancer   . Hypertension Brother   . Atrial fibrillation Brother   . Heart disease Maternal Grandmother   . Cancer Maternal Grandfather        lukemia    SOCIAL HX: works [employer]   Current Outpatient  Medications:  .  ciprofloxacin (CILOXAN) 0.3 % ophthalmic ointment, One drop Every 4 hours while awake  For  5-7 days to the left eye,Wash hands before and after use., Disp: 3.5 g, Rfl: 0 .  loratadine (CLARITIN) 10 MG tablet, Take 10 mg by mouth daily., Disp: , Rfl:  .  montelukast (SINGULAIR) 10 MG tablet, Take 1 tablet (10 mg total) by mouth at bedtime., Disp: 90 tablet, Rfl: 3 .  omeprazole (PRILOSEC) 40 MG capsule, Take 1 capsule (40 mg total) by mouth daily. In am 30 minutes before breakfast, Disp: 90 capsule, Rfl: 3 .  Probiotic Product (PROBIOTIC DAILY PO), Take by mouth., Disp: , Rfl:  .  triamcinolone (NASACORT) 55 MCG/ACT AERO nasal inhaler, Place 1 spray into the nose as needed. , Disp: , Rfl:  .  olmesartan-hydrochlorothiazide (BENICAR HCT) 20-12.5 MG tablet, Take 1 tablet by mouth daily. In am, Disp: 90 tablet, Rfl: 3  EXAM:  VITALS per patient if applicable:  GENERAL: alert, oriented, appears well and in no acute distress  HEENT: atraumatic, conjunttiva clear, no obvious abnormalities on inspection of external nose and ears  NECK: normal movements of the head and neck  LUNGS: on inspection no signs of respiratory distress, breathing rate appears normal,  no obvious gross SOB, gasping or wheezing  CV: no obvious cyanosis  MS: moves all visible extremities without noticeable abnormality  PSYCH/NEURO: pleasant and cooperative, no obvious depression or anxiety, speech and thought processing grossly intact  ASSESSMENT AND PLAN:  Discussed the following assessment and plan:  Essential hypertension - Plan: olmesartan-hydrochlorothiazide (BENICAR HCT) 20-12.5 MG tablet  STOP LIS-HCTZ 20-12.5 DUE TO COUGH sch fasting labs [facility] and bp check  Comprehensive metabolic panel, CBC with Differential/Platelet, Lipid panel  Hyperlipidemia, unspecified hyperlipidemia type Recheck   Elevated TSH - Plan: TSH  Thrombocytopenia ([place]) - Plan: CBC with Differential/Platelet To repeat  again   HM Declines flu allergic  Td due 10/2019 Disc shingrix will check with [employer]   rec healthy diet and exercise  Breast exam prev 06/2018  Referred mammo [place] due 12/07/2018 right breast sebaceous cyst not currently inflammed if continues refer surgery for removal  -encouraged pt to schedule   Pap neg 07/13/16 repeat in 06/2019  Colonoscopy had 10/19/15 Dr. Allen Norris repeat in 5 years had int hemorrhoids   Dermatology appt 02/2019 [place]  Skinusing sunscreen congratulated Never smoker congratulated   -we discussed possible serious and likely etiologies, options for evaluation and workup, limitations of telemedicine visit vs in person visit, treatment, treatment risks and precautions. Pt prefers to treat via telemedicine empirically rather then risking or undertaking an in person visit at this moment. Patient agrees to seek prompt in person care if worsening, new symptoms arise, or if is not improving with treatment.   I discussed the assessment and treatment plan with the patient. The patient was provided an opportunity to ask questions and all were answered. The patient agreed with the plan and demonstrated an understanding of the instructions.   The patient was advised to call back or seek an in-person evaluation if the symptoms worsen or if the condition fails to improve as anticipated.  15 minutes  Delorise Jackson, MD

## 2019-02-15 ENCOUNTER — Ambulatory Visit: Payer: BC Managed Care – PPO

## 2019-02-15 ENCOUNTER — Other Ambulatory Visit: Payer: Self-pay

## 2019-02-15 ENCOUNTER — Encounter: Payer: Self-pay | Admitting: Medical

## 2019-02-15 DIAGNOSIS — Z23 Encounter for immunization: Secondary | ICD-10-CM

## 2019-02-21 ENCOUNTER — Other Ambulatory Visit: Payer: Self-pay

## 2019-02-21 ENCOUNTER — Other Ambulatory Visit: Payer: BC Managed Care – PPO

## 2019-02-21 DIAGNOSIS — D696 Thrombocytopenia, unspecified: Secondary | ICD-10-CM

## 2019-02-21 DIAGNOSIS — I10 Essential (primary) hypertension: Secondary | ICD-10-CM

## 2019-02-21 DIAGNOSIS — R7989 Other specified abnormal findings of blood chemistry: Secondary | ICD-10-CM

## 2019-02-22 LAB — CBC WITH DIFFERENTIAL/PLATELET
Basophils Absolute: 0.1 10*3/uL (ref 0.0–0.2)
Basos: 1 %
EOS (ABSOLUTE): 0.2 10*3/uL (ref 0.0–0.4)
Eos: 2 %
Hematocrit: 44.6 % (ref 34.0–46.6)
Hemoglobin: 15.1 g/dL (ref 11.1–15.9)
Immature Grans (Abs): 0 10*3/uL (ref 0.0–0.1)
Immature Granulocytes: 0 %
Lymphocytes Absolute: 2.3 10*3/uL (ref 0.7–3.1)
Lymphs: 25 %
MCH: 29.2 pg (ref 26.6–33.0)
MCHC: 33.9 g/dL (ref 31.5–35.7)
MCV: 86 fL (ref 79–97)
Monocytes Absolute: 0.8 10*3/uL (ref 0.1–0.9)
Monocytes: 9 %
Neutrophils Absolute: 5.7 10*3/uL (ref 1.4–7.0)
Neutrophils: 63 %
Platelets: 268 10*3/uL (ref 150–450)
RBC: 5.17 x10E6/uL (ref 3.77–5.28)
RDW: 13.2 % (ref 11.7–15.4)
WBC: 9.1 10*3/uL (ref 3.4–10.8)

## 2019-02-22 LAB — COMPREHENSIVE METABOLIC PANEL
ALT: 18 IU/L (ref 0–32)
AST: 16 IU/L (ref 0–40)
Albumin/Globulin Ratio: 1.7 (ref 1.2–2.2)
Albumin: 3.9 g/dL (ref 3.8–4.9)
Alkaline Phosphatase: 92 IU/L (ref 39–117)
BUN/Creatinine Ratio: 23 (ref 9–23)
BUN: 19 mg/dL (ref 6–24)
Bilirubin Total: 0.6 mg/dL (ref 0.0–1.2)
CO2: 22 mmol/L (ref 20–29)
Calcium: 9.3 mg/dL (ref 8.7–10.2)
Chloride: 102 mmol/L (ref 96–106)
Creatinine, Ser: 0.83 mg/dL (ref 0.57–1.00)
GFR calc Af Amer: 91 mL/min/{1.73_m2} (ref >59)
GFR calc non Af Amer: 79 mL/min/{1.73_m2} (ref >59)
Globulin, Total: 2.3 g/dL (ref 1.5–4.5)
Glucose: 103 mg/dL — ABNORMAL HIGH (ref 65–99)
Potassium: 4.8 mmol/L (ref 3.5–5.2)
Sodium: 139 mmol/L (ref 134–144)
Total Protein: 6.2 g/dL (ref 6.0–8.5)

## 2019-02-22 LAB — LIPID PANEL
Chol/HDL Ratio: 3.5 ratio (ref 0.0–4.4)
Cholesterol, Total: 162 mg/dL (ref 100–199)
HDL: 46 mg/dL (ref >39)
LDL Chol Calc (NIH): 94 mg/dL (ref 0–99)
Triglycerides: 124 mg/dL (ref 0–149)
VLDL Cholesterol Cal: 22 mg/dL (ref 5–40)

## 2019-02-23 LAB — TSH: TSH: 4.5 u[IU]/mL (ref 0.450–4.500)

## 2019-07-17 ENCOUNTER — Other Ambulatory Visit: Payer: Self-pay

## 2019-07-18 ENCOUNTER — Other Ambulatory Visit (HOSPITAL_COMMUNITY)
Admission: RE | Admit: 2019-07-18 | Discharge: 2019-07-18 | Disposition: A | Discharge status: Home / Self Care | Payer: BC Managed Care – PPO | Source: Ambulatory Visit | Attending: Internal Medicine | Admitting: Internal Medicine

## 2019-07-18 ENCOUNTER — Ambulatory Visit (INDEPENDENT_AMBULATORY_CARE_PROVIDER_SITE_OTHER): Payer: BC Managed Care – PPO | Admitting: Internal Medicine

## 2019-07-18 ENCOUNTER — Other Ambulatory Visit: Payer: Self-pay

## 2019-07-18 ENCOUNTER — Encounter: Payer: Self-pay | Admitting: Internal Medicine

## 2019-07-18 VITALS — BP 124/80 | HR 74 | Temp 96.9°F | Ht 61.75 in | Wt 244.6 lb

## 2019-07-18 DIAGNOSIS — Z1389 Encounter for screening for other disorder: Secondary | ICD-10-CM

## 2019-07-18 DIAGNOSIS — I10 Essential (primary) hypertension: Secondary | ICD-10-CM

## 2019-07-18 DIAGNOSIS — K219 Gastro-esophageal reflux disease without esophagitis: Secondary | ICD-10-CM

## 2019-07-18 DIAGNOSIS — J309 Allergic rhinitis, unspecified: Secondary | ICD-10-CM | POA: Diagnosis not present

## 2019-07-18 DIAGNOSIS — R609 Edema, unspecified: Secondary | ICD-10-CM

## 2019-07-18 DIAGNOSIS — Z0000 Encounter for general adult medical examination without abnormal findings: Secondary | ICD-10-CM | POA: Diagnosis not present

## 2019-07-18 DIAGNOSIS — R946 Abnormal results of thyroid function studies: Secondary | ICD-10-CM

## 2019-07-18 DIAGNOSIS — Z124 Encounter for screening for malignant neoplasm of cervix: Secondary | ICD-10-CM | POA: Diagnosis not present

## 2019-07-18 DIAGNOSIS — Z1231 Encounter for screening mammogram for malignant neoplasm of breast: Secondary | ICD-10-CM

## 2019-07-18 DIAGNOSIS — E611 Iron deficiency: Secondary | ICD-10-CM

## 2019-07-18 MED ORDER — MONTELUKAST SODIUM 10 MG PO TABS: 10.0000 mg | ORAL_TABLET | Freq: Every day | ORAL | 3 refills | Status: DC

## 2019-07-18 MED ORDER — OMEPRAZOLE 40 MG PO CPDR: 40.0000 mg | DELAYED_RELEASE_CAPSULE | Freq: Every day | ORAL | 3 refills | Status: DC

## 2019-07-18 MED ORDER — FUROSEMIDE 20 MG PO TABS: 20.0000 mg | ORAL_TABLET | Freq: Every day | ORAL | 0 refills | Status: DC | PRN

## 2019-07-18 NOTE — Patient Instructions (Addendum)
COVID-19 Vaccine Information can be found at: [email or web address] For questions related to vaccine distribution or appointments, please email [email or web address] or call [phone].   Call back for shingrix  Tdap due 10/2019  Fasting labs [facility]  Schedule mammogram  Vitamin D3 2000 IU daily with multivitamin   Zoster Vaccine, Recombinant injection What is this medicine? ZOSTER VACCINE (ZOS ter vak SEEN) is used to prevent shingles in adults 58 years old and over. This vaccine is not used to treat shingles or nerve pain from shingles. This medicine may be used for other purposes; ask your health care provider or pharmacist if you have questions. COMMON BRAND NAME(S): [facility] What should I tell my health care provider before I take this medicine? They need to know if you have any of these conditions:  blood disorders or disease  cancer like leukemia or lymphoma  immune system problems or therapy  an unusual or allergic reaction to vaccines, other medications, foods, dyes, or preservatives  pregnant or trying to get pregnant  breast-feeding How should I use this medicine? This vaccine is for injection in a muscle. It is given by a health care professional. Talk to your pediatrician regarding the use of this medicine in children. This medicine is not approved for use in children. Overdosage: If you think you have taken too much of this medicine contact a poison control center or emergency room at once. NOTE: This medicine is only for you. Do not share this medicine with others. What if I miss a dose? Keep appointments for follow-up (booster) doses as directed. It is important not to miss your dose. Call your doctor or health care professional if you are unable to keep an appointment. What may interact with this medicine?  medicines that suppress your immune system  medicines to treat cancer  steroid medicines like  prednisone or cortisone This list may not describe all possible interactions. Give your health care provider a list of all the medicines, herbs, non-prescription drugs, or dietary supplements you use. Also tell them if you smoke, drink alcohol, or use illegal drugs. Some items may interact with your medicine. What should I watch for while using this medicine? Visit your doctor for regular check ups. This vaccine, like all vaccines, may not fully protect everyone. What side effects may I notice from receiving this medicine? Side effects that you should report to your doctor or health care professional as soon as possible:  allergic reactions like skin rash, itching or hives, swelling of the face, lips, or tongue  breathing problems Side effects that usually do not require medical attention (report these to your doctor or health care professional if they continue or are bothersome):  chills  headache  fever  nausea, vomiting  redness, warmth, pain, swelling or itching at site where injected  tiredness This list may not describe all possible side effects. Call your doctor for medical advice about side effects. You may report side effects to FDA at 1-800-FDA-1088. Where should I keep my medicine? This vaccine is only given in a clinic, pharmacy, doctor's office, or other health care setting and will not be stored at home. NOTE: This sheet is a summary. It may not cover all possible information. If you have questions about this medicine, talk to your doctor, pharmacist, or health care provider.  2020 Elsevier/Gold Standard (2016-12-21 13:20:30)

## 2019-07-18 NOTE — Progress Notes (Signed)
Chief Complaint  Patient presents with  . Annual Exam   Annual doing well no complaints  1. HTN on benicar 20-12.5 doing well BP controlled today  2. Allergies controlled takes singulair 10 mg qod as this dries out her nose  3. Right breast inner sebaceous cyst not painful area on breast still red due for repeat screening mammogram will refer today for patient to schedule  4. She does c/o swelling in arms and legs and feeling of being tight had dip and tostitos over the weekend  5. gerd controlled on prilosec 40 mg qd   Review of Systems  Constitutional: Positive for weight loss.       Down 10 lbs per pt since 05/2019 trying   HENT: Negative for hearing loss.   Eyes: Negative for blurred vision.  Respiratory: Negative for shortness of breath.   Cardiovascular: Negative for chest pain.  Gastrointestinal: Negative for abdominal pain and blood in stool.  Musculoskeletal: Negative for falls.  Skin: Negative for rash.  Neurological: Negative for weakness.  Psychiatric/Behavioral: Negative for depression and memory loss.   Past Medical History:  Diagnosis Date  . Allergy   . GERD (gastroesophageal reflux disease)   . Hypertension   . Left tennis elbow   . Morbid obesity ([place]) 07/13/2016   Past Surgical History:  Procedure Laterality Date  . COLONOSCOPY    . COLONOSCOPY WITH PROPOFOL N/A 10/14/2015   Procedure: COLONOSCOPY WITH PROPOFOL;  Surgeon: Lucilla Lame, MD;  Location: [place];  Service: Endoscopy;  Laterality: N/A;   Family History  Problem Relation Age of Onset  . COPD Mother   . Heart failure Mother   . Pancreatic cancer Mother   . Cancer Mother        pancretic  . Osteopenia Mother   . Arthritis Mother   . Cancer Father        lung/kidney never smoker   . Kidney cancer Father   . Hypertension Father   . Cancer Paternal Aunt        colon and breast cancer in 2 aunts  . Breast cancer Paternal Aunt 70  . Breast cancer Cousin   . Cancer Cousin    breast cancer   . Hypertension Brother   . Atrial fibrillation Brother   . Heart disease Maternal Grandmother   . Cancer Maternal Grandfather        lukemia   Social History   Socioeconomic History  . Marital status: Married    Spouse name: Not on file  . Number of children: Not on file  . Years of education: Not on file  . Highest education level: Not on file  Occupational History  . Not on file  Tobacco Use  . Smoking status: Never Smoker  . Smokeless tobacco: Never Used  Substance and Sexual Activity  . Alcohol use: Yes    Alcohol/week: 9.0 standard drinks    Types: 9 Glasses of wine per week  . Drug use: No  . Sexual activity: Yes  Other Topics Concern  . Not on file  Social History Narrative   Diet: moderate.. Working on improving .   Walks 3-4 times a week.    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 Difficulty of Paying Living Expenses: Not on file  Food Insecurity:   . Worried About Charity fundraiser in the Last Year: Not on file  .  Ran Out of Food in the Last Year: Not on file  Transportation Needs:   . Lack of Transportation (Medical): Not on file  . Lack of Transportation (Non-Medical): Not on file  Physical Activity:   . Days of Exercise per Week: Not on file  . Minutes of Exercise per Session: Not on file  Stress:   . Feeling of Stress : Not on file  Social Connections:   . Frequency of Communication with Friends and Family: Not on file  . Frequency of Social Gatherings with Friends and Family: Not on file  . Attends Religious Services: Not on file  . Active Member of Clubs or Organizations: Not on file  . Attends Archivist Meetings: Not on file  . Marital Status: Not on file  Intimate Partner Violence:   . Fear of Current or Ex-Partner: Not on file  . Emotionally Abused: Not on file  . Physically Abused: Not on file  . Sexually Abused: Not on file   Current  Meds  Medication Sig  . loratadine (CLARITIN) 10 MG tablet Take 10 mg by mouth daily.  . montelukast (SINGULAIR) 10 MG tablet Take 1 tablet (10 mg total) by mouth at bedtime.  Marland Kitchen olmesartan-hydrochlorothiazide (BENICAR HCT) 20-12.5 MG tablet Take 1 tablet by mouth daily. In am  . omeprazole (PRILOSEC) 40 MG capsule Take 1 capsule (40 mg total) by mouth daily. In am 30 minutes before breakfast  . Probiotic Product (PROBIOTIC DAILY PO) Take by mouth.  . triamcinolone (NASACORT) 55 MCG/ACT AERO nasal inhaler Place 1 spray into the nose as needed.   . [DISCONTINUED] montelukast (SINGULAIR) 10 MG tablet Take 1 tablet (10 mg total) by mouth at bedtime.  . [DISCONTINUED] omeprazole (PRILOSEC) 40 MG capsule Take 1 capsule (40 mg total) by mouth daily. In am 30 minutes before breakfast   Allergies  Allergen Reactions  . Mobic [Meloxicam] Swelling    In hands and ankles and under eyes.  . Erythromycin     Rash, GI upset  . Erythromycin Base   . Influenza Vac Split Quad     Possible allergy- rash after injection  . Shrimp [Shellfish Allergy] Other (See Comments)    Headache when eaten in excess  . Sulfa Antibiotics     unknown  . Azithromycin Rash   No results found for this or any previous visit (from the past 2160 hour(s)). Objective  Body mass index is 45.1 kg/m. Wt Readings from Last 3 Encounters:  07/18/19 244 lb 9.6 oz (110.9 kg)  07/14/18 243 lb 6.4 oz (110.4 kg)  05/26/18 243 lb (110.2 kg)   Temp Readings from Last 3 Encounters:  07/18/19 (!) 96.9 F (36.1 C) (Temporal)  07/14/18 98.7 F (37.1 C) (Oral)  05/26/18 98.4 F (36.9 C)   BP Readings from Last 3 Encounters:  07/18/19 124/80  07/14/18 128/66  05/26/18 (!) 151/69   Pulse Readings from Last 3 Encounters:  07/18/19 74  07/14/18 85  05/26/18 74    Physical Exam Vitals and nursing note reviewed.  Constitutional:      Appearance: Normal appearance. She is well-developed and well-groomed. She is morbidly obese.    HENT:     Head: Normocephalic and atraumatic.  Eyes:     Conjunctiva/sclera: Conjunctivae normal.     Pupils: Pupils are equal, round, and reactive to light.  Cardiovascular:     Rate and Rhythm: Normal rate and regular rhythm.     Heart sounds: Normal heart sounds. No murmur.  Pulmonary:     Effort: Pulmonary effort is normal.     Breath sounds: Normal breath sounds.  Chest:     Chest wall: No mass.     Breasts: Breasts are symmetrical.        Right: No swelling, bleeding, inverted nipple, mass, nipple discharge, skin change or tenderness.        Left: No swelling, bleeding, inverted nipple, mass, nipple discharge, skin change or tenderness.    Abdominal:     General: Abdomen is flat. Bowel sounds are normal.     Tenderness: There is no abdominal tenderness.  Genitourinary:    Pubic Area: No rash.      Labia:        Right: No rash.        Left: No rash.      Vagina: Normal.     Cervix: Normal.     Uterus: Normal.      Adnexa: Right adnexa normal and left adnexa normal.     Comments: Yellow to white d/c w/o sx's   Musculoskeletal:     Right lower leg: No edema.     Left lower leg: No edema.  Lymphadenopathy:     Upper Body:     Right upper body: No axillary adenopathy.     Left upper body: No axillary adenopathy.  Skin:    General: Skin is warm and dry.  Neurological:     General: No focal deficit present.     Mental Status: She is alert and oriented to person, place, and time. Mental status is at baseline.     Gait: Gait normal.  Psychiatric:        Attention and Perception: Attention and perception normal.        Mood and Affect: Mood and affect normal.        Speech: Speech normal.        Behavior: Behavior normal. Behavior is cooperative.        Thought Content: Thought content normal.        Cognition and Memory: Cognition and memory normal.        Judgment: Judgment normal.     Assessment  Plan  Annual physical exam Flu shot utd 02/15/2019 Td due  6/2021pt to call to schedule  Disc shingrix will check with [facility] or call back to schedule  Given info about covid 19 vaccine considering  Consider hep B vaccine  Check Hep C status   rec healthy diet and exercise   Breast exam today normal but redness 4-5 oclock right inner breast 2 cm area of prev sebacous cyst  Referred mammo [place] due7/15/2020 right breast sebaceous cyst not currently inflammed if continues refer surgery for removal -encouraged pt to schedule today   Pap neg 07/13/16 repeat today remote h/o abnormal pap some discharge today w/o sx's  Colonoscopy had 10/19/15 Dr. Allen Norris repeat in 5 years had int hemorrhoids   Dermatology appt3 or 4/2021[place] Skinusing sunscreen congratulated  Never smoker congratulateddoes not like smoking never did   rec mvt + vitamin D3 2000 IU qd    Allergic rhinitis, unspecified seasonality, unspecified trigger - Plan: montelukast (SINGULAIR) 10 MG tablet  Gastroesophageal reflux disease - Plan: omeprazole (PRILOSEC) 40 MG capsule  Edema, unspecified type - Plan: furosemide (LASIX) 20 MG tablet daily prn   Provider: Dr. Olivia Mackie McLean-Scocuzza-Internal Medicine

## 2019-07-19 ENCOUNTER — Other Ambulatory Visit: Payer: Self-pay | Admitting: *Deleted

## 2019-07-19 DIAGNOSIS — E611 Iron deficiency: Secondary | ICD-10-CM

## 2019-07-19 DIAGNOSIS — I10 Essential (primary) hypertension: Secondary | ICD-10-CM

## 2019-07-19 DIAGNOSIS — R946 Abnormal results of thyroid function studies: Secondary | ICD-10-CM

## 2019-07-19 DIAGNOSIS — Z1389 Encounter for screening for other disorder: Secondary | ICD-10-CM

## 2019-07-19 LAB — CYTOLOGY - PAP
Adequacy: ABSENT
Comment: NEGATIVE
Diagnosis: NEGATIVE
High risk HPV: NEGATIVE

## 2019-07-25 ENCOUNTER — Other Ambulatory Visit: Payer: Self-pay

## 2019-07-25 DIAGNOSIS — I10 Essential (primary) hypertension: Secondary | ICD-10-CM

## 2019-07-25 DIAGNOSIS — R946 Abnormal results of thyroid function studies: Secondary | ICD-10-CM

## 2019-07-25 DIAGNOSIS — E611 Iron deficiency: Secondary | ICD-10-CM

## 2019-07-25 DIAGNOSIS — Z1389 Encounter for screening for other disorder: Secondary | ICD-10-CM

## 2019-07-26 ENCOUNTER — Other Ambulatory Visit: Payer: Self-pay | Admitting: Internal Medicine

## 2019-07-26 ENCOUNTER — Encounter: Payer: Self-pay | Admitting: Internal Medicine

## 2019-07-26 DIAGNOSIS — R7989 Other specified abnormal findings of blood chemistry: Secondary | ICD-10-CM

## 2019-07-26 DIAGNOSIS — D751 Secondary polycythemia: Secondary | ICD-10-CM

## 2019-07-26 LAB — CBC WITH DIFFERENTIAL/PLATELET
Basophils Absolute: 0.1 10*3/uL (ref 0.0–0.2)
Basos: 1 %
EOS (ABSOLUTE): 0.1 10*3/uL (ref 0.0–0.4)
Eos: 2 %
Hematocrit: 47.6 % — ABNORMAL HIGH (ref 34.0–46.6)
Hemoglobin: 15.4 g/dL (ref 11.1–15.9)
Immature Grans (Abs): 0 10*3/uL (ref 0.0–0.1)
Immature Granulocytes: 0 %
Lymphocytes Absolute: 1.7 10*3/uL (ref 0.7–3.1)
Lymphs: 21 %
MCH: 28.7 pg (ref 26.6–33.0)
MCHC: 32.4 g/dL (ref 31.5–35.7)
MCV: 89 fL (ref 79–97)
Monocytes Absolute: 0.7 10*3/uL (ref 0.1–0.9)
Monocytes: 9 %
Neutrophils Absolute: 5.6 10*3/uL (ref 1.4–7.0)
Neutrophils: 67 %
Platelets: 262 10*3/uL (ref 150–450)
RBC: 5.37 x10E6/uL — ABNORMAL HIGH (ref 3.77–5.28)
RDW: 13.4 % (ref 11.7–15.4)
WBC: 8.2 10*3/uL (ref 3.4–10.8)

## 2019-07-26 LAB — URINALYSIS, ROUTINE W REFLEX MICROSCOPIC
Bilirubin, UA: NEGATIVE
Glucose, UA: NEGATIVE
Ketones, UA: NEGATIVE
Leukocytes,UA: NEGATIVE
Nitrite, UA: NEGATIVE
Protein,UA: NEGATIVE
RBC, UA: NEGATIVE
Specific Gravity, UA: 1.008 (ref 1.005–1.030)
Urobilinogen, Ur: 0.2 mg/dL (ref 0.2–1.0)
pH, UA: 7.5 (ref 5.0–7.5)

## 2019-07-26 LAB — COMPREHENSIVE METABOLIC PANEL
ALT: 25 IU/L (ref 0–32)
AST: 23 IU/L (ref 0–40)
Albumin/Globulin Ratio: 2 (ref 1.2–2.2)
Albumin: 4.3 g/dL (ref 3.8–4.9)
Alkaline Phosphatase: 94 IU/L (ref 39–117)
BUN/Creatinine Ratio: 17 (ref 9–23)
BUN: 12 mg/dL (ref 6–24)
Bilirubin Total: 0.7 mg/dL (ref 0.0–1.2)
CO2: 24 mmol/L (ref 20–29)
Calcium: 9.4 mg/dL (ref 8.7–10.2)
Chloride: 100 mmol/L (ref 96–106)
Creatinine, Ser: 0.72 mg/dL (ref 0.57–1.00)
GFR calc Af Amer: 108 mL/min/{1.73_m2} (ref >59)
GFR calc non Af Amer: 93 mL/min/{1.73_m2} (ref >59)
Globulin, Total: 2.2 g/dL (ref 1.5–4.5)
Glucose: 101 mg/dL — ABNORMAL HIGH (ref 65–99)
Potassium: 4.3 mmol/L (ref 3.5–5.2)
Sodium: 140 mmol/L (ref 134–144)
Total Protein: 6.5 g/dL (ref 6.0–8.5)

## 2019-07-26 LAB — LIPID PANEL
Chol/HDL Ratio: 3 ratio (ref 0.0–4.4)
Cholesterol, Total: 175 mg/dL (ref 100–199)
HDL: 58 mg/dL (ref >39)
LDL Chol Calc (NIH): 97 mg/dL (ref 0–99)
Triglycerides: 110 mg/dL (ref 0–149)
VLDL Cholesterol Cal: 20 mg/dL (ref 5–40)

## 2019-07-26 LAB — IRON,TIBC AND FERRITIN PANEL
Ferritin: 240 ng/mL — ABNORMAL HIGH (ref 15–150)
Iron Saturation: 29 % (ref 15–55)
Iron: 78 ug/dL (ref 27–159)
Total Iron Binding Capacity: 271 ug/dL (ref 250–450)
UIBC: 193 ug/dL (ref 131–425)

## 2019-07-26 LAB — TSH: TSH: 3.99 u[IU]/mL (ref 0.450–4.500)

## 2019-07-26 NOTE — Progress Notes (Unsigned)
Patient states she is scheduled for her mammogram 08/02/2019.

## 2019-08-02 ENCOUNTER — Ambulatory Visit
Admission: RE | Admit: 2019-08-02 | Discharge: 2019-08-02 | Disposition: A | Discharge status: Home / Self Care | Payer: BC Managed Care – PPO | Source: Ambulatory Visit | Attending: Internal Medicine | Admitting: Internal Medicine

## 2019-08-02 DIAGNOSIS — Z1231 Encounter for screening mammogram for malignant neoplasm of breast: Secondary | ICD-10-CM | POA: Insufficient documentation

## 2019-08-16 ENCOUNTER — Ambulatory Visit: Payer: Self-pay | Admitting: Internal Medicine

## 2019-09-18 ENCOUNTER — Other Ambulatory Visit: Payer: Self-pay

## 2019-09-18 ENCOUNTER — Ambulatory Visit: Payer: BC Managed Care – PPO | Admitting: Dermatology

## 2019-09-18 DIAGNOSIS — D485 Neoplasm of uncertain behavior of skin: Secondary | ICD-10-CM | POA: Diagnosis not present

## 2019-09-18 DIAGNOSIS — L570 Actinic keratosis: Secondary | ICD-10-CM

## 2019-09-18 DIAGNOSIS — Z85828 Personal history of other malignant neoplasm of skin: Secondary | ICD-10-CM | POA: Diagnosis not present

## 2019-09-18 DIAGNOSIS — M67441 Ganglion, right hand: Secondary | ICD-10-CM | POA: Diagnosis not present

## 2019-09-18 DIAGNOSIS — I781 Nevus, non-neoplastic: Secondary | ICD-10-CM

## 2019-09-18 NOTE — Patient Instructions (Addendum)

## 2019-09-18 NOTE — Progress Notes (Signed)
   Follow-Up Visit   Subjective  Bridget Reed is a 59 y.o. female who presents for the following: Follow-up.  Patient here today to follow up for [facility] at right chest treated with [facility] 06/17/2017. She also is following up for a spot that was treated with LN2 at the nose. Patient advises there is still a small spot at nose but has been stable. Spot at right middle finger present > 1 year. She was using salicylic acid but the spot gets red and angry, busts open and drains a clear substance and then gets inflamed again.  The following portions of the chart were reviewed this encounter and updated as appropriate:     Review of Systems:  No other skin or systemic complaints except as noted in HPI or Assessment and Plan.  Objective  Well appearing patient in no apparent distress; mood and affect are within normal limits.  A focused examination was performed including face, chest, right hand. Relevant physical exam findings are noted in the Assessment and Plan.  Objective  Nasal tip: Clear today  Objective  Right chest: Well healed scar with no evidence of recurrence.   Objective  Right 3rd Finger Distal Interphalangeal Joint: 60mm scaly firm papule  Objective  Nasal tip: Dilated vessels   Assessment & Plan  AK (actinic keratosis) Nasal tip  No evidence of recurrence, call clinic for new or changing lesions.   History of basal cell carcinoma (BCC) Right chest  No evidence of recurrence, call clinic for new or changing lesions.   Neoplasm of uncertain behavior of skin Right 3rd Finger Distal Interphalangeal Joint  Skin / nail biopsy Type of biopsy: tangential   Informed consent: discussed and consent obtained   Patient was prepped and draped in usual sterile fashion: Area prepped with alcohol. Anesthesia: the lesion was anesthetized in a standard fashion   Anesthetic:  1% lidocaine w/ epinephrine 1-100,000 buffered w/ 8.4% NaHCO3 Instrument used: flexible razor blade    Hemostasis achieved with: pressure, aluminum chloride and electrodesiccation   Outcome: patient tolerated procedure well   Post-procedure details: wound care instructions given   Post-procedure details comment:  Ointment and small bandage applied  Specimen 1 - Surgical pathology Differential Diagnosis: Viral Wart vs Digital Mucous Cyst Check Margins: No 25mm verrucous papule  Telangiectasia Nasal tip  Benign, observe.  Discussed BBL, noncovered laser procedure  Return in about 6 months (around 03/19/2020) for TBSE.  Graciella Belton, RMA, am acting as scribe for Brendolyn Patty, MD .  Documentation: I have reviewed the above documentation for accuracy and completeness, and I agree with the above.  Brendolyn Patty, MD

## 2019-09-21 ENCOUNTER — Telehealth: Payer: Self-pay

## 2019-09-21 NOTE — Telephone Encounter (Signed)
-----   Message from Brendolyn Patty, MD sent at 09/20/2019 10:02 PM EDT ----- Skin , right 3rd finger distal interphalangeal joint DIGITAL MUCOUS CYST  Benign cyst, may recur.

## 2019-09-21 NOTE — Telephone Encounter (Signed)
Advised pt of bx results/sh ?

## 2019-10-19 ENCOUNTER — Other Ambulatory Visit: Payer: Self-pay

## 2019-10-19 ENCOUNTER — Ambulatory Visit: Payer: BC Managed Care – PPO | Admitting: Medical

## 2019-10-19 ENCOUNTER — Encounter: Payer: Self-pay | Admitting: Medical

## 2019-10-19 VITALS — BP 157/72 | HR 96 | Temp 97.5°F | Resp 18 | Ht 62.0 in | Wt 243.0 lb

## 2019-10-19 DIAGNOSIS — R300 Dysuria: Secondary | ICD-10-CM

## 2019-10-19 DIAGNOSIS — N3001 Acute cystitis with hematuria: Secondary | ICD-10-CM

## 2019-10-19 DIAGNOSIS — R609 Edema, unspecified: Secondary | ICD-10-CM

## 2019-10-19 LAB — POCT URINALYSIS DIPSTICK
Bilirubin, UA: NEGATIVE
Glucose, UA: NEGATIVE
Ketones, UA: NEGATIVE
Nitrite, UA: NEGATIVE
Protein, UA: NEGATIVE
Spec Grav, UA: 1.01 (ref 1.010–1.025)
Urobilinogen, UA: 0.2 E.U./dL
pH, UA: 6 (ref 5.0–8.0)

## 2019-10-19 MED ORDER — NITROFURANTOIN MONOHYD MACRO 100 MG PO CAPS: 100.0000 mg | ORAL_CAPSULE | Freq: Two times a day (BID) | ORAL | 0 refills | Status: DC

## 2019-10-19 MED ORDER — FUROSEMIDE 20 MG PO TABS: 20.0000 mg | ORAL_TABLET | Freq: Every day | ORAL | 0 refills | Status: DC | PRN

## 2019-10-19 NOTE — Progress Notes (Signed)
   Subjective:    Patient ID: Bridget Reed, female    DOB: 11-30-1960, 59 y.o.   MRN: [id number]  HPI  59 yo female in non acute distress.  Symptoms started yesterday which are  frequency, urgency , burning at terminal urination and bladder pressure. Denies fever , chill, N/V, or back pain. Last  UTI 10 years ago.  Blood pressure (!) 157/72, pulse 96, temperature (!) 97.5 F (36.4 C), resp. rate 18, height 5\' 2"  (1.575 m), weight 243 lb (110.2 kg), last menstrual period 11/22/2013, SpO2 98 %.   Review of Systems  Constitutional: Negative for chills and fever.  Gastrointestinal: Negative for diarrhea, nausea and vomiting.  Genitourinary: Positive for difficulty urinating (painful ath the end of urination), dysuria, frequency, hematuria and urgency.  Musculoskeletal: Negative for back pain.       Objective:   Physical Exam Constitutional:      Appearance: Normal appearance. She is obese.  HENT:     Head: Normocephalic and atraumatic.  Eyes:     Extraocular Movements: Extraocular movements intact.     Conjunctiva/sclera: Conjunctivae normal.     Pupils: Pupils are equal, round, and reactive to light.  Pulmonary:     Effort: Pulmonary effort is normal.  Abdominal:     Palpations: Abdomen is soft.     Tenderness: There is no abdominal tenderness. There is no right CVA tenderness or left CVA tenderness.     Comments: + pressure suprapubic area  Musculoskeletal:     Cervical back: Normal range of motion.  Skin:    General: Skin is warm and dry.  Neurological:     General: No focal deficit present.     Mental Status: She is alert and oriented to person, place, and time. Mental status is at baseline.  Psychiatric:        Mood and Affect: Mood normal.        Behavior: Behavior normal.        Thought Content: Thought content normal.        Judgment: Judgment normal.             Assessment & Plan:  Urinary tract infection with hematuria. Increase water intake. Take  OTC AZO per package instructions, no more then 2 days.  If continued pain follow up with your doctor, or this clinic. Since it is a holiday weekend the offices may be closed so you will need to follow up with an Urgent Care or the Emergency Department if you are worsening.  Meds ordered this encounter  Medications  . nitrofurantoin, macrocrystal-monohydrate, (MACROBID) 100 MG capsule    Sig: Take 1 capsule (100 mg total) by mouth 2 (two) times daily.    Dispense:  14 capsule    Refill:  0  Patient verbalizes information and has no questions at discharge.  Give  2 tablets of phyazopyridine in clinic.Return to clinic as needed. Patient verbalizes understanding and has no questions at discharge.  Sent UTI  Information to MyChart.  10/22/2019 Urine resulted at [facility]. E.Coli is the bacteria, she is taking Macrobid,  Which is the correct antibiotic, sent MyChart to patient.

## 2019-10-22 ENCOUNTER — Encounter: Payer: Self-pay | Admitting: Medical

## 2019-10-22 LAB — URINE CULTURE

## 2019-12-12 ENCOUNTER — Ambulatory Visit: Payer: BC Managed Care – PPO | Admitting: Podiatry

## 2020-01-03 ENCOUNTER — Ambulatory Visit
Admission: RE | Admit: 2020-01-03 | Discharge: 2020-01-03 | Disposition: A | Discharge status: Home / Self Care | Payer: BC Managed Care – PPO | Attending: Medical | Admitting: Medical

## 2020-01-03 ENCOUNTER — Ambulatory Visit
Admission: RE | Admit: 2020-01-03 | Discharge: 2020-01-03 | Disposition: A | Discharge status: Home / Self Care | Payer: BC Managed Care – PPO | Source: Ambulatory Visit | Attending: Medical | Admitting: Medical

## 2020-01-03 ENCOUNTER — Other Ambulatory Visit: Payer: Self-pay

## 2020-01-03 ENCOUNTER — Encounter: Payer: Self-pay | Admitting: Medical

## 2020-01-03 ENCOUNTER — Ambulatory Visit: Payer: BC Managed Care – PPO | Admitting: Medical

## 2020-01-03 VITALS — BP 166/89 | HR 90 | Temp 97.1°F | Resp 16 | Wt 246.6 lb

## 2020-01-03 DIAGNOSIS — R0781 Pleurodynia: Secondary | ICD-10-CM | POA: Diagnosis not present

## 2020-01-03 DIAGNOSIS — G8929 Other chronic pain: Secondary | ICD-10-CM | POA: Diagnosis not present

## 2020-01-03 DIAGNOSIS — M545 Low back pain, unspecified: Secondary | ICD-10-CM

## 2020-01-03 DIAGNOSIS — J029 Acute pharyngitis, unspecified: Secondary | ICD-10-CM

## 2020-01-03 DIAGNOSIS — R519 Headache, unspecified: Secondary | ICD-10-CM

## 2020-01-03 MED ORDER — PREDNISONE 10 MG (21) PO TBPK: ORAL_TABLET | ORAL | 0 refills | Status: DC

## 2020-01-03 MED ORDER — CYCLOBENZAPRINE HCL 5 MG PO TABS: ORAL_TABLET | ORAL | 0 refills | Status: DC

## 2020-01-03 MED ORDER — IBUPROFEN 800 MG PO TABS: 800.0000 mg | ORAL_TABLET | Freq: Three times a day (TID) | ORAL | 0 refills | Status: DC | PRN

## 2020-01-03 NOTE — Progress Notes (Addendum)
CLINICAL DATA:  Pain  EXAM: LEFT RIBS - 2 VIEW  COMPARISON:  None.  FINDINGS: Frontal and oblique views were obtained. No rib fracture evident. Left lung clear. Heart size normal. No pneumothorax or pleural effusion on the left.  IMPRESSION: No evident rib fracture.  Left lung clear.   Electronically Signed   By: Lowella Grip III M.D.   On: 01/04/2020 09:14  Results of x-ray WNL , start medication, if not improving return to the clinic in one week. Patient verbalizes understanding and has no questions at the end of our conversation.  9:30am. Subjective:    Patient ID: Bridget Reed, female    DOB: 09-28-1960, 59 y.o.   MRN: [id number]  HPI 59 yo female in non acute distress. Seen by Heron Sabins which help and it felt better x 3 weeks. Usually helps with OTC Aleve or Motrin , some relief.Has had this for years. No pain with deep inspiration, however when she twists her trunk the pain increases.Denies fever or chills, SOB or CP.   Blood pressure (!) 166/89, pulse 90, temperature (!) 97.1 F (36.2 C), temperature source Temporal, resp. rate 16, weight 246 lb 9.6 oz (111.9 kg), last menstrual period 11/22/2013, SpO2 97 %.  Allergies  Allergen Reactions  . Mobic [Meloxicam] Swelling    In hands and ankles and under eyes.  . Erythromycin     Rash, GI upset  . Erythromycin Base   . Influenza Vac Split Quad     Possible allergy- rash after injection  . Shrimp [Shellfish Allergy] Other (See Comments)    Headache when eaten in excess  . Sulfa Antibiotics     unknown  . Azithromycin Rash   State she can take Ibuprofen with out any allergic reaction  Review of Systems  Constitutional: Negative for chills, fatigue and fever.  HENT: Negative for congestion, ear pain and sore throat.   Respiratory: Positive for cough. Negative for choking, chest tightness, shortness of breath and wheezing.   Cardiovascular: Negative for chest pain.  Gastrointestinal: Negative for  abdominal distention, abdominal pain, anal bleeding, blood in stool, constipation, diarrhea, nausea and vomiting.  Genitourinary: Negative for difficulty urinating.  Musculoskeletal: Positive for back pain (mid thoracic, pain with getting up from sitting or lying posiion).  Skin: Negative for rash.  Allergic/Immunologic: Positive for environmental allergies and food allergies (shrimp). Negative for immunocompromised state.  Neurological: Negative.        Objective:   Physical Exam Vitals and nursing note reviewed.  Constitutional:      Appearance: Normal appearance. She is obese.  HENT:     Head: Normocephalic and atraumatic.  Eyes:     Extraocular Movements: Extraocular movements intact.     Conjunctiva/sclera: Conjunctivae normal.     Pupils: Pupils are equal, round, and reactive to light.  Cardiovascular:     Rate and Rhythm: Normal rate and regular rhythm.  Pulmonary:     Effort: Pulmonary effort is normal.     Breath sounds: Normal breath sounds.  Musculoskeletal:        General: Normal range of motion.     Cervical back: Normal range of motion.     Thoracic back: Normal. No swelling, edema, deformity, signs of trauma, lacerations, spasms, tenderness or bony tenderness. Normal range of motion.       Back:  Skin:    General: Skin is warm and dry.  Neurological:     General: No focal deficit present.     Mental Status: She  is alert and oriented to person, place, and time. Mental status is at baseline.  Psychiatric:        Mood and Affect: Mood normal.        Behavior: Behavior normal.        Thought Content: Thought content normal.        Judgment: Judgment normal.    Skin intact, no erythema, no rash, directly under bra band, not reproducible by palpation. Increased with twisting L>R       Assessment & Plan:  Chronic left mid back pain x 1 year, changed to constant with movement x 7 days.Ibuprofen gives some relief , she was only taking  200mg . Apply heat and ice  to the area, do not exercise for now. She was told by a Chiropractor that she had a rib out of place. Patient sent for x-ray Left lateral ribs. Meds ordered this encounter  Medications  . cyclobenzaprine (FLEXERIL) 5 MG tablet    Sig: Take 1-2 tablets by mouth every  8 hours as needed for muscle spasm.May cause sedation.    Dispense:  21 tablet    Refill:  0  . predniSONE (STERAPRED UNI-PAK 21 TAB) 10 MG (21) TBPK tablet    Sig: Take 3 tablets by mouth today, then  3 tablets tomorrow, then 2 tablets x 2 days and  1 tablet x 2 days. Take with food.    Dispense:  21 tablet    Refill:  0  . ibuprofen (ADVIL) 800 MG tablet    Sig: Take 1 tablet (800 mg total) by mouth every 8 (eight) hours as needed. Take with food.    Dispense:  30 tablet    Refill:  0  hold off on taking medication till I get the results from the x-ray. Patient verbalizes understanding and has no questions at discharge.

## 2020-01-04 ENCOUNTER — Encounter: Payer: Self-pay | Admitting: Medical

## 2020-01-04 ENCOUNTER — Ambulatory Visit: Payer: BC Managed Care – PPO | Admitting: Medical

## 2020-01-30 ENCOUNTER — Ambulatory Visit: Payer: BC Managed Care – PPO | Admitting: Internal Medicine

## 2020-02-12 ENCOUNTER — Other Ambulatory Visit: Payer: Self-pay

## 2020-02-12 DIAGNOSIS — I10 Essential (primary) hypertension: Secondary | ICD-10-CM

## 2020-02-12 MED ORDER — OLMESARTAN MEDOXOMIL-HCTZ 20-12.5 MG PO TABS: 1.0000 | ORAL_TABLET | Freq: Every day | ORAL | 0 refills | Status: DC

## 2020-02-14 ENCOUNTER — Encounter: Payer: Self-pay | Admitting: Internal Medicine

## 2020-02-14 ENCOUNTER — Ambulatory Visit: Payer: BC Managed Care – PPO | Admitting: Internal Medicine

## 2020-02-14 ENCOUNTER — Other Ambulatory Visit: Payer: Self-pay

## 2020-02-14 VITALS — BP 146/88 | HR 84 | Temp 98.2°F | Ht 62.0 in | Wt 247.2 lb

## 2020-02-14 DIAGNOSIS — Z1329 Encounter for screening for other suspected endocrine disorder: Secondary | ICD-10-CM

## 2020-02-14 DIAGNOSIS — K219 Gastro-esophageal reflux disease without esophagitis: Secondary | ICD-10-CM | POA: Diagnosis not present

## 2020-02-14 DIAGNOSIS — M545 Low back pain, unspecified: Secondary | ICD-10-CM

## 2020-02-14 DIAGNOSIS — I10 Essential (primary) hypertension: Secondary | ICD-10-CM

## 2020-02-14 DIAGNOSIS — R739 Hyperglycemia, unspecified: Secondary | ICD-10-CM | POA: Diagnosis not present

## 2020-02-14 DIAGNOSIS — Z6841 Body Mass Index (BMI) 40.0 and over, adult: Secondary | ICD-10-CM

## 2020-02-14 DIAGNOSIS — M549 Dorsalgia, unspecified: Secondary | ICD-10-CM

## 2020-02-14 MED ORDER — OMEPRAZOLE 40 MG PO CPDR: 40.0000 mg | DELAYED_RELEASE_CAPSULE | Freq: Every day | ORAL | 3 refills | Status: DC

## 2020-02-14 MED ORDER — OLMESARTAN MEDOXOMIL-HCTZ 20-12.5 MG PO TABS: 1.0000 | ORAL_TABLET | Freq: Every day | ORAL | 3 refills | Status: DC

## 2020-02-14 NOTE — Progress Notes (Addendum)
Chief Complaint  Patient presents with  . Follow-up   F/u  1. HTN did not take benicar 20-12.5 today also not taking lasix 20 mg qd BP elevated today  At home getting 136 on top at times  2. GERD needs refill omeprazole 40 mg qd  3. Left mid to low back pain given 800 mg Ibuprofen resolved did not take prednisone and took flexeril 5 mg per work [employer] and back pain now resolved   Review of Systems  Constitutional: Negative for weight loss.  HENT: Negative for hearing loss.   Eyes: Negative for blurred vision.  Respiratory: Negative for shortness of breath.   Cardiovascular: Negative for chest pain and leg swelling.  Musculoskeletal: Negative for back pain and falls.  Skin: Negative for rash.  Neurological: Negative for headaches.  Psychiatric/Behavioral: Negative for depression.   Past Medical History:  Diagnosis Date  . Allergy   . GERD (gastroesophageal reflux disease)   . Hypertension   . Left tennis elbow   . Morbid obesity ([place]) 07/13/2016   Past Surgical History:  Procedure Laterality Date  . COLONOSCOPY    . COLONOSCOPY WITH PROPOFOL N/A 10/14/2015   Procedure: COLONOSCOPY WITH PROPOFOL;  Surgeon: Lucilla Lame, MD;  Location: [facility];  Service: Endoscopy;  Laterality: N/A;   Family History  Problem Relation Age of Onset  . COPD Mother   . Heart failure Mother   . Pancreatic cancer Mother   . Cancer Mother        pancretic  . Osteopenia Mother   . Arthritis Mother   . Cancer Father        lung/kidney never smoker   . Kidney cancer Father   . Hypertension Father   . Cancer Paternal Aunt        colon and breast cancer in 2 aunts  . Breast cancer Paternal Aunt 39  . Breast cancer Cousin   . Cancer Cousin        breast cancer   . Hypertension Brother   . Atrial fibrillation Brother   . Heart disease Maternal Grandmother   . Cancer Maternal Grandfather        lukemia   Social History   Socioeconomic History  . Marital status:  Married    Spouse name: Not on file  . Number of children: Not on file  . Years of education: Not on file  . Highest education level: Not on file  Occupational History  . Not on file  Tobacco Use  . Smoking status: Never Smoker  . Smokeless tobacco: Never Used  Substance and Sexual Activity  . Alcohol use: Yes    Alcohol/week: 9.0 standard drinks    Types: 9 Glasses of wine per week  . Drug use: No  . Sexual activity: Yes  Other Topics Concern  . Not on file  Social History Narrative   Diet: moderate.. Working on improving .   Walks 3-4 times a week.    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 Difficulty of Paying Living Expenses: Not on file  Food Insecurity:   . Worried About Charity fundraiser in the Last Year: Not on file  . Ran Out of Food in the Last Year: Not on file  Transportation Needs:   . Lack of Transportation (Medical): Not on file  . Lack of Transportation (Non-Medical): Not on file  Physical Activity:   . Days of Exercise per Week: Not on file  . Minutes of Exercise per Session: Not on file  Stress:   . Feeling of Stress : Not on file  Social Connections:   . Frequency of Communication with Friends and Family: Not on file  . Frequency of Social Gatherings with Friends and Family: Not on file  . Attends Religious Services: Not on file  . Active Member of Clubs or Organizations: Not on file  . Attends Archivist Meetings: Not on file  . Marital Status: Not on file  Intimate Partner Violence:   . Fear of Current or Ex-Partner: Not on file  . Emotionally Abused: Not on file  . Physically Abused: Not on file  . Sexually Abused: Not on file   Current Meds  Medication Sig  . furosemide (LASIX) 20 MG tablet Take 1 tablet (20 mg total) by mouth daily as needed.  . loratadine (CLARITIN) 10 MG tablet Take 10 mg by mouth daily.  . montelukast (SINGULAIR) 10 MG tablet Take 1  tablet (10 mg total) by mouth at bedtime.  Marland Kitchen olmesartan-hydrochlorothiazide (BENICAR HCT) 20-12.5 MG tablet Take 1 tablet by mouth daily. In am  . omeprazole (PRILOSEC) 40 MG capsule Take 1 capsule (40 mg total) by mouth daily. In am 30 minutes before breakfast  . triamcinolone (NASACORT) 55 MCG/ACT AERO nasal inhaler Place 1 spray into the nose as needed.   . [DISCONTINUED] cyclobenzaprine (FLEXERIL) 5 MG tablet Take 1-2 tablets by mouth every  8 hours as needed for muscle spasm.May cause sedation.  . [DISCONTINUED] ibuprofen (ADVIL) 800 MG tablet Take 1 tablet (800 mg total) by mouth every 8 (eight) hours as needed. Take with food.  . [DISCONTINUED] olmesartan-hydrochlorothiazide (BENICAR HCT) 20-12.5 MG tablet Take 1 tablet by mouth daily. In am  . [DISCONTINUED] omeprazole (PRILOSEC) 40 MG capsule Take 1 capsule (40 mg total) by mouth daily. In am 30 minutes before breakfast   Allergies  Allergen Reactions  . Mobic [Meloxicam] Swelling    In hands and ankles and under eyes.  . Erythromycin     Rash, GI upset  . Erythromycin Base   . Influenza Vac Split Quad     Possible allergy- rash after injection  . Shrimp [Shellfish Allergy] Other (See Comments)    Headache when eaten in excess  . Sulfa Antibiotics     unknown  . Azithromycin Rash   No results found for this or any previous visit (from the past 2160 hour(s)). Objective  Body mass index is 45.21 kg/m. Wt Readings from Last 3 Encounters:  02/14/20 247 lb 3.2 oz (112.1 kg)  01/03/20 246 lb 9.6 oz (111.9 kg)  10/19/19 243 lb (110.2 kg)   Temp Readings from Last 3 Encounters:  02/14/20 98.2 F (36.8 C) (Oral)  01/03/20 (!) 97.1 F (36.2 C) (Temporal)  10/19/19 (!) 97.5 F (36.4 C)   BP Readings from Last 3 Encounters:  02/14/20 (!) 146/88  01/03/20 (!) 166/89  10/19/19 (!) 157/72   Pulse Readings from Last 3 Encounters:  02/14/20 84  01/03/20 90  10/19/19 96    Physical Exam Vitals and nursing note reviewed.   Constitutional:      Appearance: Normal appearance. She is well-developed and well-groomed. She is morbidly obese.  HENT:     Head: Normocephalic and atraumatic.  Eyes:     Conjunctiva/sclera: Conjunctivae normal.     Pupils: Pupils are equal, round, and reactive to light.  Cardiovascular:  Rate and Rhythm: Normal rate and regular rhythm.     Heart sounds: Normal heart sounds.  Pulmonary:     Effort: Pulmonary effort is normal.     Breath sounds: Normal breath sounds.  Abdominal:     General: Abdomen is flat. Bowel sounds are normal.     Tenderness: There is no abdominal tenderness.  Skin:    General: Skin is warm and dry.  Neurological:     General: No focal deficit present.     Mental Status: She is alert and oriented to person, place, and time. Mental status is at baseline.     Gait: Gait normal.  Psychiatric:        Attention and Perception: Attention and perception normal.        Mood and Affect: Mood and affect normal.        Speech: Speech normal.        Behavior: Behavior normal. Behavior is cooperative.        Thought Content: Thought content normal.        Cognition and Memory: Cognition and memory normal.        Judgment: Judgment normal.     Assessment  Plan  Essential hypertension - Plan: olmesartan-hydrochlorothiazide (BENICAR HCT) 20-12.5 MG tablet, Comprehensive metabolic panel, Lipid panel, CBC with Differential/Platelet, TSH Goal BP <130/<80  Gastroesophageal reflux disease - Plan: omeprazole (PRILOSEC) 40 MG capsule  Hyperglycemia - Plan: Hemoglobin A1c  Acute left-sided low back pain without sciatica Mid back pain on left side -if returns consider Xray mid and low back and PT  Morbid obesity with BMI of 45.0-49.9, adult (HCC)  rec healthy diet and exercise  HM Flu shot utd 02/14/20 Pfizer 2/2  Td due 10/2009 due will check with [facility] will check with [facility] or call back to schedule  Consider hep B vaccine  Declines Hep  C/HIV  rec healthy diet and exercise   Breast examtoday normal but redness 4-5 oclock right inner breast 2 cm area of prev sebacous cyst  Referred mammo [place] due7/15/2020 right breast sebaceous cyst not currently inflammed if continues refer surgery for removal 08/02/19 mammogram negative   Pap neg 07/18/19 neg neg hpv    Colonoscopy had 10/19/15 Dr. Allen Norris repeat in 5 years had int hemorrhoids   [facility] [place]3 [place] [facility] congratulated 03/26/20 sch  Never smoker congratulateddoes not like smoking never did   rec mvt + vitamin D3 2000 IU qd  Provider: Dr. Olivia Mackie McLean-Scocuzza-Internal Medicine

## 2020-02-14 NOTE — Patient Instructions (Addendum)
Consider shingrix vaccine 2 doses  -schedule nurse visit here  Tdap vaccine due   Message in 2 weeks blood pressure goal <130/<80 Large adult   Zoster Vaccine, Recombinant injection What is this medicine? ZOSTER VACCINE (ZOS ter vak SEEN) is used to prevent shingles in adults 58 years old and over. This vaccine is not used to treat shingles or nerve pain from shingles. This medicine may be used for other purposes; ask your health care provider or pharmacist if you have questions. COMMON BRAND NAME(S): [facility] What should I tell my health care provider before I take this medicine? They need to know if you have any of these conditions:  blood disorders or disease  cancer like leukemia or lymphoma  immune system problems or therapy  an unusual or allergic reaction to vaccines, other medications, foods, dyes, or preservatives  pregnant or trying to get pregnant  breast-feeding How should I use this medicine? This vaccine is for injection in a muscle. It is given by a health care professional. Talk to your pediatrician regarding the use of this medicine in children. This medicine is not approved for use in children. Overdosage: If you think you have taken too much of this medicine contact a poison control center or emergency room at once. NOTE: This medicine is only for you. Do not share this medicine with others. What if I miss a dose? Keep appointments for follow-up (booster) doses as directed. It is important not to miss your dose. Call your doctor or health care professional if you are unable to keep an appointment. What may interact with this medicine?  medicines that suppress your immune system  medicines to treat cancer  steroid medicines like prednisone or cortisone This list may not describe all possible interactions. Give your health care provider a list of all the medicines, herbs, non-prescription drugs, or dietary supplements you use. Also tell them if you smoke,  drink alcohol, or use illegal drugs. Some items may interact with your medicine. What should I watch for while using this medicine? Visit your doctor for regular check ups. This vaccine, like all vaccines, may not fully protect everyone. What side effects may I notice from receiving this medicine? Side effects that you should report to your doctor or health care professional as soon as possible:  allergic reactions like skin rash, itching or hives, swelling of the face, lips, or tongue  breathing problems Side effects that usually do not require medical attention (report these to your doctor or health care professional if they continue or are bothersome):  chills  headache  fever  nausea, vomiting  redness, warmth, pain, swelling or itching at site where injected  tiredness This list may not describe all possible side effects. Call your doctor for medical advice about side effects. You may report side effects to FDA at 1-800-FDA-1088. Where should I keep my medicine? This vaccine is only given in a clinic, pharmacy, doctor's office, or other health care setting and will not be stored at home. NOTE: This sheet is a summary. It may not cover all possible information. If you have questions about this medicine, talk to your doctor, pharmacist, or health care provider.  2020 Elsevier/Gold Standard (2016-12-21 13:20:30)  Low Back Sprain or Strain Rehab Ask your health care provider which exercises are safe for you. Do exercises exactly as told by your health care provider and adjust them as directed. It is normal to feel mild stretching, pulling, tightness, or discomfort as you do these exercises.  Stop right away if you feel sudden pain or your pain gets worse. Do not begin these exercises until told by your health care provider. Stretching and range-of-motion exercises These exercises warm up your muscles and joints and improve the movement and flexibility of your back. These exercises  also help to relieve pain, numbness, and tingling. Lumbar rotation  1. Lie on your back on a firm surface and bend your knees. 2. Straighten your arms out to your sides so each arm forms a 90-degree angle (right angle) with a side of your body. 3. Slowly move (rotate) both of your knees to one side of your body until you feel a stretch in your lower back (lumbar). Try not to let your shoulders lift off the floor. 4. Hold this position for __________ seconds. 5. Tense your abdominal muscles and slowly move your knees back to the starting position. 6. Repeat this exercise on the other side of your body. Repeat __________ times. Complete this exercise __________ times a day. Single knee to chest  1. Lie on your back on a firm surface with both legs straight. 2. Bend one of your knees. Use your hands to move your knee up toward your chest until you feel a gentle stretch in your lower back and buttock. ? Hold your leg in this position by holding on to the front of your knee. ? Keep your other leg as straight as possible. 3. Hold this position for __________ seconds. 4. Slowly return to the starting position. 5. Repeat with your other leg. Repeat __________ times. Complete this exercise __________ times a day. Prone extension on elbows  1. Lie on your abdomen on a firm surface (prone position). 2. Prop yourself up on your elbows. 3. Use your arms to help lift your chest up until you feel a gentle stretch in your abdomen and your lower back. ? This will place some of your body weight on your elbows. If this is uncomfortable, try stacking pillows under your chest. ? Your hips should stay down, against the surface that you are lying on. Keep your hip and back muscles relaxed. 4. Hold this position for __________ seconds. 5. Slowly relax your upper body and return to the starting position. Repeat __________ times. Complete this exercise __________ times a day. Strengthening exercises These  exercises build strength and endurance in your back. Endurance is the ability to use your muscles for a long time, even after they get tired. Pelvic tilt This exercise strengthens the muscles that lie deep in the abdomen. 1. Lie on your back on a firm surface. Bend your knees and keep your feet flat on the floor. 2. Tense your abdominal muscles. Tip your pelvis up toward the ceiling and flatten your lower back into the floor. ? To help with this exercise, you may place a small towel under your lower back and try to push your back into the towel. 3. Hold this position for __________ seconds. 4. Let your muscles relax completely before you repeat this exercise. Repeat __________ times. Complete this exercise __________ times a day. Alternating arm and leg raises  1. Get on your hands and knees on a firm surface. If you are on a hard floor, you may want to use padding, such as an exercise mat, to cushion your knees. 2. Line up your arms and legs. Your hands should be directly below your shoulders, and your knees should be directly below your hips. 3. Lift your left leg behind you. At the  same time, raise your right arm and straighten it in front of you. ? Do not lift your leg higher than your hip. ? Do not lift your arm higher than your shoulder. ? Keep your abdominal and back muscles tight. ? Keep your hips facing the ground. ? Do not arch your back. ? Keep your balance carefully, and do not hold your breath. 4. Hold this position for __________ seconds. 5. Slowly return to the starting position. 6. Repeat with your right leg and your left arm. Repeat __________ times. Complete this exercise __________ times a day. Abdominal set with straight leg raise  1. Lie on your back on a firm surface. 2. Bend one of your knees and keep your other leg straight. 3. Tense your abdominal muscles and lift your straight leg up, 4-6 inches (10-15 cm) off the ground. 4. Keep your abdominal muscles tight and  hold this position for __________ seconds. ? Do not hold your breath. ? Do not arch your back. Keep it flat against the ground. 5. Keep your abdominal muscles tense as you slowly lower your leg back to the starting position. 6. Repeat with your other leg. Repeat __________ times. Complete this exercise __________ times a day. Single leg lower with bent knees 1. Lie on your back on a firm surface. 2. Tense your abdominal muscles and lift your feet off the floor, one foot at a time, so your knees and hips are bent in 90-degree angles (right angles). ? Your knees should be over your hips and your lower legs should be parallel to the floor. 3. Keeping your abdominal muscles tense and your knee bent, slowly lower one of your legs so your toe touches the ground. 4. Lift your leg back up to return to the starting position. ? Do not hold your breath. ? Do not let your back arch. Keep your back flat against the ground. 5. Repeat with your other leg. Repeat __________ times. Complete this exercise __________ times a day. Posture and body mechanics Good posture and healthy body mechanics can help to relieve stress in your body's tissues and joints. Body mechanics refers to the movements and positions of your body while you do your daily activities. Posture is part of body mechanics. Good posture means:  Your spine is in its natural S-curve position (neutral).  Your shoulders are pulled back slightly.  Your head is not tipped forward. Follow these guidelines to improve your posture and body mechanics in your everyday activities. Standing   When standing, keep your spine neutral and your feet about hip width apart. Keep a slight bend in your knees. Your ears, shoulders, and hips should line up.  When you do a task in which you stand in one place for a long time, place one foot up on a stable object that is 2-4 inches (5-10 cm) high, such as a footstool. This helps keep your spine  neutral. Sitting   When sitting, keep your spine neutral and keep your feet flat on the floor. Use a footrest, if necessary, and keep your thighs parallel to the floor. Avoid rounding your shoulders, and avoid tilting your head forward.  When working at a desk or a computer, keep your desk at a height where your hands are slightly lower than your elbows. Slide your chair under your desk so you are close enough to maintain good posture.  When working at a computer, place your monitor at a height where you are looking straight ahead and you  do not have to tilt your head forward or downward to look at the screen. Resting  When lying down and resting, avoid positions that are most painful for you.  If you have pain with activities such as sitting, bending, stooping, or squatting, lie in a position in which your body does not bend very much. For example, avoid curling up on your side with your arms and knees near your chest (fetal position).  If you have pain with activities such as standing for a long time or reaching with your arms, lie with your spine in a neutral position and bend your knees slightly. Try the following positions: ? Lying on your side with a pillow between your knees. ? Lying on your back with a pillow under your knees. Lifting   When lifting objects, keep your feet at least shoulder width apart and tighten your abdominal muscles.  Bend your knees and hips and keep your spine neutral. It is important to lift using the strength of your legs, not your back. Do not lock your knees straight out.  Always ask for help to lift heavy or awkward objects. This information is not intended to replace advice given to you by your health care provider. Make sure you discuss any questions you have with your health care provider. Document Revised: 09/02/2018 Document Reviewed: 06/02/2018 Elsevier Patient Education  [place].  Back Exercises The following exercises strengthen  the muscles that help to support the trunk and back. They also help to keep the lower back flexible. Doing these exercises can help to prevent back pain or lessen existing pain.  If you have back pain or discomfort, try doing these exercises 2-3 times each day or as told by your health care provider.  As your pain improves, do them once each day, but increase the number of times that you repeat the steps for each exercise (do more repetitions).  To prevent the recurrence of back pain, continue to do these exercises once each day or as told by your health care provider. Do exercises exactly as told by your health care provider and adjust them as directed. It is normal to feel mild stretching, pulling, tightness, or discomfort as you do these exercises, but you should stop right away if you feel sudden pain or your pain gets worse. Exercises Single knee to chest Repeat these steps 3-5 times for each leg: 1. Lie on your back on a firm bed or the floor with your legs extended. 2. Bring one knee to your chest. Your other leg should stay extended and in contact with the floor. 3. Hold your knee in place by grabbing your knee or thigh with both hands and hold. 4. Pull on your knee until you feel a gentle stretch in your lower back or buttocks. 5. Hold the stretch for 10-30 seconds. 6. Slowly release and straighten your leg. Pelvic tilt Repeat these steps 5-10 times: 1. Lie on your back on a firm bed or the floor with your legs extended. 2. Bend your knees so they are pointing toward the ceiling and your feet are flat on the floor. 3. Tighten your lower abdominal muscles to press your lower back against the floor. This motion will tilt your pelvis so your tailbone points up toward the ceiling instead of pointing to your feet or the floor. 4. With gentle tension and even breathing, hold this position for 5-10 seconds. Cat-cow Repeat these steps until your lower back becomes more flexible: 1. Get  into  a hands-and-knees position on a firm surface. Keep your hands under your shoulders, and keep your knees under your hips. You may place padding under your knees for comfort. 2. Let your head hang down toward your chest. Contract your abdominal muscles and point your tailbone toward the floor so your lower back becomes rounded like the back of a cat. 3. Hold this position for 5 seconds. 4. Slowly lift your head, let your abdominal muscles relax and point your tailbone up toward the ceiling so your back forms a sagging arch like the back of a cow. 5. Hold this position for 5 seconds.  Press-ups Repeat these steps 5-10 times: 1. Lie on your abdomen (face-down) on the floor. 2. Place your palms near your head, about shoulder-width apart. 3. Keeping your back as relaxed as possible and keeping your hips on the floor, slowly straighten your arms to raise the top half of your body and lift your shoulders. Do not use your back muscles to raise your upper torso. You may adjust the placement of your hands to make yourself more comfortable. 4. Hold this position for 5 seconds while you keep your back relaxed. 5. Slowly return to lying flat on the floor.  Bridges Repeat these steps 10 times: 1. Lie on your back on a firm surface. 2. Bend your knees so they are pointing toward the ceiling and your feet are flat on the floor. Your arms should be flat at your sides, next to your body. 3. Tighten your buttocks muscles and lift your buttocks off the floor until your waist is at almost the same height as your knees. You should feel the muscles working in your buttocks and the back of your thighs. If you do not feel these muscles, slide your feet 1-2 inches farther away from your buttocks. 4. Hold this position for 3-5 seconds. 5. Slowly lower your hips to the starting position, and allow your buttocks muscles to relax completely. If this exercise is too easy, try doing it with your arms crossed over your  chest. Abdominal crunches Repeat these steps 5-10 times: 1. Lie on your back on a firm bed or the floor with your legs extended. 2. Bend your knees so they are pointing toward the ceiling and your feet are flat on the floor. 3. Cross your arms over your chest. 4. Tip your chin slightly toward your chest without bending your neck. 5. Tighten your abdominal muscles and slowly raise your trunk (torso) high enough to lift your shoulder blades a tiny bit off the floor. Avoid raising your torso higher than that because it can put too much stress on your low back and does not help to strengthen your abdominal muscles. 6. Slowly return to your starting position. Back lifts Repeat these steps 5-10 times: 1. Lie on your abdomen (face-down) with your arms at your sides, and rest your forehead on the floor. 2. Tighten the muscles in your legs and your buttocks. 3. Slowly lift your chest off the floor while you keep your hips pressed to the floor. Keep the back of your head in line with the curve in your back. Your eyes should be looking at the floor. 4. Hold this position for 3-5 seconds. 5. Slowly return to your starting position. Contact a health care provider if:  Your back pain or discomfort gets much worse when you do an exercise.  Your worsening back pain or discomfort does not lessen within 2 hours after you exercise. If  you have any of these problems, stop doing these exercises right away. Do not do them again unless your health care provider says that you can. Get help right away if:  You develop sudden, severe back pain. If this happens, stop doing the exercises right away. Do not do them again unless your health care provider says that you can. This information is not intended to replace advice given to you by your health care provider. Make sure you discuss any questions you have with your health care provider. Document Revised: 09/15/2018 Document Reviewed: 02/10/2018 Elsevier Patient  Education  [place].

## 2020-02-15 DIAGNOSIS — M549 Dorsalgia, unspecified: Secondary | ICD-10-CM | POA: Insufficient documentation

## 2020-02-15 DIAGNOSIS — M545 Low back pain, unspecified: Secondary | ICD-10-CM | POA: Insufficient documentation

## 2020-02-25 ENCOUNTER — Encounter: Payer: Self-pay | Admitting: Internal Medicine

## 2020-02-26 ENCOUNTER — Encounter: Payer: Self-pay | Admitting: Medical

## 2020-02-26 ENCOUNTER — Other Ambulatory Visit: Payer: Self-pay

## 2020-02-26 ENCOUNTER — Ambulatory Visit: Payer: BC Managed Care – PPO | Admitting: Medical

## 2020-02-26 ENCOUNTER — Other Ambulatory Visit: Payer: Self-pay | Admitting: Internal Medicine

## 2020-02-26 VITALS — BP 192/68 | HR 88 | Temp 96.9°F | Ht 62.0 in | Wt 244.8 lb

## 2020-02-26 DIAGNOSIS — R030 Elevated blood-pressure reading, without diagnosis of hypertension: Secondary | ICD-10-CM

## 2020-02-26 DIAGNOSIS — Z8679 Personal history of other diseases of the circulatory system: Secondary | ICD-10-CM

## 2020-02-26 DIAGNOSIS — I10 Essential (primary) hypertension: Secondary | ICD-10-CM

## 2020-02-26 MED ORDER — LISINOPRIL-HYDROCHLOROTHIAZIDE 20-12.5 MG PO TABS: 1.0000 | ORAL_TABLET | Freq: Every day | ORAL | 3 refills | Status: DC

## 2020-02-26 MED ORDER — AMLODIPINE BESYLATE 2.5 MG PO TABS: 2.5000 mg | ORAL_TABLET | Freq: Every day | ORAL | 3 refills | Status: DC

## 2020-02-26 NOTE — Telephone Encounter (Signed)
Pt called in and asked to speak to a nurse about BP. She states that it has been high all weekend and she is having headaches and dizziness. Transferred to Cecille Rubin at [facility].

## 2020-02-26 NOTE — Progress Notes (Signed)
Subjective:    Patient ID: Bridget Reed, female    DOB: 01-11-1961, 59 y.o.   MRN: [id number]  HPI 59 yo female in non acute distress. Yesterday symptoms with dizziness, headache ( slight), no nausea.No SOB or CP. Took Lisinopril 20 mg at 11 am   And then another at  4pm 171/72. Felt better, dizziness resolved.  Had a cough daily with the Lisinopril/ HCTZ.So blood pressure medication changed to Benicar. She felt it was not working blood pressure at home running 180/ 68 at home.   Changed  months ago started the Benicar. My BP has   Today took the Lisinopril/HCTZ 20 mg /12.5.  [facility] Dr. Terese Door.  Blood pressure (!) 192/68, pulse 88, temperature (!) 96.9 F (36.1 C), temperature source Temporal, height 5\' 2"  (1.575 m), weight 244 lb 12.8 oz (111 kg), last menstrual period 11/22/2013, SpO2 98 %.  Allergies  Allergen Reactions  . Mobic [Meloxicam] Swelling    In hands and ankles and under eyes.  . Erythromycin     Rash, GI upset  . Erythromycin Base   . Influenza Vac Split Quad     Possible allergy- rash after injection  . Shrimp [Shellfish Allergy] Other (See Comments)    Headache when eaten in excess  . Sulfa Antibiotics     unknown  . Azithromycin Rash       Review of Systems  Constitutional: Negative for chills and fever.  Respiratory: Negative for shortness of breath and wheezing.   Cardiovascular: Negative for chest pain.  Gastrointestinal: Negative for diarrhea, nausea and vomiting.  Neurological: Positive for dizziness ("alittle") and headaches (slight).       Objective:   Physical Exam Vitals and nursing note reviewed.  Constitutional:      Appearance: Normal appearance. She is obese.  HENT:     Head: Normocephalic and atraumatic.  Eyes:     Extraocular Movements: Extraocular movements intact.     Conjunctiva/sclera: Conjunctivae normal.     Pupils: Pupils are equal, round, and reactive to light.  Cardiovascular:      Rate and Rhythm: Normal rate and regular rhythm.  Pulmonary:     Effort: Pulmonary effort is normal.     Breath sounds: Normal breath sounds.  Musculoskeletal:        General: Normal range of motion.     Cervical back: Normal range of motion and neck supple.  Skin:    General: Skin is warm and dry.     Capillary Refill: Capillary refill takes less than 2 seconds.  Neurological:     General: No focal deficit present.     Mental Status: She is alert and oriented to person, place, and time. Mental status is at baseline.  Psychiatric:        Mood and Affect: Mood normal.        Behavior: Behavior normal.        Thought Content: Thought content normal.        Judgment: Judgment normal.           Assessment & Plan:  Hypertension Elevated Dizziness/ Headache.  Had patient call her doctors office over weekend but no one return her call. She stopped the Benicar (this has HCTZ) and restarted Lisinopril without HCTZ then the next day she took Lisinopril/HCTZ 20/12.5 Per Dr. Olivia Mackie( patients PCP) she may take  1-2 tablets of the Lisinopril/HCTZ and MD also added in Norvasc 2.5 if BP is still elevated. She has an  appointment on 10/12 and Mariza has lab work , ordered by her PCP in which she has an appointment at [facility] to complete  Fasting lab orders. Reviewed S/S of severe hypertension and if these occur, CP, SOB, trouble moving arm or leg on one side to seek out medical care at an ED. Note: had conversation with patient MD -Dr. Olivia Mackie through [place].

## 2020-02-27 ENCOUNTER — Telehealth: Payer: Self-pay | Admitting: Medical

## 2020-02-27 ENCOUNTER — Ambulatory Visit: Payer: BC Managed Care – PPO | Admitting: Nurse Practitioner

## 2020-02-27 ENCOUNTER — Encounter: Payer: Self-pay | Admitting: Medical

## 2020-02-27 NOTE — Telephone Encounter (Signed)
Spoke with Bridget Reed, on how to take her blood pressure pills of Lisinopril/HCTZ and Norvasc. S/S of elevated blood pressure, and to keep a check on her blood pressure at home. [name] has an appointment with her  PCP on 03/05/20. She is scheduled at [facility] for fasting blood work, (labs entered by Dr. Olivia Mackie) on Friday 03/01/20. If patient has any concerns to talk to Dr. Olivia Mackie or me. Patient verbalizes understanding and has no questions at the end of our conversation.

## 2020-03-01 ENCOUNTER — Other Ambulatory Visit: Payer: BC Managed Care – PPO

## 2020-03-01 ENCOUNTER — Other Ambulatory Visit: Payer: Self-pay

## 2020-03-01 DIAGNOSIS — D751 Secondary polycythemia: Secondary | ICD-10-CM

## 2020-03-01 DIAGNOSIS — R7989 Other specified abnormal findings of blood chemistry: Secondary | ICD-10-CM

## 2020-03-01 DIAGNOSIS — Z1329 Encounter for screening for other suspected endocrine disorder: Secondary | ICD-10-CM

## 2020-03-01 DIAGNOSIS — R739 Hyperglycemia, unspecified: Secondary | ICD-10-CM

## 2020-03-01 DIAGNOSIS — I10 Essential (primary) hypertension: Secondary | ICD-10-CM

## 2020-03-02 LAB — IRON,TIBC AND FERRITIN PANEL
Ferritin: 292 ng/mL — ABNORMAL HIGH (ref 15–150)
Iron Saturation: 23 % (ref 15–55)
Iron: 71 ug/dL (ref 27–159)
Total Iron Binding Capacity: 304 ug/dL (ref 250–450)
UIBC: 233 ug/dL (ref 131–425)

## 2020-03-02 LAB — CBC WITH DIFFERENTIAL/PLATELET
Basophils Absolute: 0.1 10*3/uL (ref 0.0–0.2)
Basos: 1 %
EOS (ABSOLUTE): 0.2 10*3/uL (ref 0.0–0.4)
Eos: 2 %
Hematocrit: 45.8 % (ref 34.0–46.6)
Hemoglobin: 15.5 g/dL (ref 11.1–15.9)
Immature Grans (Abs): 0 10*3/uL (ref 0.0–0.1)
Immature Granulocytes: 0 %
Lymphocytes Absolute: 2 10*3/uL (ref 0.7–3.1)
Lymphs: 20 %
MCH: 30.3 pg (ref 26.6–33.0)
MCHC: 33.8 g/dL (ref 31.5–35.7)
MCV: 90 fL (ref 79–97)
Monocytes Absolute: 1.1 10*3/uL — ABNORMAL HIGH (ref 0.1–0.9)
Monocytes: 11 %
Neutrophils Absolute: 6.6 10*3/uL (ref 1.4–7.0)
Neutrophils: 66 %
Platelets: 274 10*3/uL (ref 150–450)
RBC: 5.12 x10E6/uL (ref 3.77–5.28)
RDW: 13.3 % (ref 11.7–15.4)
WBC: 10 10*3/uL (ref 3.4–10.8)

## 2020-03-02 LAB — COMPREHENSIVE METABOLIC PANEL
ALT: 27 IU/L (ref 0–32)
AST: 18 IU/L (ref 0–40)
Albumin/Globulin Ratio: 2.4 — ABNORMAL HIGH (ref 1.2–2.2)
Albumin: 4.6 g/dL (ref 3.8–4.9)
Alkaline Phosphatase: 95 IU/L (ref 44–121)
BUN/Creatinine Ratio: 21 (ref 9–23)
BUN: 12 mg/dL (ref 6–24)
Bilirubin Total: 0.7 mg/dL (ref 0.0–1.2)
CO2: 21 mmol/L (ref 20–29)
Calcium: 9.8 mg/dL (ref 8.7–10.2)
Chloride: 96 mmol/L (ref 96–106)
Creatinine, Ser: 0.57 mg/dL (ref 0.57–1.00)
GFR calc Af Amer: 118 mL/min/{1.73_m2} (ref >59)
GFR calc non Af Amer: 103 mL/min/{1.73_m2} (ref >59)
Globulin, Total: 1.9 g/dL (ref 1.5–4.5)
Glucose: 103 mg/dL — ABNORMAL HIGH (ref 65–99)
Potassium: 4.3 mmol/L (ref 3.5–5.2)
Sodium: 136 mmol/L (ref 134–144)
Total Protein: 6.5 g/dL (ref 6.0–8.5)

## 2020-03-02 LAB — LIPID PANEL
Chol/HDL Ratio: 3.6 ratio (ref 0.0–4.4)
Cholesterol, Total: 169 mg/dL (ref 100–199)
HDL: 47 mg/dL (ref >39)
LDL Chol Calc (NIH): 99 mg/dL (ref 0–99)
Triglycerides: 127 mg/dL (ref 0–149)
VLDL Cholesterol Cal: 23 mg/dL (ref 5–40)

## 2020-03-02 LAB — TSH: TSH: 3.59 u[IU]/mL (ref 0.450–4.500)

## 2020-03-02 LAB — HGB A1C W/O EAG: Hgb A1c MFr Bld: 5.5 % (ref 4.8–5.6)

## 2020-03-05 ENCOUNTER — Other Ambulatory Visit: Payer: Self-pay

## 2020-03-05 ENCOUNTER — Encounter: Payer: Self-pay | Admitting: Internal Medicine

## 2020-03-05 ENCOUNTER — Ambulatory Visit: Payer: BC Managed Care – PPO | Admitting: Internal Medicine

## 2020-03-05 VITALS — BP 136/78 | HR 89 | Temp 98.4°F | Ht 62.0 in | Wt 243.4 lb

## 2020-03-05 DIAGNOSIS — Z6841 Body Mass Index (BMI) 40.0 and over, adult: Secondary | ICD-10-CM

## 2020-03-05 DIAGNOSIS — I10 Essential (primary) hypertension: Secondary | ICD-10-CM | POA: Diagnosis not present

## 2020-03-05 NOTE — Patient Instructions (Signed)
DASH Eating Plan DASH stands for "Dietary Approaches to Stop Hypertension." The DASH eating plan is a healthy eating plan that has been shown to reduce high blood pressure (hypertension). It may also reduce your risk for type 2 diabetes, heart disease, and stroke. The DASH eating plan may also help with weight loss. What are tips for following this plan?  General guidelines  Avoid eating more than 2,300 mg (milligrams) of salt (sodium) a day. If you have hypertension, you may need to reduce your sodium intake to 1,500 mg a day.  Limit alcohol intake to no more than 1 drink a day for nonpregnant women and 2 drinks a day for men. One drink equals 12 oz of beer, 5 oz of wine, or 1 oz of hard liquor.  Work with your health care provider to maintain a healthy body weight or to lose weight. Ask what an ideal weight is for you.  Get at least 30 minutes of exercise that causes your heart to beat faster (aerobic exercise) most days of the week. Activities may include walking, swimming, or biking.  Work with your health care provider or diet and nutrition specialist (dietitian) to adjust your eating plan to your individual calorie needs. Reading food labels   Check food labels for the amount of sodium per serving. Choose foods with less than 5 percent of the Daily Value of sodium. Generally, foods with less than 300 mg of sodium per serving fit into this eating plan.  To find whole grains, look for the word "whole" as the first word in the ingredient list. Shopping  Buy products labeled as "low-sodium" or "no salt added."  Buy fresh foods. Avoid canned foods and premade or frozen meals. Cooking  Avoid adding salt when cooking. Use salt-free seasonings or herbs instead of table salt or sea salt. Check with your health care provider or pharmacist before using salt substitutes.  Do not fry foods. Cook foods using healthy methods such as baking, boiling, grilling, and broiling instead.  Cook with  heart-healthy oils, such as olive, canola, soybean, or sunflower oil. Meal planning  Eat a balanced diet that includes: ? 5 or more servings of fruits and vegetables each day. At each meal, try to fill half of your plate with fruits and vegetables. ? Up to 6-8 servings of whole grains each day. ? Less than 6 oz of lean meat, poultry, or fish each day. A 3-oz serving of meat is about the same size as a deck of cards. One egg equals 1 oz. ? 2 servings of low-fat dairy each day. ? A serving of nuts, seeds, or beans 5 times each week. ? Heart-healthy fats. Healthy fats called Omega-3 fatty acids are found in foods such as flaxseeds and coldwater fish, like sardines, salmon, and mackerel.  Limit how much you eat of the following: ? Canned or prepackaged foods. ? Food that is high in trans fat, such as fried foods. ? Food that is high in saturated fat, such as fatty meat. ? Sweets, desserts, sugary drinks, and other foods with added sugar. ? Full-fat dairy products.  Do not salt foods before eating.  Try to eat at least 2 vegetarian meals each week.  Eat more home-cooked food and less restaurant, buffet, and fast food.  When eating at a restaurant, ask that your food be prepared with less salt or no salt, if possible. What foods are recommended? The items listed may not be a complete list. Talk with your dietitian about   what dietary choices are best for you. Grains Whole-grain or whole-wheat bread. Whole-grain or whole-wheat pasta. Brown rice. Oatmeal. Quinoa. Bulgur. Whole-grain and low-sodium cereals. Pita bread. Low-fat, low-sodium crackers. Whole-wheat flour tortillas. Vegetables Fresh or frozen vegetables (raw, steamed, roasted, or grilled). Low-sodium or reduced-sodium tomato and vegetable juice. Low-sodium or reduced-sodium tomato sauce and tomato paste. Low-sodium or reduced-sodium canned vegetables. Fruits All fresh, dried, or frozen fruit. Canned fruit in natural juice (without  added sugar). Meat and other protein foods Skinless chicken or turkey. Ground chicken or turkey. Pork with fat trimmed off. Fish and seafood. Egg whites. Dried beans, peas, or lentils. Unsalted nuts, nut butters, and seeds. Unsalted canned beans. Lean cuts of beef with fat trimmed off. Low-sodium, lean deli meat. Dairy Low-fat (1%) or fat-free (skim) milk. Fat-free, low-fat, or reduced-fat cheeses. Nonfat, low-sodium ricotta or cottage cheese. Low-fat or nonfat yogurt. Low-fat, low-sodium cheese. Fats and oils Soft margarine without trans fats. Vegetable oil. Low-fat, reduced-fat, or light mayonnaise and salad dressings (reduced-sodium). Canola, safflower, olive, soybean, and sunflower oils. Avocado. Seasoning and other foods Herbs. Spices. Seasoning mixes without salt. Unsalted popcorn and pretzels. Fat-free sweets. What foods are not recommended? The items listed may not be a complete list. Talk with your dietitian about what dietary choices are best for you. Grains Baked goods made with fat, such as croissants, muffins, or some breads. Dry pasta or rice meal packs. Vegetables Creamed or fried vegetables. Vegetables in a cheese sauce. Regular canned vegetables (not low-sodium or reduced-sodium). Regular canned tomato sauce and paste (not low-sodium or reduced-sodium). Regular tomato and vegetable juice (not low-sodium or reduced-sodium). Pickles. Olives. Fruits Canned fruit in a light or heavy syrup. Fried fruit. Fruit in cream or butter sauce. Meat and other protein foods Fatty cuts of meat. Ribs. Fried meat. Bacon. Sausage. Bologna and other processed lunch meats. Salami. Fatback. Hotdogs. Bratwurst. Salted nuts and seeds. Canned beans with added salt. Canned or smoked fish. Whole eggs or egg yolks. Chicken or turkey with skin. Dairy Whole or 2% milk, cream, and half-and-half. Whole or full-fat cream cheese. Whole-fat or sweetened yogurt. Full-fat cheese. Nondairy creamers. Whipped toppings.  Processed cheese and cheese spreads. Fats and oils Butter. Stick margarine. Lard. Shortening. Ghee. Bacon fat. Tropical oils, such as coconut, palm kernel, or palm oil. Seasoning and other foods Salted popcorn and pretzels. Onion salt, garlic salt, seasoned salt, table salt, and sea salt. Worcestershire sauce. Tartar sauce. Barbecue sauce. Teriyaki sauce. Soy sauce, including reduced-sodium. Steak sauce. Canned and packaged gravies. Fish sauce. Oyster sauce. Cocktail sauce. Horseradish that you find on the shelf. Ketchup. Mustard. Meat flavorings and tenderizers. Bouillon cubes. Hot sauce and Tabasco sauce. Premade or packaged marinades. Premade or packaged taco seasonings. Relishes. Regular salad dressings. Where to find more information:  National Heart, Lung, and Blood Institute: www.nhlbi.nih.gov  American Heart Association: www.heart.org Summary  The DASH eating plan is a healthy eating plan that has been shown to reduce high blood pressure (hypertension). It may also reduce your risk for type 2 diabetes, heart disease, and stroke.  With the DASH eating plan, you should limit salt (sodium) intake to 2,300 mg a day. If you have hypertension, you may need to reduce your sodium intake to 1,500 mg a day.  When on the DASH eating plan, aim to eat more fresh fruits and vegetables, whole grains, lean proteins, low-fat dairy, and heart-healthy fats.  Work with your health care provider or diet and nutrition specialist (dietitian) to adjust your eating plan to your   individual calorie needs. This information is not intended to replace advice given to you by your health care provider. Make sure you discuss any questions you have with your health care provider. Document Revised: 04/23/2017 Document Reviewed: 05/04/2016 Elsevier Patient Education  2020 Elsevier Inc.  

## 2020-03-05 NOTE — Progress Notes (Signed)
Chief Complaint  Patient presents with  . Follow-up  . Hypertension   F/u htn  1. htn on norvasc 2.5-5 mg qd and lis-hctz 20-12.5 mg 2 pills daily with improved BP was 180s-190s/90s and improved this am 162/82 near doses of meds  She is walking 9K to 11K steps 5x per week at work  Dizziness and h/a have resolved which were sx's of htn    Review of Systems  Constitutional: Negative for weight loss.  Eyes: Negative for blurred vision.  Respiratory: Negative for shortness of breath.   Cardiovascular: Negative for chest pain.  Musculoskeletal: Positive for back pain.  Skin: Negative for rash.  Neurological: Negative for dizziness and headaches.   Past Medical History:  Diagnosis Date  . Allergy   . GERD (gastroesophageal reflux disease)   . Hypertension   . Left tennis elbow   . Morbid obesity ([place]) 07/13/2016   Past Surgical History:  Procedure Laterality Date  . COLONOSCOPY    . COLONOSCOPY WITH PROPOFOL N/A 10/14/2015   Procedure: COLONOSCOPY WITH PROPOFOL;  Surgeon: Lucilla Lame, MD;  Location: [facility];  Service: Endoscopy;  Laterality: N/A;   Family History  Problem Relation Age of Onset  . COPD Mother   . Heart failure Mother   . Pancreatic cancer Mother   . Cancer Mother        pancretic  . Osteopenia Mother   . Arthritis Mother   . Cancer Father        lung/kidney never smoker   . Kidney cancer Father   . Hypertension Father   . Cancer Paternal Aunt        colon and breast cancer in 2 aunts  . Breast cancer Paternal Aunt 44  . Breast cancer Cousin   . Cancer Cousin        breast cancer   . Hypertension Brother   . Atrial fibrillation Brother   . Heart disease Maternal Grandmother   . Cancer Maternal Grandfather        lukemia   Social History   Socioeconomic History  . Marital status: Married    Spouse name: Not on file  . Number of children: Not on file  . Years of education: Not on file  . Highest education level: Not on file   Occupational History  . Not on file  Tobacco Use  . Smoking status: Never Smoker  . Smokeless tobacco: Never Used  Substance and Sexual Activity  . Alcohol use: Yes    Alcohol/week: 9.0 standard drinks    Types: 9 Glasses of wine per week  . Drug use: No  . Sexual activity: Yes  Other Topics Concern  . Not on file  Social History Narrative   Diet: moderate.. Working on improving .   Walks 3-4 times a week.    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 Difficulty of Paying Living Expenses: Not on file  Food Insecurity:   . Worried About Charity fundraiser in the Last Year: Not on file  . Ran Out of Food in the Last Year: Not on file  Transportation Needs:   . Lack of Transportation (Medical): Not on file  . Lack of Transportation (Non-Medical): Not on file  Physical Activity:   . Days of Exercise per Week: Not on file  . Minutes of Exercise per Session: Not on file  Stress:   .  Feeling of Stress : Not on file  Social Connections:   . Frequency of Communication with Friends and Family: Not on file  . Frequency of Social Gatherings with Friends and Family: Not on file  . Attends Religious Services: Not on file  . Active Member of Clubs or Organizations: Not on file  . Attends Archivist Meetings: Not on file  . Marital Status: Not on file  Intimate Partner Violence:   . Fear of Current or Ex-Partner: Not on file  . Emotionally Abused: Not on file  . Physically Abused: Not on file  . Sexually Abused: Not on file   Current Meds  Medication Sig  . amLODipine (NORVASC) 2.5 MG tablet Take 1 tablet (2.5 mg total) by mouth daily. For Blood pressure >130/>80  . furosemide (LASIX) 20 MG tablet Take 1 tablet (20 mg total) by mouth daily as needed.  Marland Kitchen lisinopril-hydrochlorothiazide (ZESTORETIC) 20-12.5 MG tablet Take 1 tablet by mouth daily. In am if BP >130/>80 02/27/20 take 2 pills in the am each  day  . loratadine (CLARITIN) 10 MG tablet Take 10 mg by mouth daily.  . montelukast (SINGULAIR) 10 MG tablet Take 1 tablet (10 mg total) by mouth at bedtime.  Marland Kitchen omeprazole (PRILOSEC) 40 MG capsule Take 1 capsule (40 mg total) by mouth daily. In am 30 minutes before breakfast  . triamcinolone (NASACORT) 55 MCG/ACT AERO nasal inhaler Place 1 spray into the nose as needed.    Allergies  Allergen Reactions  . Mobic [Meloxicam] Swelling    In hands and ankles and under eyes.  . Erythromycin     Rash, GI upset  . Erythromycin Base   . Influenza Vac Split Quad     Possible allergy- rash after injection  . Shrimp [Shellfish Allergy] Other (See Comments)    Headache when eaten in excess  . Sulfa Antibiotics     unknown  . Azithromycin Rash   Recent Results (from the past 2160 hour(s))  TSH     Status: None   Collection Time: 03/01/20  8:31 AM  Result Value Ref Range   TSH 3.590 0.450 - 4.500 uIU/mL  Hgb A1c w/o eAG     Status: None   Collection Time: 03/01/20  8:31 AM  Result Value Ref Range   Hgb A1c MFr Bld 5.5 4.8 - 5.6 %    Comment:          Prediabetes: 5.7 - 6.4          Diabetes: >6.4          Glycemic control for adults with diabetes: <7.0   CBC with Differential/Platelet     Status: Abnormal   Collection Time: 03/01/20  8:31 AM  Result Value Ref Range   WBC 10.0 3.4 - 10.8 x10E3/uL   RBC 5.12 3.77 - 5.28 x10E6/uL   Hemoglobin 15.5 11.1 - 15.9 g/dL   Hematocrit 45.8 34.0 - 46.6 %   MCV 90 79 - 97 fL   MCH 30.3 26.6 - 33.0 pg   MCHC 33.8 31 - 35 g/dL   RDW 13.3 11.7 - 15.4 %   Platelets 274 150 - 450 x10E3/uL   Neutrophils 66 Not Estab. %   Lymphs 20 Not Estab. %   Monocytes 11 Not Estab. %   Eos 2 Not Estab. %   Basos 1 Not Estab. %   Neutrophils Absolute 6.6 1 - 7 x10E3/uL   Lymphocytes Absolute 2.0 0 - 3 x10E3/uL   Monocytes  Absolute 1.1 (H) 0 - 0 x10E3/uL   EOS (ABSOLUTE) 0.2 0.0 - 0.4 x10E3/uL   Basophils Absolute 0.1 0 - 0 x10E3/uL   Immature Granulocytes 0  Not Estab. %   Immature Grans (Abs) 0.0 0.0 - 0.1 x10E3/uL  Lipid panel     Status: None   Collection Time: 03/01/20  8:31 AM  Result Value Ref Range   Cholesterol, Total 169 100 - 199 mg/dL   Triglycerides 127 0 - 149 mg/dL   HDL 47 >39 mg/dL   VLDL Cholesterol Cal 23 5 - 40 mg/dL   LDL Chol Calc (NIH) 99 0 - 99 mg/dL   Chol/HDL Ratio 3.6 0.0 - 4.4 ratio    Comment:                                   T. Chol/HDL Ratio                                             Men  Women                               1/2 Avg.Risk  3.4    3.3                                   Avg.Risk  5.0    4.4                                2X Avg.Risk  9.6    7.1                                3X Avg.Risk 23.4   11.0   Comprehensive metabolic panel     Status: Abnormal   Collection Time: 03/01/20  8:31 AM  Result Value Ref Range   Glucose 103 (H) 65 - 99 mg/dL   BUN 12 6 - 24 mg/dL   Creatinine, Ser 0.57 0.57 - 1.00 mg/dL   GFR calc non Af Amer 103 >59 mL/min/1.73   GFR calc Af Amer 118 >59 mL/min/1.73    Comment: **[facility] currently reports eGFR in compliance with the current**   recommendations of the [employer]. [facility] will   update reporting as new guidelines are published from the NKF-ASN   Task force.    BUN/Creatinine Ratio 21 9 - 23   Sodium 136 134 - 144 mmol/L   Potassium 4.3 3.5 - 5.2 mmol/L   Chloride 96 96 - 106 mmol/L   CO2 21 20 - 29 mmol/L   Calcium 9.8 8.7 - 10.2 mg/dL   Total Protein 6.5 6.0 - 8.5 g/dL   Albumin 4.6 3.8 - 4.9 g/dL   Globulin, Total 1.9 1.5 - 4.5 g/dL   Albumin/Globulin Ratio 2.4 (H) 1.2 - 2.2   Bilirubin Total 0.7 0.0 - 1.2 mg/dL   Alkaline Phosphatase 95 44 - 121 IU/L    Comment:               **Please note reference interval change**   AST 18 0 -  40 IU/L   ALT 27 0 - 32 IU/L  Iron, TIBC and Ferritin Panel     Status: Abnormal   Collection Time: 03/01/20  8:31 AM  Result Value Ref Range   Total Iron Binding Capacity 304 250 - 450 ug/dL   UIBC  233 131 - 425 ug/dL   Iron 71 27 - 159 ug/dL   Iron Saturation 23 15 - 55 %   Ferritin 292 (H) 15.0 - 150.0 ng/mL   Objective  Body mass index is 44.52 kg/m. Wt Readings from Last 3 Encounters:  03/05/20 243 lb 6.4 oz (110.4 kg)  02/26/20 244 lb 12.8 oz (111 kg)  02/14/20 247 lb 3.2 oz (112.1 kg)   Temp Readings from Last 3 Encounters:  03/05/20 98.4 F (36.9 C) (Oral)  02/26/20 (!) 96.9 F (36.1 C) (Temporal)  02/14/20 98.2 F (36.8 C) (Oral)   BP Readings from Last 3 Encounters:  03/05/20 136/78  02/26/20 (!) 192/68  02/14/20 (!) 146/88   Pulse Readings from Last 3 Encounters:  03/05/20 89  02/26/20 88  02/14/20 84    Physical Exam Vitals and nursing note reviewed.  Constitutional:      Appearance: Normal appearance. She is well-developed and well-groomed. She is morbidly obese.  HENT:     Head: Normocephalic and atraumatic.  Cardiovascular:     Rate and Rhythm: Normal rate and regular rhythm.     Heart sounds: Normal heart sounds. No murmur heard.   Pulmonary:     Effort: Pulmonary effort is normal.     Breath sounds: Normal breath sounds.  Skin:    General: Skin is warm and dry.  Neurological:     General: No focal deficit present.     Mental Status: She is alert and oriented to person, place, and time. Mental status is at baseline.     Gait: Gait normal.  Psychiatric:        Attention and Perception: Attention and perception normal.        Mood and Affect: Mood and affect normal.        Speech: Speech normal.        Behavior: Behavior normal. Behavior is cooperative.        Thought Content: Thought content normal.        Cognition and Memory: Cognition and memory normal.        Judgment: Judgment normal.     Assessment  Plan  Primary hypertension - Plan: Basic Metabolic Panel (BMET) in 2 weeks  BP improved lis-hctz 20-12.5 2 pills qd and norvasc 2.5 to 5 mg qd took 5 mg today  Morbid obesity with BMI of 40.0-44.9, adult (HCC)  Cont healthy diet  and exercise   HM Flu shot utd 02/14/20 Pfizer 2/2  Td due 10/2009 due will check with [facility] shingrix will check with [facility] call backto schedule  Consider hep B vaccine  Declines Hep C/HIV  rec healthy diet and exercise   Breast examtoday normal but redness 4-5 oclock right inner breast 2 cm area of prev sebacous cyst Referred mammo [place] due7/15/2020 right breast sebaceous cyst not currently inflammed if continues refer surgery for removal 08/02/19 mammogram negative   Pap neg 07/18/19 neg neg hpv    Colonoscopy had 10/19/15 Dr. Allen Norris repeat in 5 years had int hemorrhoids   [facility] [place] [place] [facility] congratulated 03/26/20 [facility]  Never smoker congratulateddoes not like smoking never did   rec mvt + vitamin D3 2000 IU qd  Provider: Dr. Olivia Mackie McLean-Scocuzza-Internal Medicine

## 2020-03-05 NOTE — Progress Notes (Signed)
Pre visit review using our clinic review tool, if applicable. No additional management support is needed unless otherwise documented below in the visit note. 

## 2020-03-06 ENCOUNTER — Telehealth: Payer: Self-pay | Admitting: Internal Medicine

## 2020-03-06 ENCOUNTER — Other Ambulatory Visit: Payer: Self-pay | Admitting: Medical

## 2020-03-06 NOTE — Progress Notes (Signed)
Sch shingrix when pt ready x 2 doses 2nd in 2 to <6 months

## 2020-03-06 NOTE — Telephone Encounter (Signed)
-----   Message from Talmage Nap, [place] sent at 03/06/2020  8:52 AM EDT ----- Regarding: Bridget Reed  Hi Dr. Olivia Mackie, Jul[facility]ier We do not give Shingrix vaccine in this clinic. Will do BMet in 2 weeks. Sincerely Daryll Drown PA-C  ----- Message ----- From: McLean-Scocuzza, Nino Glow, MD Sent: 03/05/2020   1:44 PM EDT To: Estill Dooms Ratcliffe, PA-C  BP improved see note  Can you do bmet in 2 weeks  Thanks

## 2020-03-06 NOTE — Progress Notes (Signed)
Orders in per her MD, Dr. Olivia Mackie, BMET  n 2 weeks. Improved BP per MD note.

## 2020-03-11 ENCOUNTER — Encounter: Payer: Self-pay | Admitting: Internal Medicine

## 2020-03-14 ENCOUNTER — Other Ambulatory Visit: Payer: BC Managed Care – PPO

## 2020-03-21 ENCOUNTER — Other Ambulatory Visit: Payer: BC Managed Care – PPO

## 2020-03-21 ENCOUNTER — Other Ambulatory Visit: Payer: Self-pay

## 2020-03-21 DIAGNOSIS — I10 Essential (primary) hypertension: Secondary | ICD-10-CM

## 2020-03-22 ENCOUNTER — Encounter: Payer: Self-pay | Admitting: Internal Medicine

## 2020-03-22 LAB — BASIC METABOLIC PANEL
BUN/Creatinine Ratio: 22 (ref 9–23)
BUN: 12 mg/dL (ref 6–24)
CO2: 26 mmol/L (ref 20–29)
Calcium: 9.6 mg/dL (ref 8.7–10.2)
Chloride: 94 mmol/L — ABNORMAL LOW (ref 96–106)
Creatinine, Ser: 0.54 mg/dL — ABNORMAL LOW (ref 0.57–1.00)
GFR calc Af Amer: 120 mL/min/{1.73_m2} (ref >59)
GFR calc non Af Amer: 104 mL/min/{1.73_m2} (ref >59)
Glucose: 105 mg/dL — ABNORMAL HIGH (ref 65–99)
Potassium: 4.6 mmol/L (ref 3.5–5.2)
Sodium: 135 mmol/L (ref 134–144)

## 2020-03-26 ENCOUNTER — Ambulatory Visit: Payer: BC Managed Care – PPO | Admitting: Dermatology

## 2020-03-26 ENCOUNTER — Telehealth: Payer: BC Managed Care – PPO | Admitting: Medical

## 2020-03-26 ENCOUNTER — Encounter: Payer: Self-pay | Admitting: Medical

## 2020-03-26 ENCOUNTER — Other Ambulatory Visit: Payer: Self-pay

## 2020-03-26 DIAGNOSIS — L918 Other hypertrophic disorders of the skin: Secondary | ICD-10-CM

## 2020-03-26 DIAGNOSIS — L821 Other seborrheic keratosis: Secondary | ICD-10-CM | POA: Diagnosis not present

## 2020-03-26 DIAGNOSIS — R0981 Nasal congestion: Secondary | ICD-10-CM

## 2020-03-26 DIAGNOSIS — Z85828 Personal history of other malignant neoplasm of skin: Secondary | ICD-10-CM

## 2020-03-26 DIAGNOSIS — D2371 Other benign neoplasm of skin of right lower limb, including hip: Secondary | ICD-10-CM

## 2020-03-26 DIAGNOSIS — I8310 Varicose veins of unspecified lower extremity with inflammation: Secondary | ICD-10-CM

## 2020-03-26 DIAGNOSIS — R519 Headache, unspecified: Secondary | ICD-10-CM

## 2020-03-26 DIAGNOSIS — L814 Other melanin hyperpigmentation: Secondary | ICD-10-CM

## 2020-03-26 DIAGNOSIS — Z1283 Encounter for screening for malignant neoplasm of skin: Secondary | ICD-10-CM | POA: Diagnosis not present

## 2020-03-26 DIAGNOSIS — L820 Inflamed seborrheic keratosis: Secondary | ICD-10-CM | POA: Diagnosis not present

## 2020-03-26 DIAGNOSIS — L578 Other skin changes due to chronic exposure to nonionizing radiation: Secondary | ICD-10-CM

## 2020-03-26 DIAGNOSIS — D1800 Hemangioma unspecified site: Secondary | ICD-10-CM

## 2020-03-26 DIAGNOSIS — L570 Actinic keratosis: Secondary | ICD-10-CM | POA: Diagnosis not present

## 2020-03-26 DIAGNOSIS — D229 Melanocytic nevi, unspecified: Secondary | ICD-10-CM

## 2020-03-26 DIAGNOSIS — Z79899 Other long term (current) drug therapy: Secondary | ICD-10-CM

## 2020-03-26 NOTE — Patient Instructions (Signed)
Cryotherapy Aftercare  . Wash gently with soap and water everyday.   . Apply Vaseline and Band-Aid daily until healed.  

## 2020-03-26 NOTE — Progress Notes (Signed)
° °  Subjective:    Patient ID: Bridget Reed, female    DOB: [birth year], 59 y.o.   MRN: [id number]  HPI 59 yo female in non acute distress consents to telemedicine appointment. She would like to take something for nasal congestion and sinus headache, but is not sure what medication she can take with her blood pressure medication.  She denies any fever or chills, or colored nasal discharge.   Review of Systems  HENT: Positive for congestion (nasal), sinus pressure and sinus pain (headache forehead).   Cardiovascular:       History of hypertension on medication       Objective:   Physical Exam  AXOX3 No physical exam was performed due to telemedicine call.     Assessment & Plan:  Nasal congestion, sinus headache.  May take plain  OTC Mucinex or Coricidan hBP, both are  Guaifenesin only as the active ingredient. She knows to call the clinic or see her PCP if woresening symptoms , colored discharge, fever or chills. Reviewed with patient, she verbalizes understanding and has no further questions at the end of our conversation.

## 2020-03-26 NOTE — Patient Instructions (Signed)
Guaifenesin oral ER tablets What is this medicine? GUAIFENESIN (gwye FEN e sin) is an expectorant. It helps to thin mucous and make coughs more productive. This medicine is used to treat coughs caused by colds or the flu. It is not intended to treat chronic cough caused by smoking, asthma, emphysema, or heart failure. This medicine may be used for other purposes; ask your health care provider or pharmacist if you have questions. COMMON BRAND NAME(S): Humibid, Mucinex What should I tell my health care provider before I take this medicine? They need to know if you have any of these conditions:  fever  kidney disease  an unusual or allergic reaction to guaifenesin, other medicines, foods, dyes, or preservatives  pregnant or trying to get pregnant  breast-feeding How should I use this medicine? Take this medicine by mouth with a full glass of water. Follow the directions on the prescription label. Do not break, chew or crush this medicine. You may take with food or on an empty stomach. Take your medicine at regular intervals. Do not take your medicine more often than directed. Talk to your pediatrician regarding the use of this medicine in children. While this drug may be prescribed for children as young as 6 years old for selected conditions, precautions do apply. Overdosage: If you think you have taken too much of this medicine contact a poison control center or emergency room at once. NOTE: This medicine is only for you. Do not share this medicine with others. What if I miss a dose? If you miss a dose, take it as soon as you can. If it is almost time for your next dose, take only that dose. Do not take double or extra doses. What may interact with this medicine? Interactions are not expected. This list may not describe all possible interactions. Give your health care provider a list of all the medicines, herbs, non-prescription drugs, or dietary supplements you use. Also tell them if you  smoke, drink alcohol, or use illegal drugs. Some items may interact with your medicine. What should I watch for while using this medicine? Do not treat a cough for more than 1 week without consulting your doctor or health care professional. If you also have a high fever, skin rash, continuing headache, or sore throat, see your doctor. For best results, drink 6 to 8 glasses water daily while you are taking this medicine. What side effects may I notice from receiving this medicine? Side effects that you should report to your doctor or health care professional as soon as possible:  allergic reactions like skin rash, itching or hives, swelling of the face, lips, or tongue Side effects that usually do not require medical attention (report to your doctor or health care professional if they continue or are bothersome):  dizziness  headache  stomach upset This list may not describe all possible side effects. Call your doctor for medical advice about side effects. You may report side effects to FDA at 1-800-FDA-1088. Where should I keep my medicine? Keep out of the reach of children. Store at room temperature between 20 and 25 degrees C (68 and 77 degrees F). Keep container tightly closed. Throw away any unused medicine after the expiration date. NOTE: This sheet is a summary. It may not cover all possible information. If you have questions about this medicine, talk to your doctor, pharmacist, or health care provider.  2020 Elsevier/Gold Standard (2007-09-21 12:14:14)

## 2020-03-26 NOTE — Progress Notes (Signed)
Follow-Up Visit   Subjective  Bridget Reed is a 59 y.o. female who presents for the following: TBSE (Hx BCC right chest (2019), Hx AK). No spots of concern today.  She has a couple of irritated growths on her leg and chest.  The following portions of the chart were reviewed this encounter and updated as appropriate:      Review of Systems:  No other skin or systemic complaints except as noted in HPI or Assessment and Plan.  Objective  Well appearing patient in no apparent distress; mood and affect are within normal limits.  A full examination was performed including scalp, head, eyes, ears, nose, lips, neck, chest, axillae, abdomen, back, buttocks, bilateral upper extremities, bilateral lower extremities, hands, feet, fingers, toes, fingernails, and toenails. All findings within normal limits unless otherwise noted below.  Objective  Right Pretibia x 1, Right Chest x 1 (2): Erythematous keratotic or waxy stuck-on papule   Objective  Nasal Dorsum x 1: Pink scaly macule   Assessment & Plan   Skin cancer screening performed today.  Actinic Damage - diffuse erythematous macules with underlying dyspigmentation - Recommend daily broad spectrum sunscreen SPF 30+ to sun-exposed areas, reapply every 2 hours as needed.  - Call for new or changing lesions.  History of Basal Cell Carcinoma of the Skin - No evidence of recurrence today of the right chest - Recommend regular full body skin exams - Recommend daily broad spectrum sunscreen SPF 30+ to sun-exposed areas, reapply every 2 hours as needed.  - Call if any new or changing lesions are noted between office visits  Seborrheic Keratoses - Stuck-on, waxy, tan-brown papules and plaques  - Discussed benign etiology and prognosis. - Observe - Call for any changes  Lentigines - Scattered tan macules - Discussed due to sun exposure - Benign, observe - Call for any changes  Melanocytic Nevi - Tan-brown and/or  pink-flesh-colored symmetric macules and papules - Benign appearing on exam today - Observation - Call clinic for new or changing moles - Recommend daily use of broad spectrum spf 30+ sunscreen to sun-exposed areas.   Acrochordons (Skin Tags) - Fleshy, skin-colored pedunculated papules - Benign appearing.  - Observe. - If desired, they can be removed with an in office procedure that is not covered by insurance. - Please call the clinic if you notice any new or changing lesions.  Hemangiomas - Red papules - Discussed benign nature - Observe - Call for any changes  Spider Veins - Dilated blue, purple or red veins at the lower extremities - Reassured - These can be treated by sclerotherapy (a procedure to inject a medicine into the veins to make them disappear) if desired, but the treatment is not covered by insurance  Dermatofibroma - Firm pink/brown papulenodule with dimple sign on right foot dorsum - Benign appearing - Call for any changes   Inflamed seborrheic keratosis (2) Right Pretibia x 1, Right Chest x 1  Destruction of lesion - Right Pretibia x 1, Right Chest x 1  Destruction method: cryotherapy   Informed consent: discussed and consent obtained   Lesion destroyed using liquid nitrogen: Yes   Region frozen until ice ball extended beyond lesion: Yes   Outcome: patient tolerated procedure well with no complications   Post-procedure details: wound care instructions given    AK (actinic keratosis) Nasal Dorsum x 1  Destruction of lesion - Nasal Dorsum x 1  Destruction method: cryotherapy   Informed consent: discussed and consent obtained  Lesion destroyed using liquid nitrogen: Yes   Region frozen until ice ball extended beyond lesion: Yes   Outcome: patient tolerated procedure well with no complications   Post-procedure details: wound care instructions given    Return in about 1 year (around 03/26/2021) for TBSE.   IJamesetta Orleans, CMA, am acting as scribe  for Brendolyn Patty, MD .  Documentation: I have reviewed the above documentation for accuracy and completeness, and I agree with the above.  Brendolyn Patty MD

## 2020-08-13 ENCOUNTER — Ambulatory Visit: Payer: BC Managed Care – PPO | Admitting: Internal Medicine

## 2020-08-14 ENCOUNTER — Ambulatory Visit: Payer: BC Managed Care – PPO | Admitting: Internal Medicine

## 2020-08-14 ENCOUNTER — Encounter: Payer: Self-pay | Admitting: Internal Medicine

## 2020-08-14 ENCOUNTER — Other Ambulatory Visit: Payer: Self-pay

## 2020-08-14 VITALS — BP 150/70 | HR 96 | Temp 98.1°F | Ht 62.0 in | Wt 249.8 lb

## 2020-08-14 DIAGNOSIS — H1013 Acute atopic conjunctivitis, bilateral: Secondary | ICD-10-CM

## 2020-08-14 DIAGNOSIS — I10 Essential (primary) hypertension: Secondary | ICD-10-CM

## 2020-08-14 DIAGNOSIS — Z1231 Encounter for screening mammogram for malignant neoplasm of breast: Secondary | ICD-10-CM

## 2020-08-14 DIAGNOSIS — Z0000 Encounter for general adult medical examination without abnormal findings: Secondary | ICD-10-CM | POA: Diagnosis not present

## 2020-08-14 DIAGNOSIS — R739 Hyperglycemia, unspecified: Secondary | ICD-10-CM

## 2020-08-14 DIAGNOSIS — H00015 Hordeolum externum left lower eyelid: Secondary | ICD-10-CM

## 2020-08-14 DIAGNOSIS — H5789 Other specified disorders of eye and adnexa: Secondary | ICD-10-CM | POA: Diagnosis not present

## 2020-08-14 DIAGNOSIS — Z23 Encounter for immunization: Secondary | ICD-10-CM

## 2020-08-14 MED ORDER — OLOPATADINE HCL 0.2 % OP SOLN: 1.0000 [drp] | Freq: Every day | OPHTHALMIC | 5 refills | Status: DC

## 2020-08-14 MED ORDER — POLYMYXIN B-TRIMETHOPRIM 10000-0.1 UNIT/ML-% OP SOLN: 1.0000 [drp] | Freq: Four times a day (QID) | OPHTHALMIC | 0 refills | Status: DC

## 2020-08-14 MED ORDER — TETANUS-DIPHTH-ACELL PERTUSSIS 5-2.5-18.5 LF-MCG/0.5 IM SUSP: 0.5000 mL | Freq: Once | INTRAMUSCULAR | 0 refills | Status: AC

## 2020-08-14 NOTE — Progress Notes (Signed)
Chief Complaint  Patient presents with  . Follow-up  . Hypertension   Annual  1. htn uncontrolled on norvasc 2.5 mg qd and lis/hctz was taking 2 pills daily but decided to take 1 pill since Sunday  2. Watery itchy eyes and red eye left lower eyelid she has allergies takes claritin 10 mg qd prn and not helping    Review of Systems  Constitutional: Negative for weight loss.  HENT: Negative for hearing loss.   Eyes: Positive for redness.       +watery eyes   Respiratory: Negative for shortness of breath.   Cardiovascular: Negative for chest pain.  Gastrointestinal: Negative for abdominal pain.  Musculoskeletal: Positive for back pain. Negative for falls and joint pain.  Skin: Positive for itching. Negative for rash.  Neurological: Negative for dizziness and headaches.  Psychiatric/Behavioral: Negative for depression.   Past Medical History:  Diagnosis Date  . Allergy   . Basal cell carcinoma 06/17/2017   right chest   . GERD (gastroesophageal reflux disease)   . Hypertension   . Left tennis elbow   . Morbid obesity ([place]) 07/13/2016   Past Surgical History:  Procedure Laterality Date  . COLONOSCOPY    . COLONOSCOPY WITH PROPOFOL N/A 10/14/2015   Procedure: COLONOSCOPY WITH PROPOFOL;  Surgeon: Lucilla Lame, MD;  Location: [facility];  Service: Endoscopy;  Laterality: N/A;   Family History  Problem Relation Age of Onset  . COPD Mother   . Heart failure Mother   . Pancreatic cancer Mother   . Cancer Mother        pancretic  . Osteopenia Mother   . Arthritis Mother   . Cancer Father        lung/kidney never smoker   . Kidney cancer Father   . Hypertension Father   . Cancer Paternal Aunt        colon and breast cancer in 2 aunts  . Breast cancer Paternal Aunt 78  . Breast cancer Cousin   . Cancer Cousin        breast cancer   . Hypertension Brother   . Atrial fibrillation Brother   . Heart disease Maternal Grandmother   . Cancer Maternal Grandfather         lukemia   Social History   Socioeconomic History  . Marital status: Married    Spouse name: Not on file  . Number of children: Not on file  . Years of education: Not on file  . Highest education level: Not on file  Occupational History  . Not on file  Tobacco Use  . Smoking status: Never Smoker  . Smokeless tobacco: Never Used  Substance and Sexual Activity  . Alcohol use: Yes    Alcohol/week: 9.0 standard drinks    Types: 9 Glasses of wine per week  . Drug use: No  . Sexual activity: Yes  Other Topics Concern  . Not on file  Social History Narrative   Diet: moderate.. Working on improving .   Walks 3-4 times a week.    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 amLODipine (NORVASC) 2.5 MG tablet  Take 1 tablet (2.5 mg total) by mouth daily. For Blood pressure >130/>80  . furosemide (LASIX) 20 MG tablet Take 1 tablet (20 mg total) by mouth daily as needed.  Marland Kitchen lisinopril-hydrochlorothiazide (ZESTORETIC) 20-12.5 MG tablet Take 1 tablet by mouth daily. In am if BP >130/>80 02/27/20 take 2 pills in the am each day  . loratadine (CLARITIN) 10 MG tablet Take 10 mg by mouth daily.  . Olopatadine HCl (PATADAY) 0.2 % SOLN Apply 1 drop to eye daily. Prn itchy watery eyes  . omeprazole (PRILOSEC) 40 MG capsule Take 1 capsule (40 mg total) by mouth daily. In am 30 minutes before breakfast  . triamcinolone (NASACORT) 55 MCG/ACT AERO nasal inhaler Place 1 spray into the nose as needed.    Allergies  Allergen Reactions  . Mobic [Meloxicam] Swelling    In hands and ankles and under eyes.  . Erythromycin     Rash, GI upset  . Erythromycin Base   . Influenza Vac Split Quad      Possible allergy- rash after injection  . Shrimp [Shellfish Allergy] Other (See Comments)    Headache when eaten in excess  . Sulfa Antibiotics     unknown  . Azithromycin Rash   No results found for this or any previous visit (from the past 2160 hour(s)). Objective  Body mass index is 45.69 kg/m. Wt Readings from Last 3 Encounters:  08/14/20 249 lb 12.8 oz (113.3 kg)  03/05/20 243 lb 6.4 oz (110.4 kg)  02/26/20 244 lb 12.8 oz (111 kg)   Temp Readings from Last 3 Encounters:  08/14/20 98.1 F (36.7 C) (Oral)  03/05/20 98.4 F (36.9 C) (Oral)  02/26/20 (!) 96.9 F (36.1 C) (Temporal)   BP Readings from Last 3 Encounters:  08/14/20 (!) 150/70  03/05/20 136/78  02/26/20 (!) 192/68   Pulse Readings from Last 3 Encounters:  08/14/20 96  03/05/20 89  02/26/20 88    Physical Exam Vitals and nursing note reviewed.  Constitutional:      Appearance: Normal appearance. She is well-developed and well-groomed. She is morbidly obese.  HENT:     Head: Normocephalic and atraumatic.  Eyes:     Conjunctiva/sclera: Conjunctivae normal.     Pupils: Pupils are equal, round, and reactive to light.  Cardiovascular:     Rate and Rhythm: Normal rate and regular rhythm.     Heart sounds: Normal heart sounds. No murmur heard.   Pulmonary:     Effort: Pulmonary effort is normal.     Breath sounds: Normal breath sounds.  Skin:    General: Skin is warm and dry.  Neurological:     General: No focal deficit present.     Mental Status: She is alert and oriented to person, place, and time. Mental status is at baseline.     Gait: Gait normal.  Psychiatric:        Attention and Perception: Attention and perception normal.        Mood and Affect: Mood and affect normal.        Speech: Speech normal.        Behavior: Behavior normal. Behavior is cooperative.        Thought Content: Thought content normal.        Cognition and Memory: Cognition and memory normal.        Judgment: Judgment  normal.     Assessment  Plan  Annual physical exam Flu shot utd 02/14/20 Pfizer 3/3 Td due 10/2009 duewill check  with Tyler Deis, sent to pharmacy  Disc shingrix will check with Elonor call backto schedule  Consider hep B vaccine DeclinesHep C/HIV  rec healthy diet and exercise   08/02/19 mammogram negativeordered 07/2020   Pap neg 07/18/19 neg neg hpv  Colonoscopy had 10/19/15 Dr. Allen Norris repeat in 5 years had int hemorrhoids   [place]erm[place]3 [place] 4/2021[place] [facility] sunscreen congratulated11/2/21sch 03/2021 due for f/u    Never smoker congratulateddoes not like smoking never did   rec mvt + vitamin D3 2000 IU qd  rec healthy diet and exercise  Allergic conjunctivitis of both eyes - Plan: Olopatadine HCl (PATADAY) 0.2 % SOLN Itchy, watery, and red eye - Plan: Olopatadine HCl (PATADAY) 0.2 % SOLN  Hypertension novasc 2.5 mg qd lis hctz rec take 2 pills daily in the am  Monitor BP and My chart me   Provider: Dr. Olivia Mackie McLean-Scocuzza-[facility]

## 2020-08-14 NOTE — Patient Instructions (Addendum)
Colonoscopy due 10/18/20 Dr. Allen Norris call to schedule  Mammogram call to schedule  Eyelid wipes     Allergic Conjunctivitis, Adult Allergic conjunctivitis is inflammation of the conjunctiva. The conjunctiva is the thin, clear membrane that covers the white part of the eye and the inner surface of the eyelid. In this condition:  The blood vessels in the conjunctiva become irritated and swell.  The eyes become red or pink and feel itchy. Allergic conjunctivitis cannot be spread from person to person. This condition can develop at any age and may be outgrown. What are the causes? This condition is caused by allergens. These are things that can cause an allergic reaction in some people but not in other people. Common allergens include:  Outdoor allergens, such as: ? Pollen, including pollen from grass and weeds. ? Mold spores. ? Car fumes.  Indoor allergens, such as: ? Dust. ? Smoke. ? Mold spores. ? Proteins in a pet's urine, saliva, or dander. What increases the risk? You may be more likely to develop this condition if you have a family history of these things:  Allergies.  Conditions caused by being exposed to allergens, such as: ? Allergic rhinitis. This is an allergic reaction that affects the nose. ? Bronchial asthma. This condition affects the large airways in the lungs and makes breathing difficult. ? Atopic dermatitis (eczema). This is inflammation of the skin that is long-term (chronic). What are the signs or symptoms? Symptoms of this condition include eyes that are:  Itchy.  Red.  Watery.  Puffy. Your eyes may also:  Sting or burn.  Have clear fluid draining from them.  Have thick mucus discharge and pain (vernal conjunctivitis). How is this diagnosed? This condition may be diagnosed by:  Your medical history.  A physical exam.  Tests of the fluid draining from your eyes to rule out other causes.  Other tests to confirm the diagnosis,  including: ? Testing for allergies. The skin may be pricked with a tiny needle. The pricked area is then exposed to small amounts of allergens. ? Testing for other eye conditions. Tests may include:  Blood tests.  Tissue scrapings from your eyelid. The tissue is then checked under a microscope. How is this treated? This condition may be treated with:  Cold, wet cloths (cold compresses) to soothe itching and swelling.  Washing the face to remove allergens.  Eye drops. These may be prescription or over-the-counter. You may need to try different types to see which one works best for you, such as: ? Eye drops that block the allergic reaction (antihistamine). ? Eye drops that reduce swelling and irritation (anti-inflammatory). ? Steroid eye drops, which may be given if other treatments have not worked (vernal conjunctivitis).  Oral antihistamine medicines. These are medicines taken by mouth to lessen your allergic reaction. You may need these if eye drops do not help or are difficult to use.   Follow these instructions at home: Eye care  Apply a clean, cold compress to your eyes for 10-20 minutes, 3-4 times a day.  Do not touch or rub your eyes.  Do not wear contact lenses until the inflammation is gone. Wear glasses instead.  Do not wear eye makeup until the inflammation is gone. General instructions  Avoid known allergens whenever possible.  Take or apply over-the-counter and prescription medicines only as told by your health care provider. These include any eye drops.  Drink enough fluid to keep your urine pale yellow.  Keep all follow-up visits as  told by your health care provider. This is important. Contact a health care provider if:  Your symptoms get worse or do not get better with treatment.  You have mild eye pain.  You become sensitive to light.  You have spots or blisters on your eyes.  You have pus draining from your eyes.  You have a fever. Get help right  away if:  You have redness, swelling, or other symptoms in only one eye.  Your vision is blurred or you have other vision changes.  You have severe eye pain. Summary  Allergic conjunctivitis is inflammation of the clear membrane that covers the white part of the eye and the inner surface of the eyelid.  Take or apply over-the-counter and prescription medicines only as told by your health care provider. These include eye drops.  Do not touch or rub your eyes.  Contact a health care provider if your symptoms get worse or do not get better with treatment. This information is not intended to replace advice given to you by your health care provider. Make sure you discuss any questions you have with your health care provider. Document Revised: 04/03/2019 Document Reviewed: 04/03/2019 Elsevier Patient Education  2021 [employer].

## 2020-08-14 NOTE — Addendum Note (Signed)
Addended by: Orland Mustard on: 08/14/2020 12:43 PM   Modules accepted: Orders

## 2020-09-03 ENCOUNTER — Encounter: Payer: Self-pay | Admitting: Internal Medicine

## 2020-10-29 ENCOUNTER — Encounter: Payer: Self-pay | Admitting: Medical

## 2020-10-29 ENCOUNTER — Other Ambulatory Visit: Payer: Self-pay

## 2020-10-29 ENCOUNTER — Ambulatory Visit: Payer: BC Managed Care – PPO | Admitting: Medical

## 2020-10-29 VITALS — BP 158/64 | HR 102 | Temp 98.8°F | Resp 16 | Ht 62.0 in | Wt 230.0 lb

## 2020-10-29 DIAGNOSIS — H04203 Unspecified epiphora, bilateral lacrimal glands: Secondary | ICD-10-CM

## 2020-10-29 DIAGNOSIS — H1013 Acute atopic conjunctivitis, bilateral: Secondary | ICD-10-CM

## 2020-10-29 DIAGNOSIS — T7840XA Allergy, unspecified, initial encounter: Secondary | ICD-10-CM

## 2020-10-29 MED ORDER — CIPROFLOXACIN HCL 0.3 % OP SOLN: OPHTHALMIC | 0 refills | Status: DC

## 2020-10-29 NOTE — Patient Instructions (Signed)

## 2020-10-29 NOTE — Progress Notes (Signed)
   Subjective:    Patient ID: Bridget Reed, female    DOB: [birth year], 60 y.o.   MRN: [id number]  HPI 60 yo female in non acute distress complains of eyes watering bilaterally.. Seen by PCP with watery and redness  Treated with eye drops , Paataday and Polymixin b sulfate.. After using the polytrim drops she would have extreme itching in both eyse for about  2 hours..The eyes are "annoying" with them tearing all the time.  Denies visual changes.  Blood pressure (!) 158/64, pulse (!) 102, temperature 98.8 F (37.1 C), temperature source Oral, resp. rate 16, height 5\' 2"  (1.575 m), weight 230 lb (104.3 kg), last menstrual period 11/22/2013, SpO2 95 %.    20/50 R 20/40 L 20/40 bilateral  Allergies  Allergen Reactions  . Mobic [Meloxicam] Swelling    In hands and ankles and under eyes.  . Erythromycin     Rash, GI upset  . Erythromycin Base   . Influenza Vac Split Quad     Possible allergy- rash after injection  . Shrimp [Shellfish Allergy] Other (See Comments)    Headache when eaten in excess  . Sulfa Antibiotics     unknown  . Azithromycin Rash    Review of Systems  Eyes: Positive for redness and itching. Negative for photophobia, pain, discharge and visual disturbance.  Neurological: Negative for headaches.       Objective:   Physical Exam Constitutional:      Appearance: Normal appearance.  HENT:     Head: Normocephalic and atraumatic.  Eyes:     General: Lids are normal. Vision grossly intact. Gaze aligned appropriately.        Right eye: No discharge.        Left eye: No discharge.     Extraocular Movements: Extraocular movements intact.     Conjunctiva/sclera:     Right eye: Right conjunctiva is injected. No chemosis, exudate or hemorrhage.    Left eye: Left conjunctiva is injected. No chemosis, exudate or hemorrhage.    Pupils: Pupils are equal, round, and reactive to light.  Neurological:     Mental Status: She is alert.           Assessment  & Plan:  Allergic Conjunctivitis Bilateral  Allergic reaction to  Antibiotic drops Polymixin B w. sulfate, Stop polymixin B this has sulfate and you have an allergy to sulfa drugs. Watery eyes Continue the Pataday as directed. OTC Motrin per package directions for discomfort and inflammation. Referral to [facility] for further examination. Cool compresses to the eyes for relief. Return to the clinic as needed.Marland Kitchen

## 2020-12-17 ENCOUNTER — Other Ambulatory Visit: Payer: Self-pay

## 2020-12-17 ENCOUNTER — Ambulatory Visit
Admission: RE | Admit: 2020-12-17 | Discharge: 2020-12-17 | Disposition: A | Discharge status: Home / Self Care | Payer: BC Managed Care – PPO | Source: Ambulatory Visit | Attending: Internal Medicine | Admitting: Internal Medicine

## 2020-12-17 DIAGNOSIS — Z1231 Encounter for screening mammogram for malignant neoplasm of breast: Secondary | ICD-10-CM

## 2021-01-23 ENCOUNTER — Other Ambulatory Visit: Payer: Self-pay | Admitting: Internal Medicine

## 2021-01-23 DIAGNOSIS — I10 Essential (primary) hypertension: Secondary | ICD-10-CM

## 2021-01-26 ENCOUNTER — Other Ambulatory Visit: Payer: Self-pay | Admitting: Internal Medicine

## 2021-01-26 DIAGNOSIS — I10 Essential (primary) hypertension: Secondary | ICD-10-CM

## 2021-03-03 ENCOUNTER — Other Ambulatory Visit: Payer: Self-pay

## 2021-03-03 ENCOUNTER — Ambulatory Visit: Payer: BC Managed Care – PPO | Admitting: Medical

## 2021-03-03 ENCOUNTER — Encounter: Payer: Self-pay | Admitting: Medical

## 2021-03-03 VITALS — BP 148/63 | HR 83 | Temp 98.3°F | Resp 16 | Ht 62.0 in | Wt 248.8 lb

## 2021-03-03 DIAGNOSIS — R051 Acute cough: Secondary | ICD-10-CM

## 2021-03-03 DIAGNOSIS — R519 Headache, unspecified: Secondary | ICD-10-CM

## 2021-03-03 LAB — POC COVID19 BINAXNOW: SARS Coronavirus 2 Ag: NEGATIVE

## 2021-03-03 NOTE — Progress Notes (Signed)
   Subjective:    Patient ID: Bridget Reed, female    DOB: [birth year], 60 y.o.   MRN: [id number]  HPI 60 yo female in non acute distress presents today with  2 day history of sinus HA and pressure in forehead, drainage from ears down her neck. Took Mucinex yesterday, took Tylenol and Advil with some relief.  Review of Systems  Constitutional:  Negative for chills, fatigue and fever.  HENT:  Positive for sinus pressure and sinus pain. Negative for ear pain, postnasal drip, rhinorrhea, sneezing and sore throat.   Eyes:  Negative for discharge.  Respiratory:  Negative for cough and shortness of breath.   Cardiovascular:  Negative for chest pain.  Gastrointestinal:  Negative for abdominal pain and diarrhea.  Musculoskeletal:  Negative for myalgias.  Skin:  Negative for rash.  Allergic/Immunologic: Positive for environmental allergies.  Neurological:  Positive for headaches. Negative for dizziness, syncope and light-headedness.   Taking claritin and flonase daily.    Objective:   Physical Exam Vitals and nursing note reviewed.  Constitutional:      Appearance: Normal appearance. She is obese.  HENT:     Head: Normocephalic and atraumatic.     Right Ear: Ear canal and external ear normal.     Left Ear: Ear canal and external ear normal.     Mouth/Throat:     Mouth: Mucous membranes are moist.     Pharynx: Oropharynx is clear.  Eyes:     Extraocular Movements: Extraocular movements intact.     Conjunctiva/sclera: Conjunctivae normal.     Pupils: Pupils are equal, round, and reactive to light.  Cardiovascular:     Rate and Rhythm: Normal rate and regular rhythm.     Heart sounds: Normal heart sounds.  Pulmonary:     Effort: Pulmonary effort is normal.     Breath sounds: Normal breath sounds.  Musculoskeletal:        General: Normal range of motion.     Cervical back: Normal range of motion and neck supple.  Lymphadenopathy:     Cervical: No cervical adenopathy.  Skin:     General: Skin is warm and dry.  Neurological:     General: No focal deficit present.     Mental Status: She is alert and oriented to person, place, and time.  Psychiatric:        Mood and Affect: Mood normal.        Behavior: Behavior normal.        Thought Content: Thought content normal.        Judgment: Judgment normal.    Covid-19 negative      Assessment & Plan:  Enocunter for Covid-19 screening Sinus HA and pressure OTC Mucinex plain, ( history of  HTN) continue Tylenol or Ibuprofen for pain per package instructions.  ETD bilateral Continue Loratadine and Fluticasone as directed.  Worsening pain, fever or yellow/green d/c from nose or cough to contact the office and an antibiotic will be prescribed. Patient verbalizes understanding and has no questions at [place].

## 2021-03-21 NOTE — Patient Instructions (Signed)
Sinus Headache A sinus headache happens when your sinuses get swollen or blocked (clogged). Sinuses are spaces behind the bones of your face and forehead. You may feel pain or pressure in your face, forehead, ears, or upper teeth. Sinus headaches can be mild or very bad. Follow these instructions at home: General instructions If told: Apply a warm, moist washcloth to your face. This can help to lessen pain. Use a nasal saline wash. Follow the directions on the bottle or box. Hydrate and humidify Drink enough water to keep your pee (urine) pale yellow. Use a cool mist humidifier to keep the humidity level in your home above 50%. Breathe in steam for 10-15 minutes, 3-4 times a day or as told by your doctor. You can do this in the bathroom while a hot shower is running. Try not to spend time in cool or dry air. Medicines  Take over-the-counter and prescription medicines only as told by your doctor. If you were prescribed an antibiotic medicine, take it as told by your doctor. Do not stop taking it even if you start to feel better. Use a nose spray if your nose feels full of mucus (congested). Contact a doctor if: You get more than one headache a week. Light or sound bothers you. You have a fever. You feel sick to your stomach (nauseous) or you throw up (vomit). Your headaches do not get better with treatment. Get help right away if: You have trouble seeing. You suddenly have very bad pain in your face or head. You start to have quick, sudden movements or shaking that you cannot control (seizure). You are confused. You have a stiff neck. Summary A sinus headache happens when your sinuses get swollen or blocked (clogged). Sinuses are spaces behind the bones of your face and forehead. You may feel pain or pressure in your face, forehead, ears, or upper teeth. Take over-the-counter and prescription medicines only as told by your doctor. If told, apply a warm, moist washcloth to your face.  This can help to lessen pain. This information is not intended to replace advice given to you by your health care provider. Make sure you discuss any questions you have with your health care provider. Document Revised: 02/20/2020 Document Reviewed: 02/20/2020 Elsevier Patient Education  2022 [employer].

## 2021-04-01 ENCOUNTER — Encounter: Payer: BC Managed Care – PPO | Admitting: Dermatology

## 2021-04-24 ENCOUNTER — Other Ambulatory Visit: Payer: Self-pay | Admitting: Internal Medicine

## 2021-04-24 DIAGNOSIS — K219 Gastro-esophageal reflux disease without esophagitis: Secondary | ICD-10-CM

## 2021-04-28 ENCOUNTER — Other Ambulatory Visit: Payer: Self-pay

## 2021-04-28 ENCOUNTER — Ambulatory Visit: Payer: BC Managed Care – PPO | Admitting: Medical

## 2021-04-28 ENCOUNTER — Encounter: Payer: Self-pay | Admitting: Medical

## 2021-04-28 VITALS — BP 138/48 | HR 123 | Temp 98.4°F | Resp 16

## 2021-04-28 DIAGNOSIS — J9801 Acute bronchospasm: Secondary | ICD-10-CM

## 2021-04-28 DIAGNOSIS — J069 Acute upper respiratory infection, unspecified: Secondary | ICD-10-CM

## 2021-04-28 DIAGNOSIS — H6983 Other specified disorders of Eustachian tube, bilateral: Secondary | ICD-10-CM

## 2021-04-28 DIAGNOSIS — R051 Acute cough: Secondary | ICD-10-CM

## 2021-04-28 DIAGNOSIS — H6502 Acute serous otitis media, left ear: Secondary | ICD-10-CM

## 2021-04-28 MED ORDER — AMOXICILLIN-POT CLAVULANATE 875-125 MG PO TABS: 1.0000 | ORAL_TABLET | Freq: Two times a day (BID) | ORAL | 0 refills | Status: DC

## 2021-04-28 MED ORDER — BENZONATATE 100 MG PO CAPS: ORAL_CAPSULE | ORAL | 0 refills | Status: DC

## 2021-04-28 MED ORDER — ALBUTEROL SULFATE HFA 108 (90 BASE) MCG/ACT IN AERS: 2.0000 | INHALATION_SPRAY | Freq: Four times a day (QID) | RESPIRATORY_TRACT | 2 refills | Status: DC | PRN

## 2021-04-28 NOTE — Patient Instructions (Signed)
Cough, Adult A cough helps to clear your throat and lungs. A cough may be a sign of an illness or another medical condition. An acute cough may only last 2-3 weeks, while a chronic cough may last 8 or more weeks. Many things can cause a cough. They include: Germs (viruses or bacteria) that attack the airway. Breathing in things that bother (irritate) your lungs. Allergies. Asthma. Mucus that runs down the back of your throat (postnasal drip). Smoking. Acid backing up from the stomach into the tube that moves food from the mouth to the stomach (gastroesophageal reflux). Some medicines. Lung problems. Other medical conditions, such as heart failure or a blood clot in the lung (pulmonary embolism). Follow these instructions at home: Medicines Take over-the-counter and prescription medicines only as told by your doctor. Talk with your doctor before you take medicines that stop a cough (cough suppressants). Lifestyle  Do not smoke, and try not to be around smoke. Do not use any products that contain nicotine or tobacco, such as cigarettes, e-cigarettes, and chewing tobacco. If you need help quitting, ask your doctor. Drink enough fluid to keep your pee (urine) pale yellow. Avoid caffeine. Do not drink alcohol if your doctor tells you not to drink. General instructions  Watch for any changes in your cough. Tell your doctor about them. Always cover your mouth when you cough. Stay away from things that make you cough, such as perfume, candles, campfire smoke, or cleaning products. If the air is dry, use a cool mist vaporizer or humidifier in your home. If your cough is worse at night, try using extra pillows to raise your head up higher while you sleep. Rest as needed. Keep all follow-up visits as told by your doctor. This is important. Contact a doctor if: You have new symptoms. You cough up pus. Your cough does not get better after 2-3 weeks, or your cough gets worse. Cough medicine  does not help your cough and you are not sleeping well. You have pain that gets worse or pain that is not helped with medicine. You have a fever. You are losing weight and you do not know why. You have night sweats. Get help right away if: You cough up blood. You have trouble breathing. Your heartbeat is very fast. These symptoms may be an emergency. Do not wait to see if the symptoms will go away. Get medical help right away. Call your local emergency services (911 in the [place] Do not drive yourself to the hospital. Summary A cough helps to clear your throat and lungs. Many things can cause a cough. Take over-the-counter and prescription medicines only as told by your doctor. Always cover your mouth when you cough. Contact a doctor if you have new symptoms or you have a cough that does not get better or gets worse. This information is not intended to replace advice given to you by your health care provider. Make sure you discuss any questions you have with your health care provider. Document Revised: 06/30/2019 Document Reviewed: 05/30/2018 Elsevier Patient Education  [place]. Otitis Media, Adult Otitis media is a condition in which the middle ear is red and swollen (inflamed) and full of fluid. The middle ear is the part of the ear that contains bones for hearing as well as air that helps send sounds to the brain. The condition usually goes away on its own. What are the causes? This condition is caused by a blockage in the eustachian tube. This tube connects the  middle ear to the back of the nose. It normally allows air into the middle ear. The blockage is caused by fluid or swelling. Problems that can cause blockage include: A cold or infection that affects the nose, mouth, or throat. Allergies. An irritant, such as tobacco smoke. Adenoids that have become large. The adenoids are soft tissue located in the back of the throat, behind the nose and the roof of the mouth. Growth  or swelling in the upper part of the throat, just behind the nose (nasopharynx). Damage to the ear caused by a change in pressure. This is called barotrauma. What increases the risk? You are more likely to develop this condition if you: Smoke or are exposed to tobacco smoke. Have an opening in the roof of your mouth (cleft palate). Have acid reflux. Have problems in your body's defense system (immune system). What are the signs or symptoms? Symptoms of this condition include: Ear pain. Fever. Problems with hearing. Being tired. Fluid leaking from the ear. Ringing in the ear. How is this treated? This condition can go away on its own within 3-5 days. But if the condition is caused by germs (bacteria) and does not go away on its own, or if it keeps coming back, your doctor may: Give you antibiotic medicines. Give you medicines for pain. Follow these instructions at home: Take over-the-counter and prescription medicines only as told by your doctor. If you were prescribed an antibiotic medicine, take it as told by your doctor. Do not stop taking it even if you start to feel better. Keep all follow-up visits. Contact a doctor if: You have bleeding from your nose. There is a lump on your neck. You are not feeling better in 5 days. You feel worse instead of better. Get help right away if: You have pain that is not helped with medicine. You have swelling, redness, or pain around your ear. You get a stiff neck. You cannot move part of your face (paralysis). You notice that the bone behind your ear hurts when you touch it. You get a very bad headache. Summary Otitis media means that the middle ear is red, swollen, and full of fluid. This condition usually goes away on its own. If the problem does not go away, treatment may be needed. You may be given medicines to treat the infection or to treat your pain. If you were prescribed an antibiotic medicine, take it as told by your doctor. Do  not stop taking it even if you start to feel better. Keep all follow-up visits. This information is not intended to replace advice given to you by your health care provider. Make sure you discuss any questions you have with your health care provider. Document Revised: 08/19/2020 Document Reviewed: 08/19/2020 Elsevier Patient Education  [place].

## 2021-04-28 NOTE — Progress Notes (Signed)
Subjective:    Patient ID: Bridget Reed, female    DOB: 18-Sep-1960, 60 y.o.   MRN: [id number]  HPI 60 yo in non acute distress, started Nov 25 tested black Friday and then again on the last Tuesday both Covid-19 tests were negative. Nasal congestion , cough , ear pain L>R., no fever or chills SOB with coughing at times.  Recently return from vacation [place], [place],  [place].Flight aggravated ears.   Blood pressure (!) 138/48, pulse (!) 123, temperature 98.4 F (36.9 C), temperature source Tympanic, resp. rate 16, last menstrual period 11/22/2013, SpO2 98 %.   Recheck HR 108, BP140/70. Allergies  Allergen Reactions   Mobic [Meloxicam] Swelling    In hands and ankles and under eyes.   Polytrim [Polymyxin B-Trimethoprim] Itching    Itching of eyes after use. Patient allergic to sulfa   Erythromycin     Rash, GI upset   Erythromycin Base    Influenza Vac Split Quad     Possible allergy- rash after injection   Shrimp [Shellfish Allergy] Other (See Comments)    Headache when eaten in excess   Sulfa Antibiotics     unknown   Azithromycin Rash     Influenza vaccinated pre medicates with Benadrlyl Vaccinated  Covid-19  w/ [place] and  2 boosters  Review of Systems  Constitutional:  Negative for chills, fatigue and fever.  HENT:  Positive for congestion, ear pain, postnasal drip, rhinorrhea (clear), sinus pressure (maxillary) and sneezing (first few days). Negative for ear discharge and sore throat.   Respiratory:  Positive for cough and shortness of breath (with coughing alot). Negative for wheezing.   Cardiovascular:  Negative for chest pain.  Gastrointestinal:  Negative for abdominal pain, diarrhea, nausea and vomiting.  Genitourinary:  Negative for difficulty urinating.  Musculoskeletal:  Negative for myalgias.  Skin:  Negative for color change.  Allergic/Immunologic: Positive for environmental allergies. Negative for food allergies (shrimp) and immunocompromised state.   Neurological:  Negative for dizziness, syncope, light-headedness and headaches.      Objective:   Physical Exam Vitals and nursing note reviewed.  Constitutional:      Appearance: Normal appearance. She is obese.  HENT:     Head: Normocephalic and atraumatic.     Right Ear: Hearing, ear canal and external ear normal. A middle ear effusion is present.     Left Ear: Hearing, ear canal and external ear normal. A middle ear effusion is present. Tympanic membrane is erythematous.     Nose: Congestion present.     Mouth/Throat:     Mouth: Mucous membranes are moist.     Pharynx: Oropharynx is clear.  Eyes:     Extraocular Movements: Extraocular movements intact.     Conjunctiva/sclera: Conjunctivae normal.     Pupils: Pupils are equal, round, and reactive to light.  Cardiovascular:     Rate and Rhythm: Normal rate and regular rhythm.     Heart sounds: No murmur heard.   No friction rub. No gallop.  Pulmonary:     Effort: Pulmonary effort is normal.     Breath sounds: Normal breath sounds.     Comments: Coughing in room Musculoskeletal:        General: Normal range of motion.     Cervical back: Normal range of motion.  Skin:    General: Skin is warm and dry.     Capillary Refill: Capillary refill takes less than 2 seconds.  Neurological:     General: No focal  deficit present.     Mental Status: She is alert and oriented to person, place, and time. Mental status is at baseline.  Psychiatric:        Mood and Affect: Mood normal.        Behavior: Behavior normal.        Thought Content: Thought content normal.        Judgment: Judgment normal.     Patient coughing in room.     Assessment & Plan:  Otitis Media leftj, right also looks like it is turning red. Takes Claritin daily for allergies. I think chest  infection is viral. Cough / bronchospasm ETD bilateral Meds ordered this encounter  Medications   amoxicillin-clavulanate (AUGMENTIN) 875-125 MG tablet    Sig: Take  1 tablet by mouth 2 (two) times daily.    Dispense:  20 tablet    Refill:  0   benzonatate (TESSALON) 100 MG capsule    Sig: Take 1-2 every 8 hours as needed for cough , swallow whole.    Dispense:  20 capsule    Refill:  0   albuterol (VENTOLIN HFA) 108 (90 Base) MCG/ACT inhaler    Sig: Inhale 2 puffs into the lungs every 6 (six) hours as needed for wheezing or shortness of breath.    Dispense:  8 g    Refill:  2   Return in 3-5 days if not improving

## 2021-04-30 ENCOUNTER — Ambulatory Visit (INDEPENDENT_AMBULATORY_CARE_PROVIDER_SITE_OTHER): Payer: BC Managed Care – PPO | Admitting: Dermatology

## 2021-04-30 ENCOUNTER — Other Ambulatory Visit: Payer: Self-pay

## 2021-04-30 DIAGNOSIS — L821 Other seborrheic keratosis: Secondary | ICD-10-CM

## 2021-04-30 DIAGNOSIS — L918 Other hypertrophic disorders of the skin: Secondary | ICD-10-CM

## 2021-04-30 DIAGNOSIS — L309 Dermatitis, unspecified: Secondary | ICD-10-CM

## 2021-04-30 DIAGNOSIS — L578 Other skin changes due to chronic exposure to nonionizing radiation: Secondary | ICD-10-CM

## 2021-04-30 DIAGNOSIS — Z1283 Encounter for screening for malignant neoplasm of skin: Secondary | ICD-10-CM

## 2021-04-30 DIAGNOSIS — D229 Melanocytic nevi, unspecified: Secondary | ICD-10-CM

## 2021-04-30 DIAGNOSIS — Z85828 Personal history of other malignant neoplasm of skin: Secondary | ICD-10-CM

## 2021-04-30 DIAGNOSIS — L720 Epidermal cyst: Secondary | ICD-10-CM

## 2021-04-30 DIAGNOSIS — D225 Melanocytic nevi of trunk: Secondary | ICD-10-CM | POA: Diagnosis not present

## 2021-04-30 DIAGNOSIS — L738 Other specified follicular disorders: Secondary | ICD-10-CM

## 2021-04-30 DIAGNOSIS — D1800 Hemangioma unspecified site: Secondary | ICD-10-CM

## 2021-04-30 DIAGNOSIS — L814 Other melanin hyperpigmentation: Secondary | ICD-10-CM

## 2021-04-30 NOTE — Patient Instructions (Addendum)
Eucrisa Ointment - Apply to dry patch on left upper eyelid and area on the back of neck twice daily until improved.   If You Need Anything After Your Visit  If you have any questions or concerns for your doctor, please call our main line at [phone] and press option 4 to reach your doctor's medical assistant. If no one answers, please leave a voicemail as directed and we will return your call as soon as possible. Messages left after 4 pm will be answered the following business day.   You may also send [place] a message via [place]. We typically respond to MyChart messages within 1-2 business days.  For prescription refills, please ask your pharmacy to contact our office. Our fax number is [phone].  If you have an urgent issue when the clinic is closed that cannot wait until the next business day, you can page your doctor at the number below.    Please note that while we do our best to be available for urgent issues outside of office hours, we are not available 24/7.   If you have an urgent issue and are unable to reach [place], you may choose to seek medical care at your doctor's office, retail clinic, urgent care center, or emergency room.  If you have a medical emergency, please immediately call 911 or go to the emergency department.  Pager Numbers  - Dr. Nehemiah Massed: [phone]  - Dr. Laurence Ferrari: [phone]  - Dr. Nicole Kindred: [phone]  In the event of inclement weather, please call our main line at [phone] for an update on the status of any delays or closures.  Dermatology Medication Tips: Please keep the boxes that topical medications come in in order to help keep track of the instructions about where and how to use these. Pharmacies typically print the medication instructions only on the boxes and not directly on the medication tubes.   If your medication is too expensive, please contact our office at [phone] option 4 or send [place] a message through [place].   We are unable to  tell what your co-pay for medications will be in advance as this is different depending on your insurance coverage. However, we may be able to find a substitute medication at lower cost or fill out paperwork to get insurance to cover a needed medication.   If a prior authorization is required to get your medication covered by your insurance company, please allow [place] 1-2 business days to complete this process.  Drug prices often vary depending on where the prescription is filled and some pharmacies may offer cheaper prices.  The website www.goodrx.com contains coupons for medications through different pharmacies. The prices here do not account for what the cost may be with help from insurance (it may be cheaper with your insurance), but the website can give you the price if you did not use any insurance.  - You can print the associated coupon and take it with your prescription to the pharmacy.  - You may also stop by our office during regular business hours and pick up a GoodRx coupon card.  - If you need your prescription sent electronically to a different pharmacy, notify our office through [facility] or by phone at [phone] option 4.     Si Usted Necesita Algo Despus de Su Visita  Tambin puede enviarnos un mensaje a travs de Pharmacist, community. Por lo general respondemos a los mensajes de MyChart en el transcurso de 1 a 2 das hbiles.  Para renovar recetas,  por favor pida a su farmacia que se ponga en contacto con nuestra oficina. Harland Dingwall de fax es Okay [phone].  Si tiene un asunto urgente cuando la clnica est cerrada y que no puede esperar hasta el siguiente da hbil, puede llamar/localizar a su doctor(a) al nmero que aparece a continuacin.   Por favor, tenga en cuenta que aunque hacemos todo lo posible para estar disponibles para asuntos urgentes fuera del horario de [place], no estamos disponibles las 24 horas del da, los 7 das de la [place].   Si tiene un problema  urgente y no puede comunicarse con nosotros, puede optar por buscar atencin mdica  en el consultorio de su doctor(a), en una clnica privada, en un centro de atencin urgente o en una sala de emergencias.  Si tiene Engineering geologist, por favor llame inmediatamente al 911 o vaya a la sala de emergencias.  Nmeros de bper  - Dr. Nehemiah Massed: [phone]  - Dra. Moye: [phone]  - Dra. Nicole Kindred: [phone]  En caso de inclemencias del [place], por favor llame a Johnsie Kindred principal al [phone] para una actualizacin sobre el [place] de cualquier retraso o cierre.  Consejos para la medicacin en dermatologa: Por favor, guarde las cajas en las que vienen los medicamentos de uso tpico para ayudarle a seguir las instrucciones sobre dnde y cmo usarlos. Las farmacias generalmente imprimen las instrucciones del medicamento slo en las cajas y no directamente en los tubos del [place].   Si su medicamento es muy caro, por favor, pngase en contacto con Zigmund Daniel llamando al [phone] y presione la opcin 4 o envenos un mensaje a travs de Pharmacist, community.   No podemos decirle cul ser su copago por los medicamentos por adelantado ya que esto es diferente dependiendo de la cobertura de su seguro. Sin embargo, es posible que podamos encontrar un medicamento sustituto a Electrical engineer un formulario para que el seguro cubra el medicamento que se considera necesario.   Si se requiere una autorizacin previa para que su compaa de seguros [place] su medicamento, por favor permtanos de 1 a 2 das hbiles para completar este proceso.  Los precios de los medicamentos varan con frecuencia dependiendo del Environmental consultant de dnde se surte la receta y alguna farmacias pueden ofrecer precios ms baratos.  El sitio web www.goodrx.com tiene cupones para medicamentos de Airline pilot. Los precios aqu no tienen en cuenta lo que podra costar con la ayuda del seguro (puede ser ms barato con  su seguro), pero el sitio web puede darle el precio si no utiliz Research scientist (physical sciences).  - Puede imprimir el cupn correspondiente y llevarlo con su receta a la farmacia.  - Tambin puede pasar por nuestra oficina durante el horario de atencin regular y Charity fundraiser una tarjeta de cupones de GoodRx.  - Si necesita que su receta se enve electrnicamente a una farmacia diferente, informe a nuestra oficina a travs de MyChart de [place] o por telfono llamando al [phone] y presione la opcin 4.

## 2021-04-30 NOTE — Progress Notes (Signed)
Follow-Up Visit   Subjective  Bridget Reed is a 60 y.o. female who presents for the following: Annual Exam.  Patient here for TBSE. She has a dry patch on her left upper eyelid. An area on her posterior neck gets inflamed at times. She also has a tag under her breast that gets irritated. She has a history of BCC of the right chest, treated in 2019. Hx of AKs.   The following portions of the chart were reviewed this encounter and updated as appropriate:       Review of Systems:  No other skin or systemic complaints except as noted in HPI or Assessment and Plan.  Objective  Well appearing patient in no apparent distress; mood and affect are within normal limits.  A full examination was performed including scalp, head, eyes, ears, nose, lips, neck, chest, axillae, abdomen, back, buttocks, bilateral upper extremities, bilateral lower extremities, hands, feet, fingers, toes, fingernails, and toenails. All findings within normal limits unless otherwise noted below.  Left Medial Upper Eyelid Pink scaly macule medial upper eyelid  Posterior Neck at Hairline Pink slightly firm flat papule  R sacral area 1.0 mm med dark brown macule   Assessment & Plan  Dermatitis Left Medial Upper Eyelid  Eyelid Dermatitis  Start Eucrisa ointment apply to AA BID, sample given.   If not improving with topical, may need cryotherapy.   Epidermal inclusion cyst Posterior Neck at Hairline  Resolved.  Eucrisa ointment BID prn flares   Benign-appearing. Exam most consistent with an epidermal inclusion cyst, resolved.  Discussed that a cyst is a benign growth that can grow over time and sometimes get irritated or inflamed. Recommend observation if it is not bothersome.    Nevus R sacral area  Benign-appearing.  Observation.  Call clinic for new or changing moles.  Recommend daily use of broad spectrum spf 30+ sunscreen to sun-exposed areas.   Recheck on f/up  Skin cancer screening  performed today.  Actinic Damage - chronic, secondary to cumulative UV radiation exposure/sun exposure over time - diffuse scaly erythematous macules with underlying dyspigmentation - Recommend daily broad spectrum sunscreen SPF 30+ to sun-exposed areas, reapply every 2 hours as needed.  - Recommend staying in the shade or wearing long sleeves, sun glasses (UVA+UVB protection) and wide brim hats (4-inch brim around the entire circumference of the hat). - Call for new or changing lesions.  History of Basal Cell Carcinoma of the Skin - No evidence of recurrence today of the right chest - Recommend regular full body skin exams - Recommend daily broad spectrum sunscreen SPF 30+ to sun-exposed areas, reapply every 2 hours as needed.  - Call if any new or changing lesions are noted between office visits  Seborrheic Keratoses - Stuck-on, waxy, tan-brown papules and/or plaques  - Benign-appearing - Discussed benign etiology and prognosis. - Observe - Call for any changes  Lentigines - Scattered tan macules - Due to sun exposure - Benign-appering, observe - Recommend daily broad spectrum sunscreen SPF 30+ to sun-exposed areas, reapply every 2 hours as needed. - Call for any changes  Sebaceous Hyperplasia - Small yellow papules with a central dell of the face - Benign - Observe  Acrochordons (Skin Tags) - Fleshy, skin-colored pedunculated papules, including right inframammary and neck - Benign appearing.  - Observe. - If desired, they can be removed with an in office procedure that is not covered by insurance. - Please call the clinic if you notice any new or changing lesions.  Melanocytic Nevi - Tan-brown and/or pink-flesh-colored symmetric macules and papules - Benign appearing on exam today - Observation - Call clinic for new or changing moles - Recommend daily use of broad spectrum spf 30+ sunscreen to sun-exposed areas.   Hemangiomas - Red papules - Discussed benign  nature - Observe - Call for any changes  Return in about 1 year (around 04/30/2022) for TBSE.  IJamesetta Orleans, CMA, am acting as scribe for Brendolyn Patty, MD .  Documentation: I have reviewed the above documentation for accuracy and completeness, and I agree with the above.  Brendolyn Patty MD

## 2021-05-01 ENCOUNTER — Telehealth: Payer: Self-pay | Admitting: Nurse Practitioner

## 2021-05-01 MED ORDER — AMOXICILLIN 875 MG PO TABS: 875.0000 mg | ORAL_TABLET | Freq: Two times a day (BID) | ORAL | 0 refills | Status: AC

## 2021-05-01 NOTE — Telephone Encounter (Signed)
Augmentin is causing abdominal distress - she has been using Pepto to settle symptoms. Treated mainly for ears with ongoing sinus congestion. Advised switching to Amoxicillin - patient will follow up if not tolerating.

## 2021-10-02 ENCOUNTER — Ambulatory Visit: Payer: BC Managed Care – PPO | Admitting: Podiatry

## 2021-10-03 ENCOUNTER — Ambulatory Visit: Payer: BC Managed Care – PPO | Admitting: Podiatry

## 2021-10-03 ENCOUNTER — Ambulatory Visit (INDEPENDENT_AMBULATORY_CARE_PROVIDER_SITE_OTHER): Payer: BC Managed Care – PPO

## 2021-10-03 DIAGNOSIS — M7661 Achilles tendinitis, right leg: Secondary | ICD-10-CM

## 2021-10-03 MED ORDER — METHYLPREDNISOLONE 4 MG PO TBPK: ORAL_TABLET | ORAL | 0 refills | Status: DC

## 2021-10-03 MED ORDER — NAPROXEN 500 MG PO TABS: 500.0000 mg | ORAL_TABLET | Freq: Two times a day (BID) | ORAL | 0 refills | Status: DC

## 2021-10-03 MED ORDER — BETAMETHASONE SOD PHOS & ACET 6 (3-3) MG/ML IJ SUSP
3.0000 mg | Freq: Once | INTRAMUSCULAR | Status: AC
  Administered 2021-10-03: 3 mg via INTRA_ARTICULAR

## 2021-10-03 NOTE — Progress Notes (Signed)
? ?  HPI: 61 y.o. female presenting today as a new patient for evaluation of right posterior heel pain this been going on intermittently for several years.  She has had increased pain and tenderness especially over the past year.  She has not done anything currently for treatment. ? ?Past Medical History:  ?Diagnosis Date  ? Allergy   ? Basal cell carcinoma 06/17/2017  ? right chest   ? GERD (gastroesophageal reflux disease)   ? Hypertension   ? Left tennis elbow   ? Morbid obesity ([place]) 07/13/2016  ? ? ?Past Surgical History:  ?Procedure Laterality Date  ? COLONOSCOPY    ? COLONOSCOPY WITH PROPOFOL N/A 10/14/2015  ? Procedure: COLONOSCOPY WITH PROPOFOL;  Surgeon: Lucilla Lame, MD;  Location: [facility];  Service: Endoscopy;  Laterality: N/A;  ? ? ?Allergies  ?Allergen Reactions  ? Mobic [Meloxicam] Swelling  ?  In hands and ankles and under eyes.  ? Polytrim [Polymyxin B-Trimethoprim] Itching  ?  Itching of eyes after use. Patient allergic to sulfa  ? Erythromycin   ?  Rash, GI upset  ? Erythromycin Base   ? Influenza Vac Split Quad   ?  Possible allergy- rash after injection  ? Shrimp [Shellfish Allergy] Other (See Comments)  ?  Headache when eaten in excess  ? Sulfa Antibiotics   ?  unknown  ? Azithromycin Rash  ? ?  ?Physical Exam: ?General: The patient is alert and oriented x3 in no acute distress. ? ?Dermatology: Skin is warm, dry and supple bilateral lower extremities. Negative for open lesions or macerations. ? ?Vascular: Palpable pedal pulses bilaterally. No edema or erythema noted. Capillary refill within normal limits. ? ?Neurological: Epicritic and protective threshold grossly intact bilaterally.  ? ?Musculoskeletal Exam: Pain on palpation noted to the posterior tubercle of the right calcaneus at the insertion of the Achilles tendon consistent with retrocalcaneal bursitis. Range of motion within normal limits. Muscle strength 5/5 in all muscle groups bilateral lower extremities. ? ?Radiographic  Exam:  ?Posterior and plantar calcaneal spur noted to the respective calcaneus on lateral view. No fracture or dislocation noted. Normal osseous mineralization noted.    ? ?Assessment: ?1. Insertional Achilles tendinitis right ? ?Plan of Care:  ?1. Patient was evaluated. Radiographs were reviewed today. ?2. Injection of 0.5 mL Celestone Soluspan injected into the retrocalcaneal bursa. Care was taken to avoid direct injection into the Achilles tendon. ?3.  Prescription for Medrol Dosepak ?4.  Prescription for Naprosyn 500 mg twice daily.  Patient does have some GI sensitivity with Motrin 800 and meloxicam ?5.  Cam boot dispensed.  Weightbearing as tolerated x3 weeks ?6.  Return to clinic in 3 weeks.  If there is no significant improvement we may need to consider MRI to evaluate the health of the tendon and discuss possible surgery. ? ?*Husband is Event organiser.  I did achilles rupture surgery on him about 1 year ago.  ? ? ?Edrick Kins, DPM ?[place] ? ?Dr. Edrick Kins, DPM  ?  ?[address]                                      ?[place], [place] [address]                ?Office [phone]  ?Fax [phone] ? ? ? ? ? ?

## 2021-10-22 ENCOUNTER — Ambulatory Visit: Payer: BC Managed Care – PPO

## 2021-10-24 ENCOUNTER — Ambulatory Visit: Payer: BC Managed Care – PPO | Admitting: Podiatry

## 2021-10-24 DIAGNOSIS — M7661 Achilles tendinitis, right leg: Secondary | ICD-10-CM | POA: Diagnosis not present

## 2021-10-24 MED ORDER — BETAMETHASONE SOD PHOS & ACET 6 (3-3) MG/ML IJ SUSP
3.0000 mg | Freq: Once | INTRAMUSCULAR | Status: AC
  Administered 2021-10-24: 3 mg via INTRA_ARTICULAR

## 2021-10-24 NOTE — Progress Notes (Signed)
HPI: 61 y.o. female presenting today for follow-up evaluation of right posterior heel pain that has been going on for several years.  Patient states that the injection helped significantly.  She did not wear the cam boot because it was too loose.  She also states that she did not take the prednisone pack because of the side effects of irritability and she was on a business trip.  Past Medical History:  Diagnosis Date   Allergy    Basal cell carcinoma 06/17/2017   right chest    GERD (gastroesophageal reflux disease)    Hypertension    Left tennis elbow    Morbid obesity ([place]) 07/13/2016    Past Surgical History:  Procedure Laterality Date   COLONOSCOPY     COLONOSCOPY WITH PROPOFOL N/A 10/14/2015   Procedure: COLONOSCOPY WITH PROPOFOL;  Surgeon: Lucilla Lame, MD;  Location: [facility];  Service: Endoscopy;  Laterality: N/A;    Allergies  Allergen Reactions   Mobic [Meloxicam] Swelling    In hands and ankles and under eyes.   Polytrim [Polymyxin B-Trimethoprim] Itching    Itching of eyes after use. Patient allergic to sulfa   Erythromycin     Rash, GI upset   Erythromycin Base    Influenza Vac Split Quad     Possible allergy- rash after injection   Shrimp [Shellfish Allergy] Other (See Comments)    Headache when eaten in excess   Sulfa Antibiotics     unknown   Azithromycin Rash     Physical Exam: General: The patient is alert and oriented x3 in no acute distress.  Dermatology: Skin is warm, dry and supple bilateral lower extremities. Negative for open lesions or macerations.  Vascular: Palpable pedal pulses bilaterally. No edema or erythema noted. Capillary refill within normal limits.  Neurological: Epicritic and protective threshold grossly intact bilaterally.   Musculoskeletal Exam: There continues to be some pain on palpation noted to the posterior tubercle of the right calcaneus at the insertion of the Achilles tendon consistent with retrocalcaneal  bursitis. Range of motion within normal limits. Muscle strength 5/5 in all muscle groups bilateral lower extremities.  Radiographic Exam 10/03/2021:  Posterior and plantar calcaneal spur noted to the respective calcaneus on lateral view. No fracture or dislocation noted. Normal osseous mineralization noted.     Assessment: 1. Insertional Achilles tendinitis right  Plan of Care:  1. Patient was evaluated. Radiographs were reviewed today. 2. Injection of 0.5 mL Celestone Soluspan injected along the lateral aspect of the posterior heel.  Last injection was on the medial aspect which helped significantly.  Care was taken to avoid direct injection into the Achilles tendon. 3.  The patient never took the Medrol Dosepak.  She states that she was on a business trip and the prednisone pack makes her irritable.  She states that she will take the Medrol Dosepak this week 4.  Naprosyn seems to be helping.  Continue Naprosyn 500 mg twice daily.  Patient does have some GI sensitivity with Motrin 800 and meloxicam 5.  Patient states that the last cam boot was too large.  She was dispensed a medium.  Today we dispensed a small cam boot.  Wear daily x3 weeks 6.  No set return appointment.  Currently the patient states that the pain is very manageable.  If there is no significant improvement we may need to consider MRI to evaluate the health of the tendon and discuss possible surgery.  *Husband is Event organiser.  I did achilles  rupture surgery on him about 1 year ago.    Edrick Kins, DPM [facility]  Dr. Edrick Kins, DPM    [address], [place] [address]                Office [phone]  Fax [phone]

## 2021-12-30 ENCOUNTER — Encounter: Payer: Self-pay | Admitting: Nurse Practitioner

## 2021-12-30 ENCOUNTER — Ambulatory Visit (INDEPENDENT_AMBULATORY_CARE_PROVIDER_SITE_OTHER): Payer: BC Managed Care – PPO | Admitting: Nurse Practitioner

## 2021-12-30 VITALS — BP 124/72 | HR 95 | Temp 98.5°F | Ht 62.0 in | Wt 255.4 lb

## 2021-12-30 DIAGNOSIS — B372 Candidiasis of skin and nail: Secondary | ICD-10-CM

## 2021-12-30 DIAGNOSIS — R21 Rash and other nonspecific skin eruption: Secondary | ICD-10-CM

## 2021-12-30 MED ORDER — FLUCONAZOLE 150 MG PO TABS: ORAL_TABLET | ORAL | 0 refills | Status: DC

## 2021-12-30 MED ORDER — TRIAMCINOLONE ACETONIDE 0.1 % EX CREA: 1.0000 | TOPICAL_CREAM | Freq: Two times a day (BID) | CUTANEOUS | 0 refills | Status: DC

## 2021-12-30 NOTE — Progress Notes (Signed)
Subjective:    Patient ID: Bridget Reed, female    DOB: 11/21/1960, 61 y.o.   MRN: [id number]  HPI  61 year old female presenting to [facility] with complaints of a rash that has been present for the past 6-8 weeks.   The rash started on her right thigh in a patch like formation. She now has a rash that is present on both legs, arms, scattered on chest. She has also noted cracking in the corners of her lips that is new for her.   Denies a change in soap, detergent, lotions. Denies any new medications, supplements, or vitamins.   Her rash itches and itches more at night.  Denies any travel or staying in other beds  She shares a bed with her husband and he does not have any symptoms.   She has been taking a benadryl at night to help with itching, takes Claritin daily. She has also tried OTC hydrocortisone cream to itchy areas with relief.   Today's Vitals   12/30/21 0802  BP: 124/72  Pulse: 95  Temp: 98.5 F (36.9 C)  TempSrc: Tympanic  SpO2: 98%  Weight: 255 lb 6.4 oz (115.8 kg)  Height: '5\' 2"'$  (1.575 m)   Body mass index is 46.71 kg/m.    Current Outpatient Medications  Medication Instructions   amLODipine (NORVASC) 2.5 mg, Oral, Daily, For Blood pressure >130/>80   benzonatate (TESSALON) 100 MG capsule Take 1-2 every 8 hours as needed for cough , swallow whole.   fluconazole (DIFLUCAN) 150 MG tablet Take 1 tablet on days 1, 4 and 7 (every 72 hours)   furosemide (LASIX) 20 mg, Oral, Daily PRN   lisinopril-hydrochlorothiazide (ZESTORETIC) 20-12.5 MG tablet 1 tablet, Oral, Daily, In am if BP >130/>80 02/27/20 take 2 pills in the am each day   loratadine (CLARITIN) 10 mg, Oral, Daily   methylPREDNISolone (MEDROL DOSEPAK) 4 MG TBPK tablet 6 day dose pack - take as directed   naproxen (NAPROSYN) 500 mg, Oral, 2 times daily with meals   omeprazole (PRILOSEC) 40 mg, Oral, Daily, In am 30 minutes before breakfast   triamcinolone (NASACORT) 55 MCG/ACT AERO  nasal inhaler 1 spray, Nasal, As needed   triamcinolone cream (KENALOG) 0.1 % 1 Application, Topical, 2 times daily, Up to 14 days then stop     Review of Systems  Constitutional: Negative.   HENT: Negative.    Respiratory: Negative.    Cardiovascular: Negative.   Genitourinary: Negative.   Skin:  Positive for rash.  Neurological: Negative.        Objective:   Physical Exam HENT:     Head: Normocephalic.  Musculoskeletal:     Cervical back: Normal range of motion.  Skin:    General: Skin is warm.     Findings: Erythema and rash present. Rash is macular.          Comments: Macular rash scattered throughout bilateral upper and lower extremities. Involvement of central chest. Irritation under bilateral breasts without presence of rash.   Neurological:     Mental Status: She is alert.           Assessment & Plan:  1. Rash and nonspecific skin eruption  - triamcinolone cream (KENALOG) 0.1 %; Apply 1 Application topically 2 (two) times daily. Up to 14 days then stop  Dispense: 30 g; Refill: 0  2. Yeast infection of the skin  - fluconazole (DIFLUCAN) 150 MG tablet; Take 1 tablet on days 1, 4  and 7 (every 72 hours)  Dispense: 3 tablet; Refill: 0    Limit topical steroid to 14 days as discussed.  May use topical benadryl for comfort as well   Avoid irritants to skin  RTC in one week- will do labs (add gluten panel) in preporation for physical in September  Patient will return fasting

## 2022-01-07 ENCOUNTER — Other Ambulatory Visit: Payer: BC Managed Care – PPO

## 2022-01-07 ENCOUNTER — Encounter: Payer: Self-pay | Admitting: Nurse Practitioner

## 2022-01-07 ENCOUNTER — Ambulatory Visit (INDEPENDENT_AMBULATORY_CARE_PROVIDER_SITE_OTHER): Payer: BC Managed Care – PPO | Admitting: Nurse Practitioner

## 2022-01-07 VITALS — Temp 97.8°F

## 2022-01-07 DIAGNOSIS — Z1321 Encounter for screening for nutritional disorder: Secondary | ICD-10-CM

## 2022-01-07 NOTE — Progress Notes (Signed)
Subjective:    Patient ID: Bridget Reed, female    DOB: 1961-04-07, 61 y.o.   MRN: [id number]  HPI 61 year old female presenting to [facility] for follow up on her rash. After her first dose of Diflucan she noted improvement.   She has taken Diflucan x3.    Today's Vitals   01/07/22 0805  Temp: 97.8 F (36.6 C)   There is no height or weight on file to calculate BMI.    Review of Systems  Constitutional: Negative.   HENT: Negative.    Respiratory: Negative.    Skin:  Positive for rash.       Objective:   Physical Exam HENT:     Head: Normocephalic.  Pulmonary:     Effort: Pulmonary effort is normal.  Skin:    Findings: Erythema and rash present. Rash is macular and papular.       Neurological:     Mental Status: She is alert.     Recent Results (from the past 2160 hour(s))  B12 and Folate Panel     Status: None   Collection Time: 01/07/22  7:52 AM  Result Value Ref Range   Vitamin B-12 432 232 - 1,245 pg/mL   Folate 3.2 >3.0 ng/mL    Comment: A serum folate concentration of less than 3.1 ng/mL is considered to represent clinical deficiency.   Iron and TIBC     Status: None (Preliminary result)   Collection Time: 01/07/22  7:52 AM  Result Value Ref Range   Total Iron Binding Capacity WILL FOLLOW    UIBC WILL FOLLOW    Iron WILL FOLLOW    Iron Saturation WILL FOLLOW   Hgb A1c w/o eAG     Status: None   Collection Time: 01/07/22  8:05 AM  Result Value Ref Range   Hgb A1c MFr Bld 5.6 4.8 - 5.6 %    Comment:          Prediabetes: 5.7 - 6.4          Diabetes: >6.4          Glycemic control for adults with diabetes: <7.0   CMP12+LP+TP+TSH+6AC+CBC/D/Plt     Status: Abnormal   Collection Time: 01/07/22  8:05 AM  Result Value Ref Range   Glucose 110 (H) 70 - 99 mg/dL   Uric Acid 6.3 3.0 - 7.2 mg/dL    Comment:            Therapeutic target for gout patients: <6.0   BUN 9 8 - 27 mg/dL   Creatinine, Ser 0.54 (L) 0.57 - 1.00 mg/dL    eGFR 105 >59 mL/min/1.73   BUN/Creatinine Ratio 17 12 - 28   Sodium 131 (L) 134 - 144 mmol/L   Potassium 3.9 3.5 - 5.2 mmol/L   Chloride 92 (L) 96 - 106 mmol/L   Calcium 9.4 8.7 - 10.3 mg/dL   Phosphorus 3.5 3.0 - 4.3 mg/dL   Total Protein 6.6 6.0 - 8.5 g/dL   Albumin 4.5 3.8 - 4.9 g/dL   Globulin, Total 2.1 1.5 - 4.5 g/dL   Albumin/Globulin Ratio 2.1 1.2 - 2.2   Bilirubin Total 0.6 0.0 - 1.2 mg/dL   Alkaline Phosphatase 95 44 - 121 IU/L   LDH 195 119 - 226 IU/L   AST 19 0 - 40 IU/L   ALT 25 0 - 32 IU/L   GGT 32 0 - 60 IU/L   Iron 76 27 - 159 ug/dL  Cholesterol, Total 181 100 - 199 mg/dL   Triglycerides 121 0 - 149 mg/dL   HDL 53 >39 mg/dL   VLDL Cholesterol Cal 22 5 - 40 mg/dL   LDL Chol Calc (NIH) 106 (H) 0 - 99 mg/dL   Chol/HDL Ratio 3.4 0.0 - 4.4 ratio    Comment:                                   T. Chol/HDL Ratio                                             Men  Women                               1/2 Avg.Risk  3.4    3.3                                   Avg.Risk  5.0    4.4                                2X Avg.Risk  9.6    7.1                                3X Avg.Risk 23.4   11.0    Estimated CHD Risk 0.5 0.0 - 1.0 times avg.    Comment: The CHD Risk is based on the T. Chol/HDL ratio. Other factors affect CHD Risk such as hypertension, smoking, diabetes, severe obesity, and family history of premature CHD.    TSH 4.910 (H) 0.450 - 4.500 uIU/mL   T4, Total 8.6 4.5 - 12.0 ug/dL   T3 Uptake Ratio 31 24 - 39 %   Free Thyroxine Index 2.7 1.2 - 4.9   WBC 8.9 3.4 - 10.8 x10E3/uL   RBC 5.15 3.77 - 5.28 x10E6/uL   Hemoglobin 15.4 11.1 - 15.9 g/dL   Hematocrit 48.0 (H) 34.0 - 46.6 %   MCV 93 79 - 97 fL   MCH 29.9 26.6 - 33.0 pg   MCHC 32.1 31.5 - 35.7 g/dL   RDW 13.5 11.7 - 15.4 %   Platelets 285 150 - 450 x10E3/uL   Neutrophils 64 Not Estab. %   Lymphs 21 Not Estab. %   Monocytes 10 Not Estab. %   Eos 4 Not Estab. %   Basos 1 Not Estab. %   Neutrophils Absolute  5.7 1.4 - 7.0 x10E3/uL   Lymphocytes Absolute 1.9 0.7 - 3.1 x10E3/uL   Monocytes Absolute 0.9 0.1 - 0.9 x10E3/uL   EOS (ABSOLUTE) 0.4 0.0 - 0.4 x10E3/uL   Basophils Absolute 0.1 0.0 - 0.2 x10E3/uL   Immature Granulocytes 0 Not Estab. %   Immature Grans (Abs) 0.0 0.0 - 0.1 x10E3/uL  VITAMIN D 25 Hydroxy (Vit-D Deficiency, Fractures)     Status: Abnormal   Collection Time: 01/07/22  8:05 AM  Result Value Ref Range   Vit D, 25-Hydroxy 27.2 (L) 30.0 - 100.0 ng/mL    Comment: Vitamin D deficiency has been defined by the Institute  of Medicine and an Endocrine Society practice guideline as a level of serum 25-OH vitamin D less than 20 ng/mL (1,2). The Endocrine Society went on to further define vitamin D insufficiency as a level between 21 and 29 ng/mL (2). 1. IOM (Institute of Medicine). 2010. Dietary reference    intakes for calcium and D. [place]: The    [employer]. 2. Holick MF, B[place], Bischoff-Ferrari HA, et al.    Evaluation, treatment, and prevention of vitamin D    deficiency: an Endocrine Society clinical practice    guideline. JCEM. 2011 Jul; 96(7):1911-30.   Ferritin     Status: Abnormal   Collection Time: 01/07/22  8:05 AM  Result Value Ref Range   Ferritin 238 (H) 15 - 150 ng/mL  Celiac Panel     Status: None (Preliminary result)   Collection Time: 01/07/22  8:05 AM  Result Value Ref Range   Antigliadin Abs, IgA 6 0 - 19 units    Comment:                    Negative                   0 - 19                    Weak Positive             20 - 30                    Moderate to Strong Positive   >30    Gliadin IgG 2 0 - 19 units    Comment:                    Negative                   0 - 19                    Weak Positive             20 - 30                    Moderate to Strong Positive   >30    Transglutaminase IgA WILL FOLLOW          Assessment & Plan:  1. Encounter for vitamin deficiency screening Labs without evidence of systemic  infection/allergy/vitamin deficiency contributing to rash onset  Patient will continue to monitor and f/u if rash worsens to consider dermatology referral  Advised to wash with mild soap, avoid hot water   Follow up with PCP regarding lab

## 2022-01-07 NOTE — Progress Notes (Signed)
Labs

## 2022-01-08 ENCOUNTER — Telehealth: Payer: Self-pay | Admitting: Nurse Practitioner

## 2022-01-08 NOTE — Telephone Encounter (Signed)
Discussed labs that have returned today

## 2022-01-09 LAB — CMP12+LP+TP+TSH+6AC+CBC/D/PLT
ALT: 25 IU/L (ref 0–32)
AST: 19 IU/L (ref 0–40)
Albumin/Globulin Ratio: 2.1 (ref 1.2–2.2)
Albumin: 4.5 g/dL (ref 3.8–4.9)
Alkaline Phosphatase: 95 IU/L (ref 44–121)
BUN/Creatinine Ratio: 17 (ref 12–28)
BUN: 9 mg/dL (ref 8–27)
Basophils Absolute: 0.1 10*3/uL (ref 0.0–0.2)
Basos: 1 %
Bilirubin Total: 0.6 mg/dL (ref 0.0–1.2)
Calcium: 9.4 mg/dL (ref 8.7–10.3)
Chloride: 92 mmol/L — ABNORMAL LOW (ref 96–106)
Chol/HDL Ratio: 3.4 ratio (ref 0.0–4.4)
Cholesterol, Total: 181 mg/dL (ref 100–199)
Creatinine, Ser: 0.54 mg/dL — ABNORMAL LOW (ref 0.57–1.00)
EOS (ABSOLUTE): 0.4 10*3/uL (ref 0.0–0.4)
Eos: 4 %
Estimated CHD Risk: 0.5 times avg. (ref 0.0–1.0)
Free Thyroxine Index: 2.7 (ref 1.2–4.9)
GGT: 32 IU/L (ref 0–60)
Globulin, Total: 2.1 g/dL (ref 1.5–4.5)
Glucose: 110 mg/dL — ABNORMAL HIGH (ref 70–99)
HDL: 53 mg/dL (ref >39)
Hematocrit: 48 % — ABNORMAL HIGH (ref 34.0–46.6)
Hemoglobin: 15.4 g/dL (ref 11.1–15.9)
Immature Grans (Abs): 0 10*3/uL (ref 0.0–0.1)
Immature Granulocytes: 0 %
Iron: 76 ug/dL (ref 27–159)
LDH: 195 IU/L (ref 119–226)
LDL Chol Calc (NIH): 106 mg/dL — ABNORMAL HIGH (ref 0–99)
Lymphocytes Absolute: 1.9 10*3/uL (ref 0.7–3.1)
Lymphs: 21 %
MCH: 29.9 pg (ref 26.6–33.0)
MCHC: 32.1 g/dL (ref 31.5–35.7)
MCV: 93 fL (ref 79–97)
Monocytes Absolute: 0.9 10*3/uL (ref 0.1–0.9)
Monocytes: 10 %
Neutrophils Absolute: 5.7 10*3/uL (ref 1.4–7.0)
Neutrophils: 64 %
Phosphorus: 3.5 mg/dL (ref 3.0–4.3)
Platelets: 285 10*3/uL (ref 150–450)
Potassium: 3.9 mmol/L (ref 3.5–5.2)
RBC: 5.15 x10E6/uL (ref 3.77–5.28)
RDW: 13.5 % (ref 11.7–15.4)
Sodium: 131 mmol/L — ABNORMAL LOW (ref 134–144)
T3 Uptake Ratio: 31 % (ref 24–39)
T4, Total: 8.6 ug/dL (ref 4.5–12.0)
TSH: 4.91 u[IU]/mL — ABNORMAL HIGH (ref 0.450–4.500)
Total Protein: 6.6 g/dL (ref 6.0–8.5)
Triglycerides: 121 mg/dL (ref 0–149)
Uric Acid: 6.3 mg/dL (ref 3.0–7.2)
VLDL Cholesterol Cal: 22 mg/dL (ref 5–40)
WBC: 8.9 10*3/uL (ref 3.4–10.8)
eGFR: 105 mL/min/{1.73_m2} (ref >59)

## 2022-01-09 LAB — FERRITIN: Ferritin: 238 ng/mL — ABNORMAL HIGH (ref 15–150)

## 2022-01-09 LAB — VITAMIN D 25 HYDROXY (VIT D DEFICIENCY, FRACTURES): Vit D, 25-Hydroxy: 27.2 ng/mL — ABNORMAL LOW (ref 30.0–100.0)

## 2022-01-09 LAB — GLIA (IGA/G) + TTG IGA
Antigliadin Abs, IgA: 6 units (ref 0–19)
Gliadin IgG: 2 units (ref 0–19)
Transglutaminase IgA: <2 U/mL (ref 0–3)

## 2022-01-09 LAB — HGB A1C W/O EAG: Hgb A1c MFr Bld: 5.6 % (ref 4.8–5.6)

## 2022-01-10 LAB — IRON AND TIBC
Iron Saturation: 25 % (ref 15–55)
Iron: 79 ug/dL (ref 27–159)
Total Iron Binding Capacity: 312 ug/dL (ref 250–450)
UIBC: 233 ug/dL (ref 131–425)

## 2022-01-10 LAB — B12 AND FOLATE PANEL
Folate: 3.2 ng/mL (ref >3.0)
Vitamin B-12: 432 pg/mL (ref 232–1245)

## 2022-01-12 ENCOUNTER — Other Ambulatory Visit: Payer: Self-pay | Admitting: Nurse Practitioner

## 2022-01-12 ENCOUNTER — Encounter: Payer: Self-pay | Admitting: Nurse Practitioner

## 2022-01-12 DIAGNOSIS — R21 Rash and other nonspecific skin eruption: Secondary | ICD-10-CM

## 2022-01-14 ENCOUNTER — Other Ambulatory Visit: Payer: BC Managed Care – PPO

## 2022-01-14 ENCOUNTER — Ambulatory Visit: Payer: BC Managed Care – PPO | Admitting: Nurse Practitioner

## 2022-01-14 ENCOUNTER — Ambulatory Visit: Payer: BC Managed Care – PPO | Admitting: Dermatology

## 2022-01-14 DIAGNOSIS — L308 Other specified dermatitis: Secondary | ICD-10-CM | POA: Diagnosis not present

## 2022-01-14 DIAGNOSIS — L304 Erythema intertrigo: Secondary | ICD-10-CM

## 2022-01-14 DIAGNOSIS — L309 Dermatitis, unspecified: Secondary | ICD-10-CM | POA: Diagnosis not present

## 2022-01-14 DIAGNOSIS — R21 Rash and other nonspecific skin eruption: Secondary | ICD-10-CM

## 2022-01-14 MED ORDER — KETOCONAZOLE 2 % EX CREA: TOPICAL_CREAM | CUTANEOUS | 5 refills | Status: DC

## 2022-01-14 MED ORDER — LEVOCETIRIZINE DIHYDROCHLORIDE 5 MG PO TABS: 5.0000 mg | ORAL_TABLET | Freq: Every day | ORAL | 2 refills | Status: DC

## 2022-01-14 MED ORDER — CLOBETASOL PROPIONATE 0.05 % EX SOLN: CUTANEOUS | 1 refills | Status: DC

## 2022-01-14 NOTE — Patient Instructions (Addendum)
Start Xyzal - take 1 pill every night Continue Claritin every morning.  Eczema Skin Care  Buy TWO 16oz jars of CeraVe moisturizing cream  [facility], [facility], [facility] (no prescription needed)  Costs about $15 per jar   Jar #1: Use as a moisturizer as needed. Can be applied to any area of the body. Use twice daily to unaffected areas.  Jar #2: Pour one 72m bottle of clobetasol 0.05% solution into jar, mix well. Label this jar to indicate the medication has been added. Use twice daily to affected areas. Do not apply to face, groin or underarms.  Moisturizer may burn or sting initially. Try for at least 4 weeks.    Wound Care Instructions  Cleanse wound gently with soap and water once a day then pat dry with clean gauze. Apply a thin coat of Petrolatum (petroleum jelly, "Vaseline") over the wound (unless you have an allergy to this). We recommend that you use a new, sterile tube of Vaseline. Do not pick or remove scabs. Do not remove the yellow or white "healing tissue" from the base of the wound.  Cover the wound with fresh, clean, nonstick gauze and secure with paper tape. You may use Band-Aids in place of gauze and tape if the wound is small enough, but would recommend trimming much of the tape off as there is often too much. Sometimes Band-Aids can irritate the skin.  You should call the office for your biopsy report after 1 week if you have not already been contacted.  If you experience any problems, such as abnormal amounts of bleeding, swelling, significant bruising, significant pain, or evidence of infection, please call the office immediately.  FOR ADULT SURGERY PATIENTS: If you need something for pain relief you may take 1 extra strength Tylenol (acetaminophen) AND 2 Ibuprofen ('200mg'$  each) together every 4 hours as needed for pain. (do not take these if you are allergic to them or if you have a reason you should not take them.) Typically, you may only need pain medication for 1 to 3  days.    Intertrigo is a chronic recurrent rash that occurs in skin fold areas that may be associated with friction; heat; moisture; yeast; fungus; and bacteria.  It is exacerbated by increased movement / activity; sweating; and higher atmospheric temperature.   Due to recent changes in healthcare laws, you may see results of your pathology and/or laboratory studies on MyChart before the doctors have had a chance to review them. We understand that in some cases there may be results that are confusing or concerning to you. Please understand that not all results are received at the same time and often the doctors may need to interpret multiple results in order to provide you with the best plan of care or course of treatment. Therefore, we ask that you please give u[place]2 business days to thoroughly review all your results before contacting the office for clarification. Should we see a critical lab result, you will be contacted sooner.   If You Need Anything After Your Visit  If you have any questions or concerns for your doctor, please call our main line at [phone] press option 4 to reach your doctor's medical assistant. If no one answers, please leave a voicemail as directed and we will return your call as soon as possible. Messages left after 4 pm will be answered the following business day.   You may also send [place] message via [place] We typically respond to MyChart messages  within 1-2 business days.  For prescription refills, please ask your pharmacy to contact our office. Our fax number is [phone].  If you have an urgent issue when the clinic is closed that cannot wait until the next business day, you can page your doctor at the number below.    Please note that while we do our best to be available for urgent issues outside of office hours, we are not available 24/7.   If you have an urgent issue and are unable to reach [place], you may choose to seek medical care at your doctor's  office, retail clinic, urgent care center, or emergency room.  If you have a medical emergency, please immediately call 911 or go to the emergency department.  Pager Numbers  - Dr. Nehemiah Massed: [phone]  - Dr. Laurence Ferrari: [phone]  - Dr. Nicole Kindred: [phone]  In the event of inclement weather, please call our main line at [phone] for an update on the status of any delays or closures.  Dermatology Medication Tips: Please keep the boxes that topical medications come in in order to help keep track of the instructions about where and how to use these. Pharmacies typically print the medication instructions only on the boxes and not directly on the medication tubes.   If your medication is too expensive, please contact our office at [phone] option 4 or send [place] a message through Regino Ramirez.   We are unable to tell what your co-pay for medications will be in advance as this is different depending on your insurance coverage. However, we may be able to find a substitute medication at lower cost or fill out paperwork to get insurance to cover a needed medication.   If a prior authorization is required to get your medication covered by your insurance company, please allow [place] 1-2 business days to complete this process.  Drug prices often vary depending on where the prescription is filled and some pharmacies may offer cheaper prices.  The website www.goodrx.com contains coupons for medications through different pharmacies. The prices here do not account for what the cost may be with help from insurance (it may be cheaper with your insurance), but the website can give you the price if you did not use any insurance.  - You can print the associated coupon and take it with your prescription to the pharmacy.  - You may also stop by our office during regular business hours and pick up a GoodRx coupon card.  - If you need your prescription sent electronically to a different pharmacy, notify our office  through [facility] or by phone at [phone] option 4.     Si Usted Necesita Algo Despus de Su Visita  Tambin puede enviarnos un mensaje a travs de Pharmacist, community. Por lo general respondemos a los mensajes de MyChart en el transcurso de 1 a 2 das hbiles.  Para renovar recetas, por favor pida a su farmacia que se ponga en contacto con nuestra oficina. Harland Dingwall de fax es [place] [phone].  Si tiene un asunto urgente cuando la clnica est cerrada y que no puede esperar hasta el siguiente da hbil, puede llamar/localizar a su doctor(a) al nmero que aparece a continuacin.   Por favor, tenga en cuenta que aunque hacemos todo lo posible para estar disponibles para asuntos urgentes fuera del horario de [place], no estamos disponibles las 24 horas del da, los 7 das de la [place].   Si tiene un problema urgente y no puede comunicarse con nosotros, puede optar por  buscar atencin mdica  en el consultorio de su doctor(a), en una clnica privada, en un centro de atencin urgente o en una sala de emergencias.  Si tiene Engineering geologist, por favor llame inmediatamente al 911 o vaya a la sala de emergencias.  Nmeros de bper  - Dr. Nehemiah Massed: [phone]  - Dra. Moye: [phone]  - Dra. Nicole Kindred: [phone]  En caso de inclemencias del [place], por favor llame a Johnsie Kindred principal al [phone] para una actualizacin sobre el [place] de cualquier retraso o cierre.  Consejos para la medicacin en dermatologa: Por favor, guarde las cajas en las que vienen los medicamentos de uso tpico para ayudarle a seguir las instrucciones sobre dnde y cmo usarlos. Las farmacias generalmente imprimen las instrucciones del medicamento slo en las cajas y no directamente en los tubos del [place].   Si su medicamento es muy caro, por favor, pngase en contacto con Zigmund Daniel llamando al [phone] y presione la opcin 4 o envenos un mensaje a travs de Pharmacist, community.   No  podemos decirle cul ser su copago por los medicamentos por adelantado ya que esto es diferente dependiendo de la cobertura de su seguro. Sin embargo, es posible que podamos encontrar un medicamento sustituto a Electrical engineer un formulario para que el seguro cubra el medicamento que se considera necesario.   Si se requiere una autorizacin previa para que su compaa de seguros [place] su medicamento, por favor permtanos de 1 a 2 das hbiles para completar este proceso.  Los precios de los medicamentos varan con frecuencia dependiendo del Environmental consultant de dnde se surte la receta y alguna farmacias pueden ofrecer precios ms baratos.  El sitio web www.goodrx.com tiene cupones para medicamentos de Airline pilot. Los precios aqu no tienen en cuenta lo que podra costar con la ayuda del seguro (puede ser ms barato con su seguro), pero el sitio web puede darle el precio si no utiliz Research scientist (physical sciences).  - Puede imprimir el cupn correspondiente y llevarlo con su receta a la farmacia.  - Tambin puede pasar por nuestra oficina durante el horario de atencin regular y Charity fundraiser una tarjeta de cupones de GoodRx.  - Si necesita que su receta se enve electrnicamente a una farmacia diferente, informe a nuestra oficina a travs de MyChart de [place] o por telfono llamando al [phone] y presione la opcin 4.

## 2022-01-14 NOTE — Progress Notes (Signed)
Follow-Up Visit   Subjective  Bridget Reed is a 61 y.o. female who presents for the following: Rash.  Red spots on lower legs x 6-12 months, but getting worse. Very itchy, and itchy all over body where bumps aren't present. No recent traveling. No one else itching at home. No new medicines, no new lotions or detergents. Patient has a dog that comes indoors. Recently had a vet visit, and no problems, no fleas. Patient was put on Diflucan for yeast and improved some. Around 8 weeks ago, patients lips got very dry and cracked in the corners. She takes Claritin every morning and has started taking Benadryl at night. She uses Cortisone cream for itch. She has TMC 0.1% Cream that she uses on larger patches she had on the body (now clear). She has not tried on lower legs. Patient had to take allergy shots as a teenager.  The following portions of the chart were reviewed this encounter and updated as appropriate:       Review of Systems:  No other skin or systemic complaints except as noted in HPI or Assessment and Plan.  Objective  Well appearing patient in no apparent distress; mood and affect are within normal limits.  A focused examination was performed including face, arms, legs, trunk. Relevant physical exam findings are noted in the Assessment and Plan.  Left upper knee, Right medial knee Few small pink papules on the mid back; multiple small pink papules with surrounding hypopigmentation on the bilateral lower legs, feet and arms and hands.         Inframammary Erythema of the bilateral inframammary with small pink papules    Assessment & Plan  Rash Left upper knee; Right medial knee  Bite Reaction vs Urticaria vs Folliculitis vs other.  Punch biopsy x 2 performed today.   Start Xyzal '5mg'$  take 1 po QHS #30 2Rf  Start clobetasol solution Patient to mix in jar of CeraVe Cream. Apply to rash BID until improved. Avoid face, groin, axilla. Dsp 60m 1Rf.  Topical  steroids (such as triamcinolone, fluocinolone, fluocinonide, mometasone, clobetasol, halobetasol, betamethasone, hydrocortisone) can cause thinning and lightening of the skin if they are used for too long in the same area. Your physician has selected the right strength medicine for your problem and area affected on the body. Please use your medication only as directed by your physician to prevent side effects.      levocetirizine (XYZAL) 5 MG tablet - Left upper knee, Right medial knee Take 1 tablet (5 mg total) by mouth daily.  Skin / nail biopsy - Left upper knee Type of biopsy: punch   Informed consent: discussed and consent obtained   Patient was prepped and draped in usual sterile fashion: Area prepped with alcohol. Anesthesia: the lesion was anesthetized in a standard fashion   Anesthetic:  1% lidocaine w/ epinephrine 1-100,000 buffered w/ 8.4% NaHCO3 Punch size:  3.5 mm Suture size:  4-0 Suture type: nylon   Suture removal (days):  7 Hemostasis achieved with: suture and pressure   Outcome: patient tolerated procedure well   Post-procedure details: wound care instructions given   Post-procedure details comment:  Ointment and pressure dressing applied  Skin / nail biopsy - Right medial knee Type of biopsy: punch   Informed consent: discussed and consent obtained   Patient was prepped and draped in usual sterile fashion: Area prepped with alcohol. Anesthesia: the lesion was anesthetized in a standard fashion   Anesthetic:  1% lidocaine w/  epinephrine 1-100,000 buffered w/ 8.4% NaHCO3 Punch size:  3.5 mm Suture size:  4-0 Suture type: nylon   Suture removal (days):  7 Hemostasis achieved with: suture and pressure   Outcome: patient tolerated procedure well   Post-procedure details: wound care instructions given   Post-procedure details comment:  Ointment and pressure dressing applied  clobetasol (TEMOVATE) 0.05 % external solution - Left upper knee, Right medial knee Patient  to mix clobetasol solution in jar of CeraVe Cream. Apply to affected areas rash twice daily until improved. Avoid face, groin, axilla.  Specimen 1 - Surgical pathology Differential Diagnosis: Bite Reaction vs Urticaria vs Folliculitis vs other Check Margins: No Few small pink papules on the mid back; multiple small pink papules with surrounding hypopigmentation on the bilateral lower legs and arms. 3.61m punch  Specimen 2 - Surgical pathology Differential Diagnosis: Bite Reaction vs Urticaria vs Folliculitis vs other Check Margins: No Few small pink papules on the mid back; multiple small pink papules with surrounding hypopigmentation on the bilateral lower legs and arms. 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 Condition is symptomatic/ bothersome to patient. Not currently at goal.   Intertrigo is a chronic recurrent rash that occurs in skin fold areas that may be associated with friction; heat; moisture; yeast; fungus; and bacteria.  It is exacerbated by increased movement / activity; sweating; and higher atmospheric temperature.  Start ketoconazole 2% cream Apply to rash under breasts QD, BID with flares dsp 60g 5Rf  ketoconazole (NIZORAL) 2 % cream - Inframammary Apply to rash under breasts once a day, twice a day for flares   Return in about 1 week (around 01/21/2022) for suture removal, bx f/u.  I, Jamesetta OrleansCMA, am acting as scribe for TaBrendolyn PattyMD .  Documentation: I have reviewed the above documentation for accuracy and completeness, and I agree with the above.  TaBrendolyn PattyD

## 2022-01-21 ENCOUNTER — Encounter: Payer: Self-pay | Admitting: Dermatology

## 2022-01-21 ENCOUNTER — Other Ambulatory Visit: Payer: Self-pay | Admitting: Internal Medicine

## 2022-01-21 ENCOUNTER — Ambulatory Visit: Payer: BC Managed Care – PPO | Admitting: Dermatology

## 2022-01-21 ENCOUNTER — Other Ambulatory Visit: Payer: Self-pay | Admitting: Family

## 2022-01-21 DIAGNOSIS — L304 Erythema intertrigo: Secondary | ICD-10-CM

## 2022-01-21 DIAGNOSIS — R21 Rash and other nonspecific skin eruption: Secondary | ICD-10-CM

## 2022-01-21 DIAGNOSIS — I10 Essential (primary) hypertension: Secondary | ICD-10-CM

## 2022-01-21 MED ORDER — FLUCONAZOLE 200 MG PO TABS: ORAL_TABLET | ORAL | 0 refills | Status: DC

## 2022-01-21 MED ORDER — LISINOPRIL-HYDROCHLOROTHIAZIDE 20-12.5 MG PO TABS: 1.0000 | ORAL_TABLET | Freq: Every day | ORAL | 3 refills | Status: DC

## 2022-01-21 NOTE — Progress Notes (Unsigned)
   Follow-Up Visit   Subjective  Bridget Reed is a 61 y.o. female who presents for the following: Suture / Staple Removal (Here for suture removal and discuss pathology results. Patient reports no new medications, no lifestyle changes).    The following portions of the chart were reviewed this encounter and updated as appropriate:      Review of Systems: No other skin or systemic complaints except as noted in HPI or Assessment and Plan.   Objective  Well appearing patient in no apparent distress; mood and affect are within normal limits.  A focused examination was performed including legs. Relevant physical exam findings are noted in the Assessment and Plan.  legs Few small pink papules on the mid back; multiple small pink papules with surrounding hypopigmentation on the bilateral lower legs, feet and arms and hands  Inframammary Folds Erythematous patches   Assessment & Plan   Encounter for Removal of Sutures - Incision site at the B/L knees is clean, dry and intact - Wound cleansed, sutures removed, wound cleansed and steri strips applied.  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 - Scars remodel for a full year. - Once steri-strips fall off, patient can apply over-the-counter silicone scar cream each night to help with scar remodeling if desired. - Patient advised to call with any concerns or if they notice any new or changing lesions.  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  Punch biopsy x 2 performed today.    Continue Xyzal '5mg'$  take 1 po QHS #30 2Rf   Continue clobetasol solution Patient to mix in jar of CeraVe Cream. Apply to rash BID until improved. Avoid face, groin, axilla. Dsp 57m 1Rf. Use no more  than 4 weeks total. Avoid applying to face, groin, and axilla. Use as directed. Long-term use can cause thinning of the skin.    Topical steroids (such as triamcinolone, fluocinolone, fluocinonide, mometasone, clobetasol, halobetasol, betamethasone, hydrocortisone) can cause thinning and lightening of the skin if they are used for too long in the same area. Your physician has selected the right strength medicine for your problem and area affected on the body. Please use your medication only as directed by your physician to prevent side effects.     Related Medications levocetirizine (XYZAL) 5 MG tablet Take 1 tablet (5 mg total) by mouth daily.  clobetasol (TEMOVATE) 0.05 % external solution Patient to mix clobetasol solution in jar of CeraVe Cream. Apply to affected areas rash twice daily until improved. Avoid face, groin, axilla.  Intertrigo Inframammary Folds  Chronic and persistent condition with duration or expected duration over one year. Condition is symptomatic / bothersome to patient. Not to goal.  Continue Ketoconazole cream twice daily.   Take Fluconazole 200 mg once daily for 5 days.  Continue Zeasorb AF powder.  fluconazole (DIFLUCAN) 200 MG tablet - Inframammary Folds Take 1 tablet once daily for 5 days   Return if symptoms worsen or fail to improve.  I, JEmelia Salisbury CMA, am acting as scribe for TBrendolyn Patty MD.

## 2022-01-21 NOTE — Patient Instructions (Addendum)
Continue Xyzal '5mg'$  take 1 po QHS #30 2Rf   Continue clobetasol solution Patient to mix in jar of CeraVe Cream. Apply to rash BID until improved. Avoid face, groin, axilla. Dsp 8m 1Rf. Use no more than 4 weeks total. Avoid applying to face, groin, and axilla. Use as directed. Long-term use can cause thinning of the skin.    Topical steroids (such as triamcinolone, fluocinolone, fluocinonide, mometasone, clobetasol, halobetasol, betamethasone, hydrocortisone) can cause thinning and lightening of the skin if they are used for too long in the same area. Your physician has selected the right strength medicine for your problem and area affected on the body. Please use your medication only as directed by your physician to prevent side effects.     Continue Ketoconazole cream twice daily.   Take Fluconazole 200 mg once daily for 5 days.  Continue Zeasorb AF powder.   Gentle Skin Care Guide  1. Bathe no more than once a day.  2. Avoid bathing in hot water  3. Use a mild soap like Dove, Vanicream, Cetaphil, CeraVe. Can use Lever 2000 or Cetaphil antibacterial soap  4. Use soap only where you need it. On most days, use it under your arms, between your legs, and on your feet. Let the water rinse other areas unless visibly dirty.  5. When you get out of the bath/shower, use a towel to gently blot your skin dry, don't rub it.  6. While your skin is still a little damp, apply a moisturizing cream such as Vanicream, CeraVe, Cetaphil, Eucerin, Sarna lotion or plain Vaseline Jelly. For hands apply Neutrogena [place]Hand Cream or Excipial Hand Cream.  7. Reapply moisturizer any time you start to itch or feel dry.  8. Sometimes using free and clear laundry detergents can be helpful. Fabric softener sheets should be avoided. Downy Free & Gentle liquid, or any liquid fabric softener that is free of dyes and perfumes, it acceptable to use  9. If your doctor has given you prescription creams you may  apply moisturizers over them     Due to recent changes in healthcare laws, you may see results of your pathology and/or laboratory studies on MyChart before the doctors have had a chance to review them. We understand that in some cases there may be results that are confusing or concerning to you. Please understand that not all results are received at the same time and often the doctors may need to interpret multiple results in order to provide you with the best plan of care or course of treatment. Therefore, we ask that you please give u[place]2 business days to thoroughly review all your results before contacting the office for clarification. Should we see a critical lab result, you will be contacted sooner.   If You Need Anything After Your Visit  If you have any questions or concerns for your doctor, please call our main line at [phone] press option 4 to reach your doctor's medical assistant. If no one answers, please leave a voicemail as directed and we will return your call as soon as possible. Messages left after 4 pm will be answered the following business day.   You may also send [place] message via [place] We typically respond to MyChart messages within 1-2 business days.  For prescription refills, please ask your pharmacy to contact our office. Our fax number is [phone]  If you have an urgent issue when the clinic is closed that cannot wait until the next business day,  you can page your doctor at the number below.    Please note that while we do our best to be available for urgent issues outside of office hours, we are not available 24/7.   If you have an urgent issue and are unable to reach [place], you may choose to seek medical care at your doctor's office, retail clinic, urgent care center, or emergency room.  If you have a medical emergency, please immediately call 911 or go to the emergency department.  Pager Numbers  - Dr. Nehemiah Massed: [phone]  - Dr. Laurence Ferrari:  [phone]  - Dr. Nicole Kindred: [phone]  In the event of inclement weather, please call our main line at [phone] for an update on the status of any delays or closures.  Dermatology Medication Tips: Please keep the boxes that topical medications come in in order to help keep track of the instructions about where and how to use these. Pharmacies typically print the medication instructions only on the boxes and not directly on the medication tubes.   If your medication is too expensive, please contact our office at [phone] option 4 or send [place] a message through [place].   We are unable to tell what your co-pay for medications will be in advance as this is different depending on your insurance coverage. However, we may be able to find a substitute medication at lower cost or fill out paperwork to get insurance to cover a needed medication.   If a prior authorization is required to get your medication covered by your insurance company, please allow [place] 1-2 business days to complete this process.  Drug prices often vary depending on where the prescription is filled and some pharmacies may offer cheaper prices.  The website www.goodrx.com contains coupons for medications through different pharmacies. The prices here do not account for what the cost may be with help from insurance (it may be cheaper with your insurance), but the website can give you the price if you did not use any insurance.  - You can print the associated coupon and take it with your prescription to the pharmacy.  - You may also stop by our office during regular business hours and pick up a GoodRx coupon card.  - If you need your prescription sent electronically to a different pharmacy, notify our office through [facility] or by phone at [phone] option 4.     Si Usted Necesita Algo Despus de Su Visita  Tambin puede enviarnos un mensaje a travs de Pharmacist, community. Por lo general respondemos a los mensajes de  MyChart en el transcurso de 1 a 2 das hbiles.  Para renovar recetas, por favor pida a su farmacia que se ponga en contacto con nuestra oficina. Harland Dingwall de fax es [place] [phone].  Si tiene un asunto urgente cuando la clnica est cerrada y que no puede esperar hasta el siguiente da hbil, puede llamar/localizar a su doctor(a) al nmero que aparece a continuacin.   Por favor, tenga en cuenta que aunque hacemos todo lo posible para estar disponibles para asuntos urgentes fuera del horario de [place], no estamos disponibles las 24 horas del da, los 7 das de la [place].   Si tiene un problema urgente y no puede comunicarse con nosotros, puede optar por buscar atencin mdica  en el consultorio de su doctor(a), en una clnica privada, en un centro de atencin urgente o en una sala de emergencias.  Si tiene una emergencia mdica, por favor llame inmediatamente al 911 o vaya a  la sala de emergencias.  Nmeros de bper  - Dr. Nehemiah Massed: [phone]  - Dra. Moye: [phone]  - Dra. Nicole Kindred: [phone]  En caso de inclemencias del [place], por favor llame a Johnsie Kindred principal al [phone] para una actualizacin sobre el [place] de cualquier retraso o cierre.  Consejos para la medicacin en dermatologa: Por favor, guarde las cajas en las que vienen los medicamentos de uso tpico para ayudarle a seguir las instrucciones sobre dnde y cmo usarlos. Las farmacias generalmente imprimen las instrucciones del medicamento slo en las cajas y no directamente en los tubos del [place].   Si su medicamento es muy caro, por favor, pngase en contacto con Zigmund Daniel llamando al [phone] y presione la opcin 4 o envenos un mensaje a travs de Pharmacist, community.   No podemos decirle cul ser su copago por los medicamentos por adelantado ya que esto es diferente dependiendo de la cobertura de su seguro. Sin embargo, es posible que podamos encontrar un medicamento sustituto a Contractor un formulario para que el seguro cubra el medicamento que se considera necesario.   Si se requiere una autorizacin previa para que su compaa de seguros [place] su medicamento, por favor permtanos de 1 a 2 das hbiles para completar este proceso.  Los precios de los medicamentos varan con frecuencia dependiendo del Environmental consultant de dnde se surte la receta y alguna farmacias pueden ofrecer precios ms baratos.  El sitio web www.goodrx.com tiene cupones para medicamentos de Airline pilot. Los precios aqu no tienen en cuenta lo que podra costar con la ayuda del seguro (puede ser ms barato con su seguro), pero el sitio web puede darle el precio si no utiliz Research scientist (physical sciences).  - Puede imprimir el cupn correspondiente y llevarlo con su receta a la farmacia.  - Tambin puede pasar por nuestra oficina durante el horario de atencin regular y Charity fundraiser una tarjeta de cupones de GoodRx.  - Si necesita que su receta se enve electrnicamente a una farmacia diferente, informe a nuestra oficina a travs de MyChart de [place] o por telfono llamando al [phone] y presione la opcin 4.

## 2022-01-29 ENCOUNTER — Other Ambulatory Visit: Payer: Self-pay | Admitting: Internal Medicine

## 2022-01-29 DIAGNOSIS — Z1231 Encounter for screening mammogram for malignant neoplasm of breast: Secondary | ICD-10-CM

## 2022-02-13 IMAGING — MG MM DIGITAL SCREENING BILAT W/ TOMO AND CAD
6 of 12 series · 6 of 36 positions shown · non-contrast
Comparison: Previous exam(s).

CLINICAL DATA: Screening.

EXAM:
DIGITAL SCREENING BILATERAL MAMMOGRAM WITH TOMOSYNTHESIS AND CAD
TECHNIQUE: Bilateral screening digital craniocaudal and mediolateral oblique
mammograms were obtained. Bilateral screening digital breast
tomosynthesis was performed. The images were evaluated with
computer-aided detection.

[L MLO synth-2D (1 of 2)]
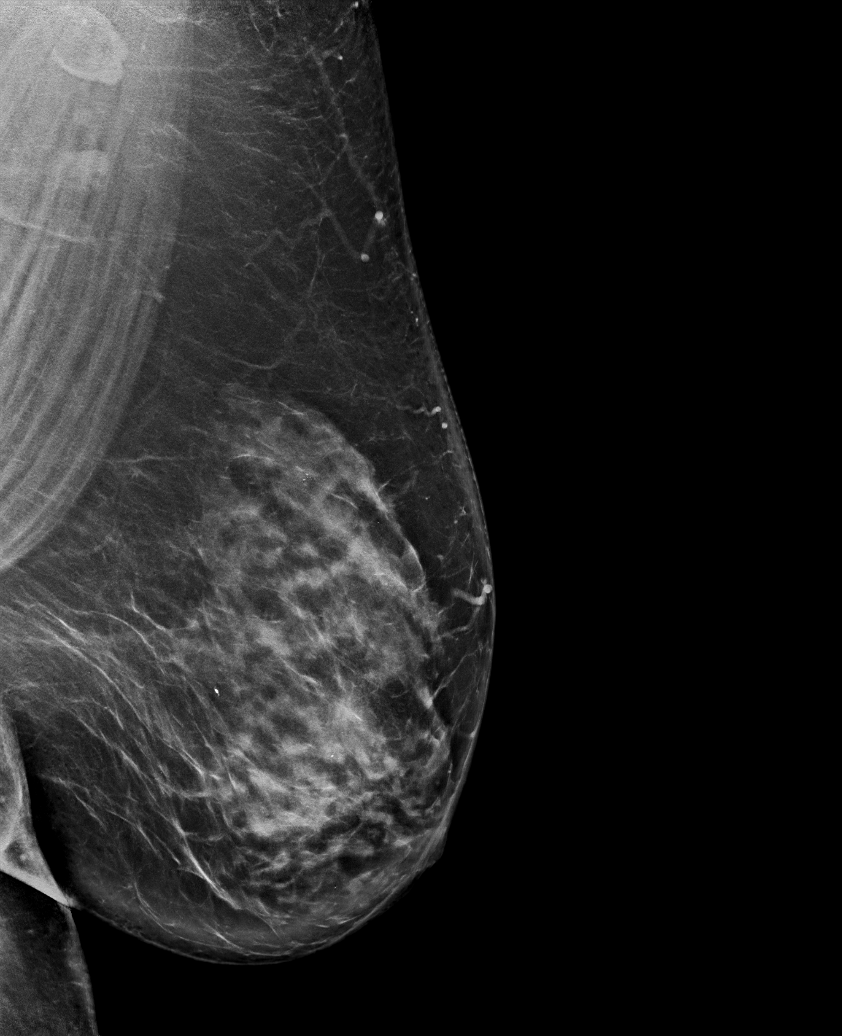

[L CC synth-2D]
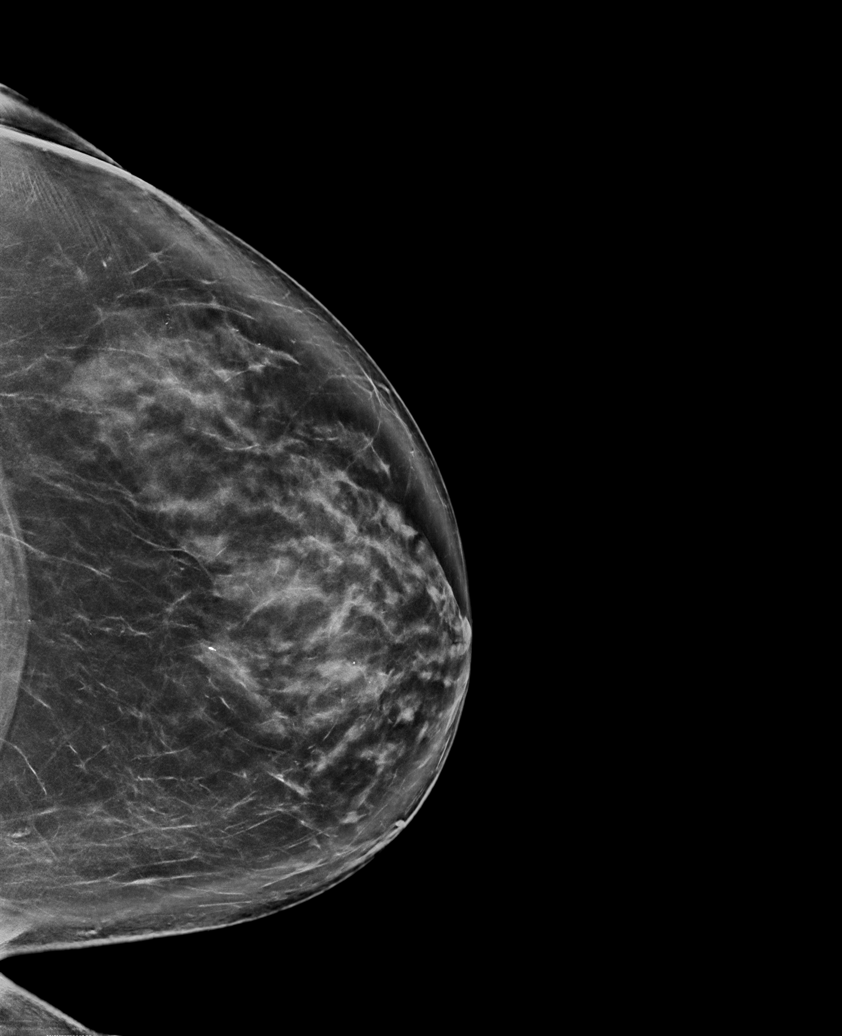

[R CC synth-2D (1 of 2)]
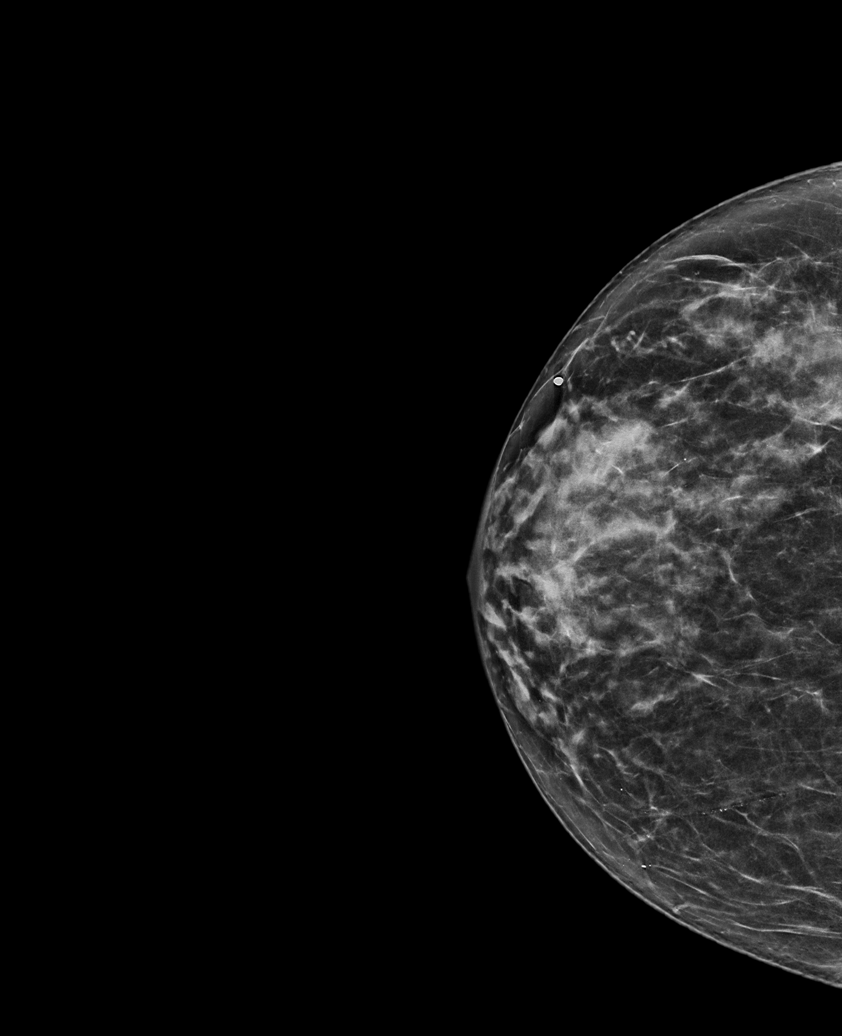

[R CC synth-2D (2 of 2)]
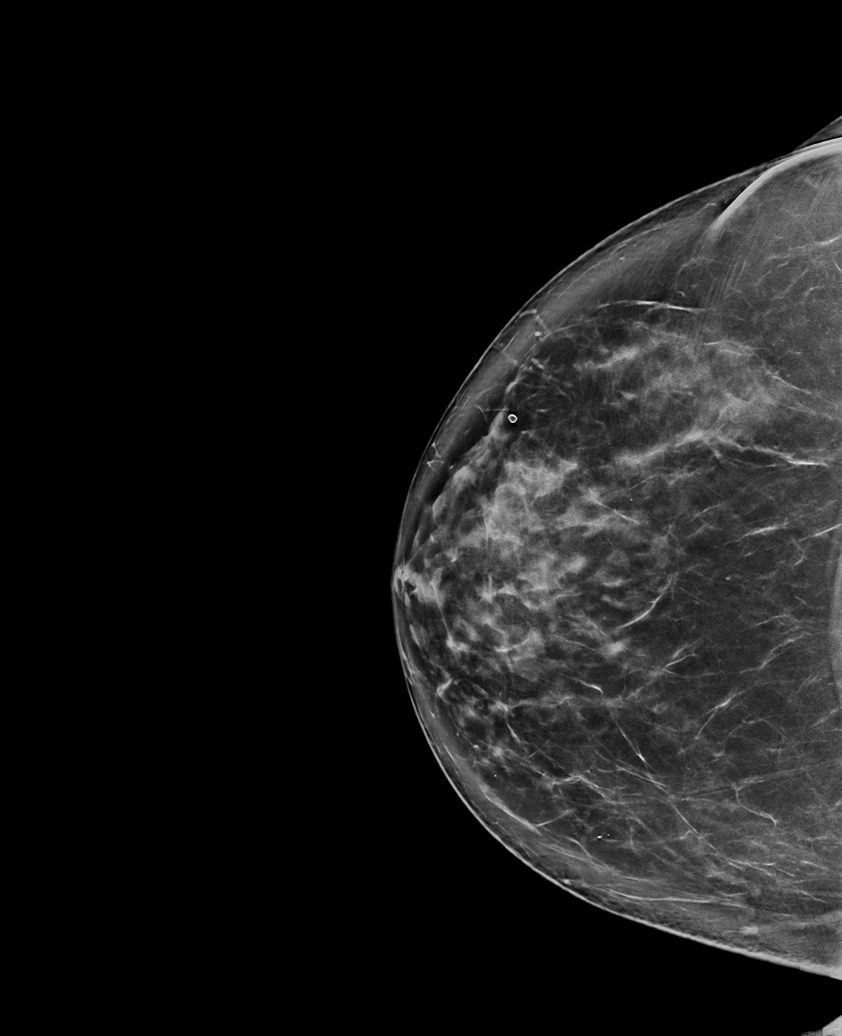

[L MLO synth-2D (2 of 2)]
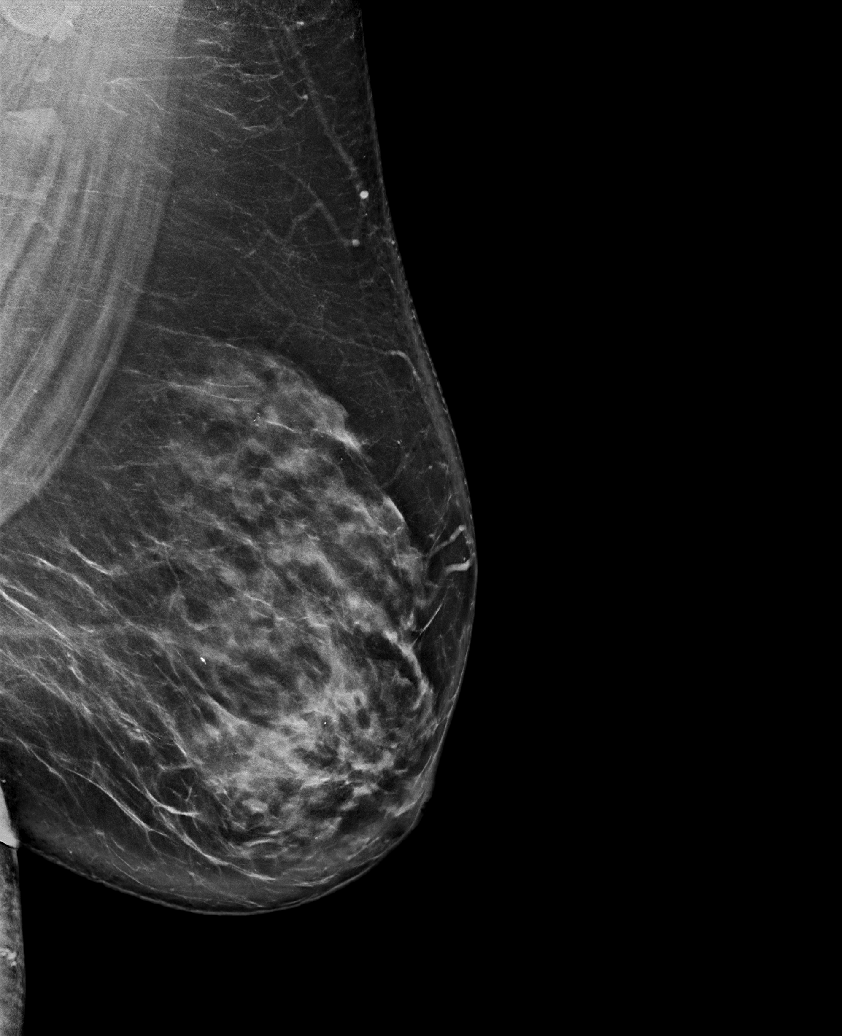

[R MLO synth-2D]
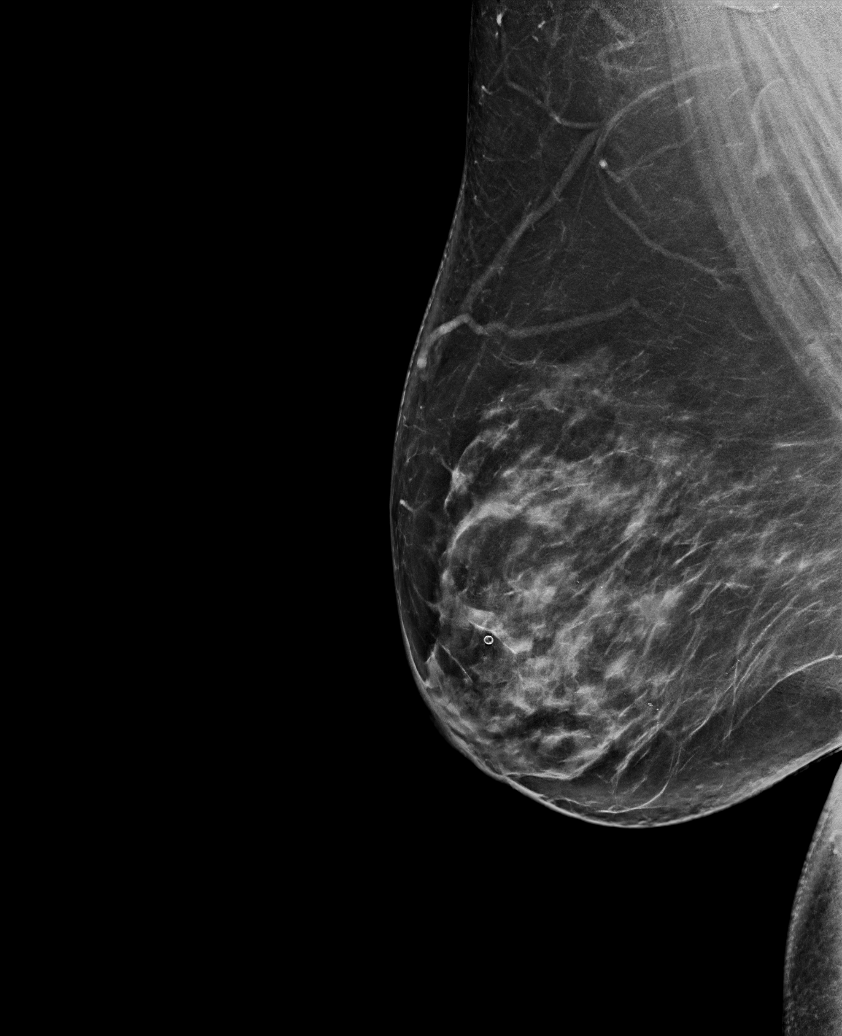

[6 of 36 positions shown; findings below may reference images not displayed]

ACR Breast Density Category c: The breast tissue is heterogeneously
dense, which may obscure small masses.
FINDINGS: There are no findings suspicious for malignancy.
IMPRESSION: No mammographic evidence of malignancy. A result letter of this
screening mammogram will be mailed directly to the patient.

RECOMMENDATION:
Screening mammogram in one year. (Code:[id number])

BI-RADS CATEGORY  1: Negative.

## 2022-02-16 ENCOUNTER — Ambulatory Visit: Payer: BC Managed Care – PPO | Admitting: Family

## 2022-02-16 ENCOUNTER — Encounter: Payer: Self-pay | Admitting: Family

## 2022-02-16 VITALS — BP 152/70 | HR 96 | Temp 98.2°F | Resp 16 | Ht 62.0 in | Wt 257.5 lb

## 2022-02-16 DIAGNOSIS — R718 Other abnormality of red blood cells: Secondary | ICD-10-CM | POA: Diagnosis not present

## 2022-02-16 DIAGNOSIS — E559 Vitamin D deficiency, unspecified: Secondary | ICD-10-CM | POA: Diagnosis not present

## 2022-02-16 DIAGNOSIS — R7989 Other specified abnormal findings of blood chemistry: Secondary | ICD-10-CM | POA: Diagnosis not present

## 2022-02-16 DIAGNOSIS — L298 Other pruritus: Secondary | ICD-10-CM | POA: Diagnosis not present

## 2022-02-16 DIAGNOSIS — T781XXA Other adverse food reactions, not elsewhere classified, initial encounter: Secondary | ICD-10-CM | POA: Diagnosis not present

## 2022-02-16 DIAGNOSIS — E871 Hypo-osmolality and hyponatremia: Secondary | ICD-10-CM | POA: Diagnosis not present

## 2022-02-16 DIAGNOSIS — Z1211 Encounter for screening for malignant neoplasm of colon: Secondary | ICD-10-CM

## 2022-02-16 DIAGNOSIS — E049 Nontoxic goiter, unspecified: Secondary | ICD-10-CM

## 2022-02-16 DIAGNOSIS — Z862 Personal history of diseases of the blood and blood-forming organs and certain disorders involving the immune mechanism: Secondary | ICD-10-CM

## 2022-02-16 DIAGNOSIS — L299 Pruritus, unspecified: Secondary | ICD-10-CM | POA: Diagnosis not present

## 2022-02-16 DIAGNOSIS — R0683 Snoring: Secondary | ICD-10-CM

## 2022-02-16 MED ORDER — PREDNISONE 20 MG PO TABS: ORAL_TABLET | ORAL | 0 refills | Status: DC

## 2022-02-16 NOTE — Assessment & Plan Note (Signed)
Food panel ordered today pending results

## 2022-02-16 NOTE — Assessment & Plan Note (Signed)
Repeat cmp pending results 

## 2022-02-16 NOTE — Patient Instructions (Addendum)
------------------------------------   A referral was placed today for colonoscopy with Dr. Allen Norris.  Please let us know if you have not heard back within 2 weeks about the referral.  ------------------------------------  I have electronically ordered thyroid ultrasound for the following location, please call to schedule.    [facility] outpatient imaging center off [address], [place] [address] Phone [phone]-  8-5 pm   ------------------------------------   Start daily vitamin D3 2000 IU once daily.   Welcome to our clinic, I am happy to have you as my new patient. I am excited to continue on this healthcare journey with you.  Stop by the lab prior to leaving today. I will notify you of your results once received.   Please keep in mind Any my chart messages you send have up to a three business day turnaround for a response.  Phone calls may take up to a one full business day turnaround for a  response.   If you need a medication refill I recommend you request it through the pharmacy as this is easiest for [place] rather than sending a message and or phone call.   Due to recent changes in healthcare laws, you may see results of your imaging and/or laboratory studies on MyChart before I have had a chance to review them.  I understand that in some cases there may be results that are confusing or concerning to you. Please understand that not all results are received at the same time and often I may need to interpret multiple results in order to provide you with the best plan of care or course of treatment. Therefore, I ask that you please give me 2 business days to thoroughly review all your results before contacting my office for clarification. Should we see a critical lab result, you will be contacted sooner.   It was a pleasure seeing you today! Please do not hesitate to reach out with any questions and or concerns.  Regards,   Eugenia Pancoast FNP-C

## 2022-02-16 NOTE — Assessment & Plan Note (Signed)
Repeat cbc ibc ferritin pending results Possible hemochromatosis? Possible cause of chronic pruritis?

## 2022-02-16 NOTE — Assessment & Plan Note (Signed)
Repeat tsh today  Order u/s thyroid for right sided thyroid tenderness

## 2022-02-16 NOTE — Assessment & Plan Note (Signed)
Referred to allergist  Lab ordered for food allergy panel  rx for prednisone 20 mg

## 2022-02-16 NOTE — Progress Notes (Signed)
 Established Patient Office Visit  Subjective:  Patient ID: Bridget Reed, female    DOB: 11/30/1960  Age: 61 y.o. MRN: [id number]  CC:  Chief Complaint  Patient presents with   Transitions Of Care    HPI Bridget Reed is here for a transition of care visit.  Prior provider was: Pt is without acute concerns.   Ongoing itching on her back mainly, at times her back of her ankles are itching. The itching comes and goes. She mixes clobetasol solution with cera ve without much relief.  Also has tried trial of diflucan, nizoral as well as kenalog. Has been going on for at least six months or more. Carries a back scratcher because at times will itch so bad.    She is taking xyzal which helps slightly. She takes it at night.   Taking claritin in the am.   What started all of this was she had possible angular chelitis, treated with diflucan, with some improvement.   07/18/19 pap : negative  Mammogram scheduled this week  Elevated tsh, thyroid was elevated.  Elevated ferritin  Lab Results  Component Value Date   FERRITIN 238 (H) 01/07/2022    chronic concerns:  HTN: taking amlodipine 2.5 mg as well as zestoretic 20-12.5mg once daily. Average at home around 120/70   Past Medical History:  Diagnosis Date   Allergy    Basal cell carcinoma 06/17/2017   right chest    GERD (gastroesophageal reflux disease)    Hypertension    Left tennis elbow    Morbid obesity (HCC) 07/13/2016    Past Surgical History:  Procedure Laterality Date   COLONOSCOPY     COLONOSCOPY WITH PROPOFOL N/A 10/14/2015   Procedure: COLONOSCOPY WITH PROPOFOL;  Surgeon: Darren Wohl, MD;  Location: [facility];  Service: Endoscopy;  Laterality: N/A;   NO PAST SURGERIES      Family History  Problem Relation Age of Onset   Heart failure Mother    Pancreatic cancer Mother    Osteopenia Mother    Arthritis Mother    Kidney cancer Father    Heart disease Father    Hypertension Father     Hypertension Brother    Atrial fibrillation Brother    Heart disease Maternal Grandmother    Leukemia Maternal Grandfather    Breast cancer Paternal Aunt 55   Colon cancer Paternal Aunt     Social History   Socioeconomic History   Marital status: Married    Spouse name: Not on file   Number of children: 0   Years of education: Not on file   Highest education level: Not on file  Occupational History    Employer: [employer]  Tobacco Use   Smoking status: Never   Smokeless tobacco: Never  Vaping Use   Vaping Use: Never used  Substance and Sexual Activity   Alcohol use: Yes    Alcohol/week: 9.0 standard drinks of alcohol    Types: 9 Glasses of wine per week    Comment: 3-4 glasses of wine about three days a week   Drug use: No   Sexual activity: Yes    Partners: Male    Birth control/protection: Post-menopausal  Other Topics Concern   Not on file  Social History Narrative   Diet: moderate.. Working on improving .   Walks 3-4 times a week.    No kids 1 step daughter married    Works [employer]   Social Determinants of Health     Financial Resource Strain: Not on file  Food Insecurity: Not on file  Transportation Needs: Not on file  Physical Activity: Not on file  Stress: Not on file  Social Connections: Not on file  Intimate Partner Violence: Not on file    Outpatient Medications Prior to Visit  Medication Sig Dispense Refill   amLODipine (NORVASC) 2.5 MG tablet TAKE 1 TABLET (2.5 MG TOTAL) BY MOUTH DAILY. FOR BLOOD PRESSURE >130/>80 90 tablet 3   levocetirizine (XYZAL) 5 MG tablet Take 1 tablet (5 mg total) by mouth daily. 30 tablet 2   lisinopril-hydrochlorothiazide (ZESTORETIC) 20-12.5 MG tablet Take 1 tablet by mouth daily. In am if BP >130/>80 02/27/20 take 2 pills in the am each day 180 tablet 3   loratadine (CLARITIN) 10 MG tablet Take 10 mg by mouth daily.     omeprazole (PRILOSEC) 40 MG capsule TAKE 1 CAPSULE (40 MG TOTAL) BY MOUTH DAILY. IN AM 30 MINUTES  BEFORE BREAKFAST 90 capsule 3   triamcinolone (NASACORT) 55 MCG/ACT AERO nasal inhaler Place 1 spray into the nose as needed.      furosemide (LASIX) 20 MG tablet Take 1 tablet (20 mg total) by mouth daily as needed. 90 tablet 0   clobetasol (TEMOVATE) 0.05 % external solution Patient to mix clobetasol solution in jar of CeraVe Cream. Apply to affected areas rash twice daily until improved. Avoid face, groin, axilla. 50 mL 1   fluconazole (DIFLUCAN) 200 MG tablet Take 1 tablet once daily for 5 days 5 tablet 0   ketoconazole (NIZORAL) 2 % cream Apply to rash under breasts once a day, twice a day for flares 60 g 5   naproxen (NAPROSYN) 500 MG tablet Take 1 tablet (500 mg total) by mouth 2 (two) times daily with a meal. 30 tablet 0   triamcinolone cream (KENALOG) 0.1 % Apply 1 Application topically 2 (two) times daily. Up to 14 days then stop 30 g 0   No facility-administered medications prior to visit.    Allergies  Allergen Reactions   Mobic [Meloxicam] Swelling    In hands and ankles and under eyes.   Polytrim [Polymyxin B-Trimethoprim] Itching    Itching of eyes after use. Patient allergic to sulfa   Azithromycin Rash   Erythromycin     Rash, GI upset   Influenza Vac Split Quad Hives    Possible allergy- rash after injection Still takes yearly flu vaccine   Shrimp [Shellfish Allergy] Other (See Comments)    Headache when eaten in excess   Sulfa Antibiotics Other (See Comments)    Unknown, can not remember       Objective:    Physical Exam Vitals reviewed.  Constitutional:      General: She is not in acute distress.    Appearance: Normal appearance. She is obese. She is not ill-appearing, toxic-appearing or diaphoretic.  HENT:     Right Ear: Tympanic membrane normal.     Left Ear: Tympanic membrane normal.     Mouth/Throat:     Mouth: Mucous membranes are moist.     Pharynx: No pharyngeal swelling.     Tonsils: No tonsillar exudate.  Eyes:     Extraocular Movements:  Extraocular movements intact.     Conjunctiva/sclera: Conjunctivae normal.     Pupils: Pupils are equal, round, and reactive to light.  Neck:     Thyroid: Thyroid tenderness (right thyroid tenderness) present. No thyroid mass.  Cardiovascular:     Rate and Rhythm: Normal rate and regular  rhythm.  Pulmonary:     Effort: Pulmonary effort is normal.     Breath sounds: Normal breath sounds.  Abdominal:     General: Abdomen is flat. Bowel sounds are normal.     Palpations: Abdomen is soft.  Musculoskeletal:        General: Normal range of motion.  Lymphadenopathy:     Cervical:     Right cervical: No superficial cervical adenopathy.    Left cervical: No superficial cervical adenopathy.  Skin:    General: Skin is warm.     Capillary Refill: Capillary refill takes less than 2 seconds.     Comments: Papular generalized rash with slight scaly texture in some spots  Neurological:     General: No focal deficit present.     Mental Status: She is alert and oriented to person, place, and time.  Psychiatric:        Mood and Affect: Mood normal.        Behavior: Behavior normal.        Thought Content: Thought content normal.        Judgment: Judgment normal.      BP (!) 152/70   Pulse 96   Temp 98.2 F (36.8 C)   Resp 16   Ht 5' 2" (1.575 m)   Wt 257 lb 8 oz (116.8 kg)   LMP 11/22/2013 (Approximate)   SpO2 97%   BMI 47.10 kg/m  Wt Readings from Last 3 Encounters:  02/16/22 257 lb 8 oz (116.8 kg)  12/30/21 255 lb 6.4 oz (115.8 kg)  03/03/21 248 lb 12.8 oz (112.9 kg)     Health Maintenance Due  Topic Date Due   Zoster Vaccines- Shingrix (1 of 2) Never done   TETANUS/TDAP  09/28/2020   COLONOSCOPY (Pts 45-49yrs Insurance coverage will need to be confirmed)  10/13/2020   MAMMOGRAM  12/17/2021    There are no preventive care reminders to display for this patient.  Lab Results  Component Value Date   TSH 4.910 (H) 01/07/2022   Lab Results  Component Value Date   WBC  8.9 01/07/2022   HGB 15.4 01/07/2022   HCT 48.0 (H) 01/07/2022   MCV 93 01/07/2022   PLT 285 01/07/2022   Lab Results  Component Value Date   NA 131 (L) 01/07/2022   K 3.9 01/07/2022   CO2 26 03/21/2020   GLUCOSE 110 (H) 01/07/2022   BUN 9 01/07/2022   CREATININE 0.54 (L) 01/07/2022   BILITOT 0.6 01/07/2022   ALKPHOS 95 01/07/2022   AST 19 01/07/2022   ALT 25 01/07/2022   PROT 6.6 01/07/2022   ALBUMIN 4.5 01/07/2022   CALCIUM 9.4 01/07/2022   EGFR 105 01/07/2022   GFR 108.38 10/27/2011   Lab Results  Component Value Date   CHOL 181 01/07/2022   Lab Results  Component Value Date   HDL 53 01/07/2022   Lab Results  Component Value Date   LDLCALC 106 (H) 01/07/2022   Lab Results  Component Value Date   TRIG 121 01/07/2022   Lab Results  Component Value Date   CHOLHDL 3.4 01/07/2022   Lab Results  Component Value Date   HGBA1C 5.6 01/07/2022      Assessment & Plan:   Problem List Items Addressed This Visit       Musculoskeletal and Integument   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    Referred to allergist  Lab ordered for food allergy panel  rx for prednisone   20 mg        Relevant Medications   predniSONE (DELTASONE) 20 MG tablet   Other Relevant Orders   Ambulatory referral to Allergy   Comprehensive metabolic panel   Lactate Dehydrogenase   Food Allergy Profile w/ Reflexes   ANA     Other   History of iron deficiency anemia    Order cbc pending results      Snoring    Consider sleep study      Elevated TSH    Repeat tsh today  Order u/s thyroid for right sided thyroid tenderness      Relevant Orders   TSH   US THYROID   Vitamin D deficiency   Hyponatremia    Repeat cmp pending results      Relevant Orders   Comprehensive metabolic panel   Elevated hematocrit    Check b12  R/o b12 def  Also ferritin elevated, consider hemochromatosis and hemoc workup , pending results (as can cause itching at times)      Relevant  Orders   Vitamin B12   Food sensitivity with gastrointestinal symptoms    Food panel ordered today pending results      Relevant Orders   Lactate Dehydrogenase   Food Allergy Profile w/ Reflexes   Elevated ferritin    Repeat cbc ibc ferritin pending results Possible hemochromatosis? Possible cause of chronic pruritis?      Relevant Orders   IBC + Ferritin   CBC with Differential   Other Visit Diagnoses     Screening for colon cancer       Relevant Orders   Ambulatory referral to Gastroenterology   Goiter       Relevant Orders   US THYROID       Meds ordered this encounter  Medications   predniSONE (DELTASONE) 20 MG tablet    Sig: Take two po qd for four days, then one po qd for three days    Dispense:  11 tablet    Refill:  0    Order Specific Question:   Supervising Provider    Answer:   BEDSOLE, AMY E [[id number]]    Follow-up: Return in about 3 weeks (around 03/09/2022) for f/u blood pressure.    Tabitha Dugal, FNP 

## 2022-02-16 NOTE — Assessment & Plan Note (Signed)
Order cbc pending results. 

## 2022-02-16 NOTE — Assessment & Plan Note (Signed)
Consider sleep study. 

## 2022-02-16 NOTE — Assessment & Plan Note (Signed)
Check b12  R/o b12 def  Also ferritin elevated, consider hemochromatosis and hemoc workup , pending results (as can cause itching at times)

## 2022-02-17 ENCOUNTER — Telehealth: Payer: Self-pay

## 2022-02-17 DIAGNOSIS — Z86010 Personal history of colonic polyps: Secondary | ICD-10-CM

## 2022-02-17 LAB — COMPREHENSIVE METABOLIC PANEL
ALT: 22 U/L (ref 0–35)
AST: 16 U/L (ref 0–37)
Albumin: 4.2 g/dL (ref 3.5–5.2)
Alkaline Phosphatase: 75 U/L (ref 39–117)
BUN: 15 mg/dL (ref 6–23)
CO2: 30 mEq/L (ref 19–32)
Calcium: 9.8 mg/dL (ref 8.4–10.5)
Chloride: 95 mEq/L — ABNORMAL LOW (ref 96–112)
Creatinine, Ser: 0.69 mg/dL (ref 0.40–1.20)
GFR: 94.53 mL/min (ref >60.00)
Glucose, Bld: 110 mg/dL — ABNORMAL HIGH (ref 70–99)
Potassium: 3.8 mEq/L (ref 3.5–5.1)
Sodium: 134 mEq/L — ABNORMAL LOW (ref 135–145)
Total Bilirubin: 0.6 mg/dL (ref 0.2–1.2)
Total Protein: 6.8 g/dL (ref 6.0–8.3)

## 2022-02-17 LAB — IBC + FERRITIN
Ferritin: 118.8 ng/mL (ref 10.0–291.0)
Iron: 70 ug/dL (ref 42–145)
Saturation Ratios: 21.1 % (ref 20.0–50.0)
TIBC: 331.8 ug/dL (ref 250.0–450.0)
Transferrin: 237 mg/dL (ref 212.0–360.0)

## 2022-02-17 LAB — FOOD ALLERGY PROFILE W/ REFLEXES
Allergen, Salmon, f41: <0.1 kU/L
Almonds: <0.1 kU/L
CLASS: 0
CLASS: 0
CLASS: 0
CLASS: 0
CLASS: 0
CLASS: 0
CLASS: 0
CLASS: 0
CLASS: 0
CLASS: 0
CLASS: 0
Cashew IgE: <0.1 kU/L
Class: 0
Class: 0
Class: 0
Class: 0
Egg White IgE: <0.1 kU/L
Fish Cod: <0.1 kU/L
Hazelnut: <0.1 kU/L
Milk IgE: <0.1 kU/L
Peanut IgE: <0.1 kU/L
Scallop IgE: <0.1 kU/L
Sesame Seed f10: <0.1 kU/L
Shrimp IgE: <0.1 kU/L
Soybean IgE: <0.1 kU/L
Tuna IgE: <0.1 kU/L
Walnut: <0.1 kU/L
Wheat IgE: <0.1 kU/L

## 2022-02-17 LAB — CBC WITH DIFFERENTIAL/PLATELET
Basophils Absolute: 0.1 10*3/uL (ref 0.0–0.1)
Basophils Relative: 1 % (ref 0.0–3.0)
Eosinophils Absolute: 0.4 10*3/uL (ref 0.0–0.7)
Eosinophils Relative: 3.9 % (ref 0.0–5.0)
HCT: 42 % (ref 36.0–46.0)
Hemoglobin: 14.6 g/dL (ref 12.0–15.0)
Lymphocytes Relative: 14.4 % (ref 12.0–46.0)
Lymphs Abs: 1.6 10*3/uL (ref 0.7–4.0)
MCHC: 34.8 g/dL (ref 30.0–36.0)
MCV: 89.7 fl (ref 78.0–100.0)
Monocytes Absolute: 1.1 10*3/uL — ABNORMAL HIGH (ref 0.1–1.0)
Monocytes Relative: 9.8 % (ref 3.0–12.0)
Neutro Abs: 8 10*3/uL — ABNORMAL HIGH (ref 1.4–7.7)
Neutrophils Relative %: 70.9 % (ref 43.0–77.0)
Platelets: 276 10*3/uL (ref 150.0–400.0)
RBC: 4.69 Mil/uL (ref 3.87–5.11)
RDW: 13.7 % (ref 11.5–15.5)
WBC: 11.3 10*3/uL — ABNORMAL HIGH (ref 4.0–10.5)

## 2022-02-17 LAB — TSH: TSH: 3.73 u[IU]/mL (ref 0.35–5.50)

## 2022-02-17 LAB — INTERPRETATION:

## 2022-02-17 LAB — VITAMIN B12: Vitamin B-12: 276 pg/mL (ref 211–911)

## 2022-02-17 MED ORDER — NA SULFATE-K SULFATE-MG SULF 17.5-3.13-1.6 GM/177ML PO SOLN: 1.0000 | Freq: Once | ORAL | 0 refills | Status: AC

## 2022-02-17 NOTE — Telephone Encounter (Signed)
Gastroenterology Pre-Procedure Review  Request Date: 04/07/22 Requesting Physician: Dr. Allen Norris  PATIENT REVIEW QUESTIONS: The patient responded to the following health history questions as indicated:    1. Are you having any GI issues? yes (taking align probiotics for gut issues) 2. Do you have a personal history of Polyps? Yes last colonoscopy performed by Dr. Allen Norris 10/14/15 3. Do you have a family history of Colon Cancer or Polyps? yes (paternal aunt colon cancer) 4. Diabetes Mellitus? no 5. Joint replacements in the past 12 months?no 6. Major health problems in the past 3 months?no 7. Any artificial heart valves, MVP, or defibrillator?no    MEDICATIONS & ALLERGIES:    Patient reports the following regarding taking any anticoagulation/antiplatelet therapy:   Plavix, Coumadin, Eliquis, Xarelto, Lovenox, Pradaxa, Brilinta, or Effient? no Aspirin? no  Patient confirms/reports the following medications:  Current Outpatient Medications  Medication Sig Dispense Refill   amLODipine (NORVASC) 2.5 MG tablet TAKE 1 TABLET (2.5 MG TOTAL) BY MOUTH DAILY. FOR BLOOD PRESSURE >130/>80 90 tablet 3   levocetirizine (XYZAL) 5 MG tablet Take 1 tablet (5 mg total) by mouth daily. 30 tablet 2   lisinopril-hydrochlorothiazide (ZESTORETIC) 20-12.5 MG tablet Take 1 tablet by mouth daily. In am if BP >130/>80 02/27/20 take 2 pills in the am each day 180 tablet 3   loratadine (CLARITIN) 10 MG tablet Take 10 mg by mouth daily.     omeprazole (PRILOSEC) 40 MG capsule TAKE 1 CAPSULE (40 MG TOTAL) BY MOUTH DAILY. IN AM 30 MINUTES BEFORE BREAKFAST 90 capsule 3   predniSONE (DELTASONE) 20 MG tablet Take two po qd for four days, then one po qd for three days 11 tablet 0   triamcinolone (NASACORT) 55 MCG/ACT AERO nasal inhaler Place 1 spray into the nose as needed.      No current facility-administered medications for this visit.    Patient confirms/reports the following allergies:  Allergies  Allergen Reactions    Mobic [Meloxicam] Swelling    In hands and ankles and under eyes.   Polytrim [Polymyxin B-Trimethoprim] Itching    Itching of eyes after use. Patient allergic to sulfa   Azithromycin Rash   Erythromycin     Rash, GI upset   Influenza Vac Split Quad Hives    Possible allergy- rash after injection Still takes yearly flu vaccine   Shrimp [Shellfish Allergy] Other (See Comments)    Headache when eaten in excess   Sulfa Antibiotics Other (See Comments)    Unknown, can not remember    No orders of the defined types were placed in this encounter.   AUTHORIZATION INFORMATION Primary Insurance: 1D#: Group #:  Secondary Insurance: 1D#: Group #:  SCHEDULE INFORMATION: Date: 04/07/22 Time: Location: [facility]

## 2022-02-18 LAB — LACTATE DEHYDROGENASE: LDH: 187 U/L (ref 120–250)

## 2022-02-18 LAB — ANA: Anti Nuclear Antibody (ANA): NEGATIVE

## 2022-02-18 NOTE — Progress Notes (Signed)
White blood cell slightly elevated we will repeat at f/u visit in october  Sodium is a tiny bit low but improved from 1 month ago nothing to do here we will just continue to monitor  No allergies seen on the food allergy testing results. Further recommend allergist consult, referral already placed.  Thyroid is still trending on upper end, let's get thyroid ultrasound as set up. We will await ultrasound.   B12 lower than normal, suggest otc b12 1000 mcg once daily if not already taking.  Ferritin back to normal.

## 2022-02-19 ENCOUNTER — Ambulatory Visit
Admission: RE | Admit: 2022-02-19 | Discharge: 2022-02-19 | Disposition: A | Discharge status: Home / Self Care | Payer: BC Managed Care – PPO | Source: Ambulatory Visit | Attending: Internal Medicine | Admitting: Internal Medicine

## 2022-02-19 DIAGNOSIS — Z1231 Encounter for screening mammogram for malignant neoplasm of breast: Secondary | ICD-10-CM | POA: Diagnosis not present

## 2022-02-22 DIAGNOSIS — R7309 Other abnormal glucose: Secondary | ICD-10-CM | POA: Diagnosis not present

## 2022-02-24 ENCOUNTER — Ambulatory Visit (INDEPENDENT_AMBULATORY_CARE_PROVIDER_SITE_OTHER): Payer: BC Managed Care – PPO | Admitting: Physician Assistant

## 2022-02-24 ENCOUNTER — Ambulatory Visit
Admission: RE | Admit: 2022-02-24 | Discharge: 2022-02-24 | Disposition: A | Discharge status: Home / Self Care | Payer: BC Managed Care – PPO | Source: Ambulatory Visit | Attending: Family | Admitting: Family

## 2022-02-24 VITALS — BP 154/70 | HR 83 | Temp 97.5°F | Resp 16

## 2022-02-24 DIAGNOSIS — R7989 Other specified abnormal findings of blood chemistry: Secondary | ICD-10-CM | POA: Insufficient documentation

## 2022-02-24 DIAGNOSIS — E049 Nontoxic goiter, unspecified: Secondary | ICD-10-CM | POA: Diagnosis not present

## 2022-02-24 DIAGNOSIS — I10 Essential (primary) hypertension: Secondary | ICD-10-CM

## 2022-02-24 NOTE — Progress Notes (Signed)
[facility] [address], [place] [address]   Office Visit Note  Patient Name: Bridget Reed Date of Birth [id number]  Medical Record number [id number]  Date of Service: 02/24/2022  Chief Complaint  Patient presents with   Dizziness     61 y/o F presents to the clinic for c/o feeling dizzy earlier today. No recent head or neck trauma. No recent open water activities. She measured her BP earlier today with a reading of 201/101 on her wrist. She has h/o HTN for which she takes Amlodipine 2.'5mg'$  and Lisinopril/HCTZ 20/12.5 mg. Pt is compliant with her medicines. She checks her BP with a wrist monitor with her systolic ranges in [id number] and diastolic range between 06-26R. She is following up with her PCP and will report her BP readings on her next follow up. She is scheduled for a thyroid u/s later today because of consistently normal high thyroid level. +family(father) of HTN. She does admit to poor diet and lack of exercise due to heel spur of the R foot. She denies nausea, vomiting, CP or SOB.   Dizziness Pertinent negatives include no headaches.      Current Medication:  Outpatient Encounter Medications as of 02/24/2022  Medication Sig   amLODipine (NORVASC) 2.5 MG tablet TAKE 1 TABLET (2.5 MG TOTAL) BY MOUTH DAILY. FOR BLOOD PRESSURE >130/>80   levocetirizine (XYZAL) 5 MG tablet Take 1 tablet (5 mg total) by mouth daily.   lisinopril-hydrochlorothiazide (ZESTORETIC) 20-12.5 MG tablet Take 1 tablet by mouth daily. In am if BP >130/>80 02/27/20 take 2 pills in the am each day   loratadine (CLARITIN) 10 MG tablet Take 10 mg by mouth daily.   omeprazole (PRILOSEC) 40 MG capsule TAKE 1 CAPSULE (40 MG TOTAL) BY MOUTH DAILY. IN AM 30 MINUTES BEFORE BREAKFAST   predniSONE (DELTASONE) 20 MG tablet Take two po qd for four days, then one po qd for three days   triamcinolone (NASACORT) 55 MCG/ACT AERO nasal inhaler Place 1 spray into the nose as needed.    No  facility-administered encounter medications on file as of 02/24/2022.      Medical History: Past Medical History:  Diagnosis Date   Allergy    Basal cell carcinoma 06/17/2017   right chest    GERD (gastroesophageal reflux disease)    Hypertension    Left tennis elbow    Morbid obesity ([place]) 07/13/2016     Vital Signs: BP (!) 154/70 (BP Location: Left Arm, Patient Position: Sitting, Cuff Size: Large)   Pulse 83   Temp (!) 97.5 F (36.4 C) (Tympanic)   Resp 16   LMP 11/22/2013 (Approximate)   SpO2 99%    Review of Systems  Constitutional: Negative.   HENT: Negative.    Respiratory: Negative.    Cardiovascular: Negative.   Neurological:  Positive for dizziness. Negative for headaches.    Physical Exam Constitutional:      Appearance: Normal appearance.  HENT:     Right Ear: Tympanic membrane, ear canal and external ear normal. There is no impacted cerumen.     Left Ear: Tympanic membrane, ear canal and external ear normal. There is no impacted cerumen.     Nose: Nose normal.  Eyes:     Extraocular Movements: Extraocular movements intact.  Cardiovascular:     Rate and Rhythm: Normal rate and regular rhythm.     Pulses: Normal pulses.  Pulmonary:     Effort: Pulmonary effort is normal.  Breath sounds: Normal breath sounds.  Neurological:     Mental Status: She is alert and oriented to person, place, and time.  Psychiatric:        Mood and Affect: Mood normal.        Behavior: Behavior normal.        Judgment: Judgment normal.       Assessment/Plan:   ICD-10-CM   1. Primary hypertension  I10       Reviewed elevated blood pressure readings with patient.  Increase fluids Continue to monitor BP at home and f/u with PCP as scheduled. Pt was given information regarding [facility] for HTN and recommended to contact them for HTN. She verbalized understanding and in agreement. If her readings continue to stay elevated then f/u with PCP sooner or if new onset of  headaches, dizziness, vision changes then go to ER or call 911. She verbalized understanding and in agreement.   General Counseling: Bridget Reed understanding of the findings of todays visit and agrees with plan of treatment. I have discussed any further diagnostic evaluation that may be needed or ordered today. We also reviewed her medications today. she has been encouraged to call the office with any questions or concerns that should arise related to todays visit.    Time spent:[address] [place], [place] Physician Assistant

## 2022-02-25 ENCOUNTER — Encounter: Payer: Self-pay | Admitting: Family

## 2022-02-25 DIAGNOSIS — E039 Hypothyroidism, unspecified: Secondary | ICD-10-CM | POA: Insufficient documentation

## 2022-02-25 DIAGNOSIS — L299 Pruritus, unspecified: Secondary | ICD-10-CM

## 2022-02-25 MED ORDER — HYDROXYZINE PAMOATE 25 MG PO CAPS: 25.0000 mg | ORAL_CAPSULE | Freq: Three times a day (TID) | ORAL | 0 refills | Status: DC | PRN

## 2022-02-25 MED ORDER — LEVOTHYROXINE SODIUM 50 MCG PO TABS: 50.0000 ug | ORAL_TABLET | Freq: Every day | ORAL | 1 refills | Status: DC

## 2022-03-10 ENCOUNTER — Encounter: Payer: Self-pay | Admitting: Family

## 2022-03-10 ENCOUNTER — Ambulatory Visit: Payer: BC Managed Care – PPO | Admitting: Family

## 2022-03-10 VITALS — BP 136/60 | HR 94 | Temp 98.6°F | Resp 16 | Ht 62.0 in | Wt 258.1 lb

## 2022-03-10 DIAGNOSIS — E039 Hypothyroidism, unspecified: Secondary | ICD-10-CM | POA: Diagnosis not present

## 2022-03-10 DIAGNOSIS — T781XXA Other adverse food reactions, not elsewhere classified, initial encounter: Secondary | ICD-10-CM | POA: Diagnosis not present

## 2022-03-10 DIAGNOSIS — L299 Pruritus, unspecified: Secondary | ICD-10-CM | POA: Diagnosis not present

## 2022-03-10 DIAGNOSIS — L298 Other pruritus: Secondary | ICD-10-CM

## 2022-03-10 MED ORDER — HYDROXYZINE PAMOATE 25 MG PO CAPS: 25.0000 mg | ORAL_CAPSULE | Freq: Three times a day (TID) | ORAL | 0 refills | Status: DC | PRN

## 2022-03-10 MED ORDER — PREDNISONE 20 MG PO TABS: 20.0000 mg | ORAL_TABLET | Freq: Every day | ORAL | 0 refills | Status: AC

## 2022-03-10 NOTE — Assessment & Plan Note (Signed)
rx prednisone 20 mg po qd for five days hydroxyxine 25 prn trial F/u with allergist as scheduled Trial without gluten x 2 weeks  continue with claritin in am xyzal in pm triamincinolone cream prn

## 2022-03-10 NOTE — Assessment & Plan Note (Signed)
Repeat tsh in two weeks, pending results Continue levothyroxine 50 mcg once daily

## 2022-03-10 NOTE — Assessment & Plan Note (Signed)
Celiac panel negative D/w pt on gluten free diet, and try to avoid wheat barley rye x 2 weeks to see if improvement

## 2022-03-10 NOTE — Patient Instructions (Signed)
  Trial hydroxyxine.  Prednisone again. F/u with allergist Two week trial without guten (wheat barley rye)   Regards,   Macklin Jacquin FNP-C

## 2022-03-10 NOTE — Progress Notes (Signed)
Established Patient Office Visit  Subjective:  Patient ID: Bridget Reed, female    DOB: [birth year]  Age: 61 y.o. MRN: [id number]  CC:  Chief Complaint  Patient presents with   Rash    3 weeks follow up    HPI Bridget Reed is here today for follow up.   Pt still with ongoing concerns for itching all of the time, rash on chest.  Did do trial prednisone which helped but came back after stopping.  Notices flare ups worse with eating yeasty rolls and or breads.  Non celiac august 2023. Does notice bloating when she eats gluten as well   Itching now has been ongoing for about 6-8 months.   Vitamin b12 def: pt is not complaint with daily otc administration however she wants to try to be more consistent.  Lab Results  Component Value Date   [id number] 997 02/16/2022   Elevation wbc , allergy type symptoms last visit not so much anymore.  Lab Results  Component Value Date   WBC 11.3 (H) 02/16/2022   HGB 14.6 02/16/2022   HCT 42.0 02/16/2022   MCV 89.7 02/16/2022   PLT 276.0 02/16/2022      Past Medical History:  Diagnosis Date   Allergy    Basal cell carcinoma 06/17/2017   right chest    GERD (gastroesophageal reflux disease)    Hypertension    Left tennis elbow    Morbid obesity ([place]) 07/13/2016    Past Surgical History:  Procedure Laterality Date   COLONOSCOPY     COLONOSCOPY WITH PROPOFOL N/A 10/14/2015   Procedure: COLONOSCOPY WITH PROPOFOL;  Surgeon: Lucilla Lame, MD;  Location: [facility];  Service: Endoscopy;  Laterality: N/A;   NO PAST SURGERIES      Family History  Problem Relation Age of Onset   Heart failure Mother    Pancreatic cancer Mother    Osteopenia Mother    Arthritis Mother    Kidney cancer Father    Heart disease Father    Hypertension Father    Hypertension Brother    Atrial fibrillation Brother    Heart disease Maternal Grandmother    Leukemia Maternal Grandfather    Breast cancer Paternal Aunt 68   Colon cancer  Paternal Aunt     Social History   Socioeconomic History   Marital status: Married    Spouse name: Not on file   Number of children: 0   Years of education: Not on file   Highest education level: Not on file  Occupational History    Employer: [employer]  Tobacco Use   Smoking status: Never   Smokeless tobacco: Never  Vaping Use   Vaping Use: Never used  Substance and Sexual Activity   Alcohol use: Yes    Alcohol/week: 9.0 standard drinks of alcohol    Types: 9 Glasses of wine per week    Comment: 3-4 glasses of wine about three days a week   Drug use: No   Sexual activity: Yes    Partners: Male    Birth control/protection: Post-menopausal  Other Topics Concern   Not on file  Social History Narrative   Diet: moderate.. Working on improving .   Walks 3-4 times a week.    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 FOR BLOOD PRESSURE >130/>80 90 tablet 3   levocetirizine (XYZAL) 5 MG tablet Take 1 tablet (5 mg total) by mouth daily. 30 tablet 2   levothyroxine (SYNTHROID) 50 MCG tablet Take 1 tablet (50 mcg total) by mouth daily. 30 tablet 1   lisinopril-hydrochlorothiazide (ZESTORETIC) 20-12.5 MG tablet Take 1 tablet by mouth daily. In am if BP >130/>80 02/27/20 take 2 pills in the am each day 180 tablet 3   loratadine (CLARITIN) 10 MG tablet Take 10 mg by mouth daily.     omeprazole (PRILOSEC) 40 MG capsule TAKE 1 CAPSULE (40 MG TOTAL) BY MOUTH DAILY. IN AM 30 MINUTES BEFORE BREAKFAST 90 capsule 3   triamcinolone (NASACORT) 55 MCG/ACT AERO nasal  inhaler Place 1 spray into the nose as needed.      hydrOXYzine (VISTARIL) 25 MG capsule Take 1 capsule (25 mg total) by mouth every 8 (eight) hours as needed. 30 capsule 0   predniSONE (DELTASONE) 20 MG tablet Take two po qd for four days, then one po qd for three days 11 tablet 0   No facility-administered medications prior to visit.    Allergies  Allergen Reactions   Mobic [Meloxicam] Swelling    In hands and ankles and under eyes.   Polytrim [Polymyxin B-Trimethoprim] Itching    Itching of eyes after use. Patient allergic to sulfa   Azithromycin Rash   Erythromycin     Rash, GI upset   Influenza Vac Split Quad Hives    Possible allergy- rash after injection Still takes yearly flu vaccine   Shrimp [Shellfish Allergy] Other (See Comments)    Headache when eaten in excess   Sulfa Antibiotics Other (See Comments)    Unknown, can not remember         Objective:    Physical Exam Constitutional:      General: She is not in acute distress.    Appearance: Normal appearance. She is obese. She is not ill-appearing, toxic-appearing or diaphoretic.  Neck:     Thyroid: No thyroid mass, thyromegaly or thyroid tenderness.  Skin:    Findings: Rash (macularpaplular rash with tiny blister like lesions scattered on base of erythema on left upper chest, bil lower extremities in localized areas) present.  Neurological:     Mental Status: She is alert.      BP 136/60   Pulse 94   Temp 98.6 F (37 C)   Resp 16   Ht '5\' 2"'  (1.575 m)   Wt 258 lb 2 oz (117.1 kg)   LMP 11/22/2013 (Approximate)   SpO2 99%   BMI 47.21 kg/m  Wt Readings from Last 3 Encounters:  03/10/22 258 lb 2 oz (117.1 kg)  02/16/22 257 lb 8 oz (116.8 kg)  12/30/21 255 lb 6.4 oz (115.8 kg)     Health Maintenance Due  Topic Date Due   Zoster Vaccines- Shingrix (1 of 2) Never done   TETANUS/TDAP  09/28/2020   COLONOSCOPY (Pts 45-62yr Insurance coverage will need to be confirmed)  10/13/2020   COVID-19  Vaccine (5 - Pfizer risk series) 04/21/2021    There are no preventive care reminders to display for this patient.  Lab Results  Component Value Date  TSH 3.73 02/16/2022   Lab Results  Component Value Date   WBC 11.3 (H) 02/16/2022   HGB 14.6 02/16/2022   HCT 42.0 02/16/2022   MCV 89.7 02/16/2022   PLT 276.0 02/16/2022   Lab Results  Component Value Date   NA 134 (L) 02/16/2022   K 3.8 02/16/2022   CO2 30 02/16/2022   GLUCOSE 110 (H) 02/16/2022   BUN 15 02/16/2022   CREATININE 0.69 02/16/2022   BILITOT 0.6 02/16/2022   ALKPHOS 75 02/16/2022   AST 16 02/16/2022   ALT 22 02/16/2022   PROT 6.8 02/16/2022   ALBUMIN 4.2 02/16/2022   CALCIUM 9.8 02/16/2022   EGFR 105 01/07/2022   GFR 94.53 02/16/2022   Lab Results  Component Value Date   CHOL 181 01/07/2022   Lab Results  Component Value Date   HDL 53 01/07/2022   Lab Results  Component Value Date   LDLCALC 106 (H) 01/07/2022   Lab Results  Component Value Date   TRIG 121 01/07/2022   Lab Results  Component Value Date   CHOLHDL 3.4 01/07/2022   Lab Results  Component Value Date   HGBA1C 5.6 01/07/2022      Assessment & Plan:   Problem List Items Addressed This Visit       Endocrine   Acquired hypothyroidism - Primary    Repeat tsh in two weeks, pending results Continue levothyroxine 50 mcg once daily      Relevant Orders   TSH   Thyroid peroxidase antibody     Musculoskeletal and Integument   Chronic pruritic rash in adult    rx prednisone 20 mg po qd for five days hydroxyxine 25 prn trial F/u with allergist as scheduled Trial without gluten x 2 weeks  continue with claritin in am xyzal in pm triamincinolone cream prn      Pruritus   Relevant Medications   hydrOXYzine (VISTARIL) 25 MG capsule   predniSONE (DELTASONE) 20 MG tablet   Other Relevant Orders   CBC with Differential   TSH   Thyroid peroxidase antibody     Other   Food sensitivity with gastrointestinal symptoms     Celiac panel negative D/w pt on gluten free diet, and try to avoid wheat barley rye x 2 weeks to see if improvement       Meds ordered this encounter  Medications   hydrOXYzine (VISTARIL) 25 MG capsule    Sig: Take 1 capsule (25 mg total) by mouth every 8 (eight) hours as needed.    Dispense:  30 capsule    Refill:  0    Order Specific Question:   Supervising Provider    Answer:   BEDSOLE, AMY E [[id number]]   predniSONE (DELTASONE) 20 MG tablet    Sig: Take 1 tablet (20 mg total) by mouth daily with breakfast for 5 days.    Dispense:  5 tablet    Refill:  0    Order Specific Question:   Supervising Provider    Answer:   Diona Browner, AMY E [[id number]]    Follow-up: Return in about 3 months (around 06/10/2022) for f/u thyroid.    Eugenia Pancoast, FNP

## 2022-03-16 ENCOUNTER — Ambulatory Visit: Payer: BC Managed Care – PPO | Admitting: Internal Medicine

## 2022-03-16 ENCOUNTER — Encounter: Payer: Self-pay | Admitting: Internal Medicine

## 2022-03-16 VITALS — BP 168/78 | HR 115 | Temp 98.3°F | Resp 18 | Ht 61.0 in | Wt 257.2 lb

## 2022-03-16 DIAGNOSIS — H1013 Acute atopic conjunctivitis, bilateral: Secondary | ICD-10-CM

## 2022-03-16 DIAGNOSIS — L253 Unspecified contact dermatitis due to other chemical products: Secondary | ICD-10-CM

## 2022-03-16 DIAGNOSIS — R21 Rash and other nonspecific skin eruption: Secondary | ICD-10-CM

## 2022-03-16 DIAGNOSIS — J310 Chronic rhinitis: Secondary | ICD-10-CM | POA: Diagnosis not present

## 2022-03-16 MED ORDER — AZELASTINE HCL 0.1 % NA SOLN: NASAL | 5 refills | Status: DC

## 2022-03-16 MED ORDER — HYDROXYZINE HCL 25 MG PO TABS: 25.0000 mg | ORAL_TABLET | Freq: Every evening | ORAL | 5 refills | Status: AC | PRN

## 2022-03-16 MED ORDER — TRIAMCINOLONE ACETONIDE 55 MCG/ACT NA AERO: 2.0000 | INHALATION_SPRAY | Freq: Every day | NASAL | 5 refills | Status: DC

## 2022-03-16 NOTE — Patient Instructions (Addendum)
Rhinitis: - Use nasal saline rinses before nose sprays such as with Neilmed Sinus Rinse.  Use distilled water.   - Use Nasacort 2 sprays each nostril daily. Aim upward and outward. - Use Azelastine 1-2 sprays twice daily.  Aim upward and outward.    Rash - Unclear reason for this. Could be drug reaction vs contact dermatitis to an unknown product. - Start Allegra '180mg'$  twice daily.  Increase it to '360mg'$  twice daily if needed. Stop Claritin/Xyzal. - Start Hydroxyzine '25mg'$  at nighttime as needed.  It can make you sleepy. - Recommend setting up for patch testing.  Follow up: Patch testing in 2-3 weeks. Follow up in 6 weeks.

## 2022-03-16 NOTE — Progress Notes (Signed)
NEW PATIENT  Date of Service/Encounter:  03/16/22  Consult requested by: Eugenia Pancoast, FNP   Subjective:   Bridget Reed (DOB: Jan 15, 1961) is a 61 y.o. female who presents to the clinic on 03/16/2022 with a chief complaint of Pruritus (Is currently on steriods has about a day or two left. She has horrible itching that has been going for 6-8 months. Has cut several food out of her diet.Chest, back ,thighs, ankles. It bothers her when she sleeps. ), Allergic Rhinitis  (Did allergy shots in her early 20's. Itchy eyes, runny nose), and Allergy Testing (Shellfish, strawberries ) .    History obtained from: chart review and patient.   Rash Reports onset of a rash about 6-8 months ago.  It is mostly diffuse redness with small bumps.  It is itchy.  It is present on her back, neck, shoulders and legs.  She saw a Dermatologist but they looked at a different rash that was on her legs and told her possibly it was bug bites.  They also considered sending her to an Allergist; this visit was about 8 weeks ago.  She is taking Claritin daily and Xyzal nightly.  She also has PRN hydroxyzine but has not used it.  Last use of any anti-histamine was Friday AM.  She also has received multiple prednisone packs for this rash; it helps with the itching and the rash but then it reoccurs once she gets off of it.  No new medications, recent illness or new products.  She has not history of skin malignancy.  She is on Lisinopril-HCTZ and has been on it for years and started Amlodipine about 2 years.  She is getting sleepy with Xyzal/Claritin combination.   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  She was improving with topical steroids (Clobetasol and Cerave cream and Xyzal) so it was thought it might be more clinically consistent with bite reaction.   Rhinitis:  Started since 20s Symptoms include:  nasal congestion, rhinorrhea, post nasal drainage, sneezing, watery eyes, and itchy eyes  Occurs year-round Potential triggers: pollen Treatments tried: Claritin PRN; she was previously on AIT for 3-4 years but this was in her early 22s Previous allergy testing: yes History of reflux/heartburn: yes; omeprazole PRN History of chronic sinusitis or sinus surgery: no   Past Medical History: Past Medical History:  Diagnosis Date   Allergy    Basal cell carcinoma 06/17/2017   right chest    GERD (gastroesophageal reflux disease)    Hypertension    Left tennis elbow    Morbid obesity ([place]) 07/13/2016   Past Surgical History: Past Surgical History:  Procedure Laterality Date   COLONOSCOPY     COLONOSCOPY WITH PROPOFOL N/A 10/14/2015   Procedure: COLONOSCOPY WITH PROPOFOL;  Surgeon: Lucilla Lame, MD;  Location: [place];  Service: Endoscopy;  Laterality: N/A;   NO PAST SURGERIES      Family History: Family History  Problem Relation Age of Onset   Heart failure Mother    Pancreatic cancer Mother    Osteopenia Mother    Arthritis Mother    Kidney cancer Father    Heart disease Father    Hypertension Father    Hypertension Brother    Atrial fibrillation Brother    Heart disease Maternal Grandmother    Leukemia Maternal Grandfather    Breast cancer Paternal Aunt 50   Colon cancer Paternal Aunt  Social History:  Lives in a 12 year house Flooring in bedroom: carpet Pets: dog Tobacco use/exposure: none Job: administrative  Medication List:  Allergies as of 03/16/2022       Reactions   Mobic [meloxicam] Swelling   In hands and ankles and under eyes.   Polytrim [polymyxin B-trimethoprim] Itching   Itching of eyes after use. Patient allergic to sulfa   Azithromycin Rash   Erythromycin    Rash, GI upset   Influenza Vac Split Quad Hives   Possible allergy- rash after injection Still takes yearly flu vaccine   Shrimp [shellfish Allergy] Other (See Comments)    Headache when eaten in excess   Sulfa Antibiotics Other (See Comments)   Unknown, can not remember        Medication List        Accurate as of March 16, 2022 12:35 PM. If you have any questions, ask your nurse or doctor.          amLODipine 2.5 MG tablet Commonly known as: NORVASC TAKE 1 TABLET (2.5 MG TOTAL) BY MOUTH DAILY. FOR BLOOD PRESSURE >130/>80   hydrOXYzine 25 MG capsule Commonly known as: VISTARIL Take 1 capsule (25 mg total) by mouth every 8 (eight) hours as needed.   levocetirizine 5 MG tablet Commonly known as: XYZAL Take 1 tablet (5 mg total) by mouth daily.   levothyroxine 50 MCG tablet Commonly known as: SYNTHROID Take 1 tablet (50 mcg total) by mouth daily.   lisinopril-hydrochlorothiazide 20-12.5 MG tablet Commonly known as: ZESTORETIC Take 1 tablet by mouth daily. In am if BP >130/>80 02/27/20 take 2 pills in the am each day   loratadine 10 MG tablet Commonly known as: CLARITIN Take 10 mg by mouth daily.   omeprazole 40 MG capsule Commonly known as: PRILOSEC TAKE 1 CAPSULE (40 MG TOTAL) BY MOUTH DAILY. IN AM 30 MINUTES BEFORE BREAKFAST   triamcinolone 55 MCG/ACT Aero nasal inhaler Commonly known as: NASACORT Place 1 spray into the nose as needed.         REVIEW OF SYSTEMS: Pertinent positives and negatives discussed in HPI.   Objective:   Physical Exam: BP (!) 168/78   Pulse (!) 115   Temp 98.3 F (36.8 C)   Resp 18   Ht '5\' 1"'$  (1.549 m)   Wt 257 lb 4 oz (116.7 kg)   LMP 11/22/2013 (Approximate)   SpO2 97%   BMI 48.61 kg/m  Body mass index is 48.61 kg/m. GEN: alert, well developed HEENT: clear conjunctiva, TM grey and translucent, nose with + inferior turbinate hypertrophy, pink nasal mucosa, slight clear rhinorrhea, + cobblestoning HEART: regular rate and rhythm, no murmur LUNGS: clear to auscultation bilaterally, no coughing, unlabored respiration ABDOMEN: soft, non distended  SKIN: diffuse erythematous  maculopapular rash on neck, back and legs.    Reviewed:  Derm notes  Skin Testing:  Skin prick testing was placed, which includes aeroallergens/foods, histamine control, and saline control.  Verbal consent was obtained prior to placing test.  We discussed risks including anaphylaxis. Patient tolerated procedure well.  Allergy testing results were read and interpreted by myself, documented by clinical staff. Adequate positive and negative control.  Results discussed with patient/family.  Airborne Adult Perc - 03/16/22 0950     Time Antigen Placed 0950    Allergen Manufacturer Lavella Hammock    Location Back    Number of Test 59    Panel 1 Select    1. Control-Buffer 50% Glycerol Negative    2.  Control-Histamine 1 mg/ml 3+    3. Albumin saline Negative    4. Darwin Negative    5. [place] Negative    6. Johnson Negative    7. [place] Blue Negative    8. Meadow Fescue Negative    9. Perennial Rye Negative    10. Sweet Vernal Negative    11. Timothy Negative    12. Cocklebur Negative    13. Burweed Marshelder Negative    14. Ragweed, short Negative    15. Ragweed, Giant Negative    16. Plantain,  English Negative    17. Lamb's Quarters Negative    18. Sheep Sorrell Negative    19. Rough Pigweed Negative    20. Marsh Elder, Rough Negative    21. Mugwort, Common Negative    22. Ash mix Negative    23. Birch mix Negative    24. Beech American Negative    25. Box, Elder Negative    26. Cedar, red Negative    27. Cottonwood, [place] Negative    28. Elm mix Negative    29. Hickory Negative    30. Maple mix Negative    31. Oak, [place] mix Negative    32. Pecan Pollen Negative    33. Pine mix Negative    34. Sycamore Eastern Negative    35. [place], Black Pollen Negative    36. Alternaria alternata Negative    37. Cladosporium Herbarum Negative    38. Aspergillus mix Negative    39. Penicillium mix Negative    40. Bipolaris sorokiniana (Helminthosporium) Negative    41. Drechslera  spicifera (Curvularia) Negative    42. Mucor plumbeus Negative    43. Fusarium moniliforme Negative    44. Aureobasidium pullulans (pullulara) Negative    45. Rhizopus oryzae Negative    46. Botrytis cinera Negative    47. Epicoccum nigrum Negative    48. Phoma betae Negative    49. Candida Albicans Negative    50. Trichophyton mentagrophytes Negative    51. Mite, D Farinae  5,000 AU/ml Negative    52. Mite, D Pteronyssinus  5,000 AU/ml Negative    53. Cat Hair 10,000 BAU/ml Negative    54.  Dog Epithelia Negative    55. Mixed Feathers Negative    56. Horse Epithelia Negative    57. Cockroach, German Negative    58. Mouse Negative    59. Tobacco Leaf Negative             Intradermal - 03/16/22 0951     Time Antigen Placed 2683    Allergen Manufacturer Lavella Hammock    Location Arm    Number of Test 15    Intradermal Select    Control Negative    Guatemala Negative    Johnson Negative    7 Grass Negative    Ragweed mix Negative    Weed mix Negative    Tree mix Negative    Mold 1 Negative    Mold 2 Negative    Mold 3 Negative    Mold 4 Negative    Cat Negative    Dog Negative    Cockroach Negative    Mite mix Negative               Assessment:   1. Chronic rhinitis   2. Allergic conjunctivitis of both eyes   3. Contact dermatitis due to chemicals     Plan/Recommendations:  Chronic Rhinitis Allergic Conjunctivitis - Negative SPT 02/2022 - Use nasal saline  rinses before nose sprays such as with Neilmed Sinus Rinse.  Use distilled water.   - Use Nasacort 2 sprays each nostril daily. Aim upward and outward. - Use Azelastine 1-2 sprays twice daily.  Aim upward and outward.    Dermatitis - Could be contact dermatitis to a chemical vs drug reaction. Does not appear urticarial.  - Use Allegra '180mg'$  twice daily.  Increase it to '360mg'$  twice daily if needed. Stop Claritin/Xyzal. She is to keep track of whether this helps with the rash and itching or not.  - Use  Hydroxyzine '25mg'$  at nighttime as needed.  It can make you sleepy. - Recommend setting up for patch testing.  If patch testing is negative, will need to consider possible drug reaction.  - Keep follow up with Dermatology.  Follow up: Patch testing in 2-3 weeks. Follow up in 6 weeks.     Return in about 2 weeks (around 03/30/2022).  Harlon Flor, MD [facility]

## 2022-03-19 ENCOUNTER — Other Ambulatory Visit: Payer: Self-pay | Admitting: Family

## 2022-03-19 DIAGNOSIS — E039 Hypothyroidism, unspecified: Secondary | ICD-10-CM

## 2022-03-23 MED ORDER — LEVOTHYROXINE SODIUM 50 MCG PO TABS: 50.0000 ug | ORAL_TABLET | Freq: Every day | ORAL | 0 refills | Status: DC

## 2022-03-23 NOTE — Addendum Note (Signed)
Addended by: Eugenia Pancoast on: 03/23/2022 11:37 AM   Modules accepted: Orders

## 2022-03-25 DIAGNOSIS — R7309 Other abnormal glucose: Secondary | ICD-10-CM | POA: Diagnosis not present

## 2022-03-31 ENCOUNTER — Other Ambulatory Visit: Payer: Self-pay | Admitting: Dermatology

## 2022-03-31 DIAGNOSIS — R21 Rash and other nonspecific skin eruption: Secondary | ICD-10-CM

## 2022-04-01 ENCOUNTER — Ambulatory Visit: Payer: BC Managed Care – PPO | Admitting: Dermatology

## 2022-04-01 ENCOUNTER — Encounter: Payer: Self-pay | Admitting: Dermatology

## 2022-04-01 ENCOUNTER — Telehealth: Payer: Self-pay

## 2022-04-01 ENCOUNTER — Other Ambulatory Visit: Payer: Self-pay

## 2022-04-01 ENCOUNTER — Other Ambulatory Visit (INDEPENDENT_AMBULATORY_CARE_PROVIDER_SITE_OTHER): Payer: BC Managed Care – PPO

## 2022-04-01 DIAGNOSIS — L820 Inflamed seborrheic keratosis: Secondary | ICD-10-CM

## 2022-04-01 DIAGNOSIS — L280 Lichen simplex chronicus: Secondary | ICD-10-CM | POA: Diagnosis not present

## 2022-04-01 DIAGNOSIS — E039 Hypothyroidism, unspecified: Secondary | ICD-10-CM

## 2022-04-01 DIAGNOSIS — L299 Pruritus, unspecified: Secondary | ICD-10-CM | POA: Diagnosis not present

## 2022-04-01 DIAGNOSIS — Z86010 Personal history of colonic polyps: Secondary | ICD-10-CM

## 2022-04-01 DIAGNOSIS — R21 Rash and other nonspecific skin eruption: Secondary | ICD-10-CM | POA: Diagnosis not present

## 2022-04-01 LAB — CBC WITH DIFFERENTIAL/PLATELET
Basophils Absolute: 0.1 10*3/uL (ref 0.0–0.1)
Basophils Relative: 1.2 % (ref 0.0–3.0)
Eosinophils Absolute: 0.5 10*3/uL (ref 0.0–0.7)
Eosinophils Relative: 5.5 % — ABNORMAL HIGH (ref 0.0–5.0)
HCT: 42.5 % (ref 36.0–46.0)
Hemoglobin: 14.5 g/dL (ref 12.0–15.0)
Lymphocytes Relative: 15.5 % (ref 12.0–46.0)
Lymphs Abs: 1.5 10*3/uL (ref 0.7–4.0)
MCHC: 34.1 g/dL (ref 30.0–36.0)
MCV: 89.8 fl (ref 78.0–100.0)
Monocytes Absolute: 1 10*3/uL (ref 0.1–1.0)
Monocytes Relative: 10.1 % (ref 3.0–12.0)
Neutro Abs: 6.4 10*3/uL (ref 1.4–7.7)
Neutrophils Relative %: 67.7 % (ref 43.0–77.0)
Platelets: 255 10*3/uL (ref 150.0–400.0)
RBC: 4.72 Mil/uL (ref 3.87–5.11)
RDW: 13.7 % (ref 11.5–15.5)
WBC: 9.5 10*3/uL (ref 4.0–10.5)

## 2022-04-01 LAB — TSH: TSH: 3.81 u[IU]/mL (ref 0.35–5.50)

## 2022-04-01 MED ORDER — TACROLIMUS 0.1 % EX OINT: TOPICAL_OINTMENT | Freq: Two times a day (BID) | CUTANEOUS | 2 refills | Status: DC

## 2022-04-01 MED ORDER — CLOBETASOL PROPIONATE 0.05 % EX CREA: TOPICAL_CREAM | CUTANEOUS | 2 refills | Status: DC

## 2022-04-01 NOTE — Patient Instructions (Addendum)
Start Clobetasol cream twice daily for 2 weeks. Avoid applying to face, groin, and axilla. Use as directed. Long-term use can cause thinning of the skin.  After the 2 weeks Start Tacrolimus ointment twice daily.    Topical steroids (such as triamcinolone, fluocinolone, fluocinonide, mometasone, clobetasol, halobetasol, betamethasone, hydrocortisone) can cause thinning and lightening of the skin if they are used for too long in the same area. Your physician has selected the right strength medicine for your problem and area affected on the body. Please use your medication only as directed by your physician to prevent side effects.     Gentle Skin Care Guide  1. Bathe no more than once a day.  2. Avoid bathing in hot water  3. Use a mild soap like Dove, Vanicream, Cetaphil, CeraVe. Can use Lever 2000 or Cetaphil antibacterial soap  4. Use soap only where you need it. On most days, use it under your arms, between your legs, and on your feet. Let the water rinse other areas unless visibly dirty.  5. When you get out of the bath/shower, use a towel to gently blot your skin dry, don't rub it.  6. While your skin is still a little damp, apply a moisturizing cream such as Vanicream, CeraVe, Cetaphil, Eucerin, Sarna lotion or plain Vaseline Jelly. For hands apply Neutrogena [place] Hand Cream or Excipial Hand Cream.  7. Reapply moisturizer any time you start to itch or feel dry.  8. Sometimes using free and clear laundry detergents can be helpful. Fabric softener sheets should be avoided. Downy Free & Gentle liquid, or any liquid fabric softener that is free of dyes and perfumes, it acceptable to use  9. If your doctor has given you prescription creams you may apply moisturizers over them      Due to recent changes in healthcare laws, you may see results of your pathology and/or laboratory studies on MyChart before the doctors have had a chance to review them. We understand that in some cases  there may be results that are confusing or concerning to you. Please understand that not all results are received at the same time and often the doctors may need to interpret multiple results in order to provide you with the best plan of care or course of treatment. Therefore, we ask that you please give [place] 2 business days to thoroughly review all your results before contacting the office for clarification. Should we see a critical lab result, you will be contacted sooner.   If You Need Anything After Your Visit  If you have any questions or concerns for your doctor, please call our main line at [phone] and press option 4 to reach your doctor's medical assistant. If no one answers, please leave a voicemail as directed and we will return your call as soon as possible. Messages left after 4 pm will be answered the following business day.   You may also send [place] a message via [place]. We typically respond to MyChart messages within 1-2 business days.  For prescription refills, please ask your pharmacy to contact our office. Our fax number is [phone].  If you have an urgent issue when the clinic is closed that cannot wait until the next business day, you can page your doctor at the number below.    Please note that while we do our best to be available for urgent issues outside of office hours, we are not available 24/7.   If you have an urgent issue and are unable  to reach [place], you may choose to seek medical care at your doctor's office, retail clinic, urgent care center, or emergency room.  If you have a medical emergency, please immediately call 911 or go to the emergency department.  Pager Numbers  - Dr. Nehemiah Massed: [phone]  - Dr. Laurence Ferrari: [phone]  - Dr. Nicole Kindred: [phone]  In the event of inclement weather, please call our main line at [phone] for an update on the status of any delays or closures.  Dermatology Medication Tips: Please keep the boxes that topical  medications come in in order to help keep track of the instructions about where and how to use these. Pharmacies typically print the medication instructions only on the boxes and not directly on the medication tubes.   If your medication is too expensive, please contact our office at [phone] option 4 or send [place] a message through [place].   We are unable to tell what your co-pay for medications will be in advance as this is different depending on your insurance coverage. However, we may be able to find a substitute medication at lower cost or fill out paperwork to get insurance to cover a needed medication.   If a prior authorization is required to get your medication covered by your insurance company, please allow [place] 1-2 business days to complete this process.  Drug prices often vary depending on where the prescription is filled and some pharmacies may offer cheaper prices.  The website www.goodrx.com contains coupons for medications through different pharmacies. The prices here do not account for what the cost may be with help from insurance (it may be cheaper with your insurance), but the website can give you the price if you did not use any insurance.  - You can print the associated coupon and take it with your prescription to the pharmacy.  - You may also stop by our office during regular business hours and pick up a GoodRx coupon card.  - If you need your prescription sent electronically to a different pharmacy, notify our office through [facility] or by phone at [phone] option 4.     Si Usted Necesita Algo Despus de Su Visita  Tambin puede enviarnos un mensaje a travs de Pharmacist, community. Por lo general respondemos a los mensajes de MyChart en el transcurso de 1 a 2 das hbiles.  Para renovar recetas, por favor pida a su farmacia que se ponga en contacto con nuestra oficina. Harland Dingwall de fax es [place] [phone].  Si tiene un asunto urgente cuando la clnica est  cerrada y que no puede esperar hasta el siguiente da hbil, puede llamar/localizar a su doctor(a) al nmero que aparece a continuacin.   Por favor, tenga en cuenta que aunque hacemos todo lo posible para estar disponibles para asuntos urgentes fuera del horario de [place], no estamos disponibles las 24 horas del da, los 7 das de la [place].   Si tiene un problema urgente y no puede comunicarse con nosotros, puede optar por buscar atencin mdica  en el consultorio de su doctor(a), en una clnica privada, en un centro de atencin urgente o en una sala de emergencias.  Si tiene Engineering geologist, por favor llame inmediatamente al 911 o vaya a la sala de emergencias.  Nmeros de bper  - Dr. Nehemiah Massed: [phone]  - Dra. Moye: [phone]  - Dra. Nicole Kindred: [phone]  En caso de inclemencias del [place], por favor llame a nuestra lnea principal al [phone] para una actualizacin sobre el [place] de  cualquier retraso o cierre.  Consejos para la medicacin en dermatologa: Por favor, guarde las cajas en las que vienen los medicamentos de uso tpico para ayudarle a seguir las instrucciones sobre dnde y cmo usarlos. Las farmacias generalmente imprimen las instrucciones del medicamento slo en las cajas y no directamente en los tubos del [place].   Si su medicamento es muy caro, por favor, pngase en contacto con Zigmund Daniel llamando al [phone] y presione la opcin 4 o envenos un mensaje a travs de Pharmacist, community.   No podemos decirle cul ser su copago por los medicamentos por adelantado ya que esto es diferente dependiendo de la cobertura de su seguro. Sin embargo, es posible que podamos encontrar un medicamento sustituto a Electrical engineer un formulario para que el seguro cubra el medicamento que se considera necesario.   Si se requiere una autorizacin previa para que su compaa de seguros [place] su medicamento, por favor permtanos de 1 a 2 das hbiles para  completar este proceso.  Los precios de los medicamentos varan con frecuencia dependiendo del Environmental consultant de dnde se surte la receta y alguna farmacias pueden ofrecer precios ms baratos.  El sitio web www.goodrx.com tiene cupones para medicamentos de Airline pilot. Los precios aqu no tienen en cuenta lo que podra costar con la ayuda del seguro (puede ser ms barato con su seguro), pero el sitio web puede darle el precio si no utiliz Research scientist (physical sciences).  - Puede imprimir el cupn correspondiente y llevarlo con su receta a la farmacia.  - Tambin puede pasar por nuestra oficina durante el horario de atencin regular y Charity fundraiser una tarjeta de cupones de GoodRx.  - Si necesita que su receta se enve electrnicamente a una farmacia diferente, informe a nuestra oficina a travs de MyChart de [place] o por telfono llamando al [phone] y presione la opcin 4.

## 2022-04-01 NOTE — Telephone Encounter (Signed)
Patient contacted office to reschedule her colonoscopy.  Colonoscopy has been rescheduled to 05/05/22 with Dr. Allen Norris at [facility].   Thanks,  [place], [place]

## 2022-04-01 NOTE — Progress Notes (Signed)
   Follow-Up Visit   Subjective  Bridget Reed is a 61 y.o. female who presents for the following: Rash (Chest. C/O rash and itching. Dur: "forever". Insect bites on legs have resolved. Has seen allergist few weeks ago, had skin tests, nothing significant per Dr. Posey Pronto. Will be going back for patch testing in early December. No hx of eczema, asthma as a child. No changes with any products. Uses [address]).  She has tried the clobetasol/CeraVe mixture without much improvement with itching.    The following portions of the chart were reviewed this encounter and updated as appropriate:      Review of Systems: No other skin or systemic complaints except as noted in HPI or Assessment and Plan.   Objective  Well appearing patient in no apparent distress; mood and affect are within normal limits.  A focused examination was performed including chest. Relevant physical exam findings are noted in the Assessment and Plan.  Chest - Medial (Center) Pink lichenified patches at chest and upper back. Chest > upper back  mid intermammary chest x1 Erythematous keratotic or waxy stuck-on papule or plaque.   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 Condition is bothersome/symptomatic for patient. Currently flared.  Will do patch testing on f/up to r/o contact allergy  Recommend mild soap and moisturizing cream 1-2 times daily.  Gentle skin care handout provided.   Recommend using Dove for sensitive skin for body wash.  Recommend using Vanicream line for moisturizer  Start Clobetasol cream twice daily for 2 weeks. Avoid applying to face, groin, and axilla. Use as directed. Long-term use can cause thinning of the skin.  After the 2 weeks Start Tacrolimus 0.1% ointment twice daily until rash cleared.  Topical steroids (such as triamcinolone, fluocinolone,  fluocinonide, mometasone, clobetasol, halobetasol, betamethasone, hydrocortisone) can cause thinning and lightening of the skin if they are used for too long in the same area. Your physician has selected the right strength medicine for your problem and area affected on the body. Please use your medication only as directed by your physician to prevent side effects.    clobetasol cream (TEMOVATE) 0.05 % - Chest - Medial [facility]) Apply twice daily for 2 weeks. Avoid applying to face, groin, and axilla.  tacrolimus (PROTOPIC) 0.1 % ointment - Chest - Medial [facility]) Apply topically 2 (two) times daily.  Related Medications levocetirizine (XYZAL) 5 MG tablet TAKE 1 TABLET (5 MG TOTAL) BY MOUTH DAILY.  Inflamed seborrheic keratosis mid intermammary chest x1  Symptomatic, irritating, patient would like treated.  Destruction of lesion - mid intermammary chest x1  Destruction method: cryotherapy   Informed consent: discussed and consent obtained   Lesion destroyed using liquid nitrogen: Yes   Region frozen until ice ball extended beyond lesion: Yes   Outcome: patient tolerated procedure well with no complications   Post-procedure details: wound care instructions given   Additional details:  Prior to procedure, discussed risks of blister formation, small wound, skin dyspigmentation, or rare scar following cryotherapy. Recommend Vaseline ointment to treated areas while healing.    Return in about 3 weeks (around 04/22/2022) for Patch Testing.  I, Emelia Salisbury, CMA, am acting as scribe for Brendolyn Patty, MD.  Documentation: I have reviewed the above documentation for accuracy and completeness, and I agree with the above.  Brendolyn Patty MD

## 2022-04-02 LAB — THYROID PEROXIDASE ANTIBODY: Thyroperoxidase Ab SerPl-aCnc: <1 IU/mL (ref <9)

## 2022-04-06 ENCOUNTER — Ambulatory Visit: Payer: BC Managed Care – PPO | Admitting: Family Medicine

## 2022-04-08 ENCOUNTER — Encounter: Payer: BC Managed Care – PPO | Admitting: Allergy

## 2022-04-10 ENCOUNTER — Encounter: Payer: BC Managed Care – PPO | Admitting: Internal Medicine

## 2022-04-14 ENCOUNTER — Other Ambulatory Visit: Payer: Self-pay | Admitting: Family

## 2022-04-14 DIAGNOSIS — E039 Hypothyroidism, unspecified: Secondary | ICD-10-CM

## 2022-04-19 ENCOUNTER — Encounter: Payer: Self-pay | Admitting: Family

## 2022-04-19 DIAGNOSIS — E039 Hypothyroidism, unspecified: Secondary | ICD-10-CM

## 2022-04-21 ENCOUNTER — Ambulatory Visit: Payer: BC Managed Care – PPO | Admitting: Dermatology

## 2022-04-21 MED ORDER — LEVOTHYROXINE SODIUM 88 MCG PO TABS: 88.0000 ug | ORAL_TABLET | Freq: Every day | ORAL | 0 refills | Status: DC

## 2022-04-23 ENCOUNTER — Ambulatory Visit: Payer: BC Managed Care – PPO

## 2022-04-24 DIAGNOSIS — R7309 Other abnormal glucose: Secondary | ICD-10-CM | POA: Diagnosis not present

## 2022-04-27 ENCOUNTER — Ambulatory Visit: Payer: BC Managed Care – PPO | Admitting: Internal Medicine

## 2022-04-27 ENCOUNTER — Encounter: Payer: Self-pay | Admitting: Physician Assistant

## 2022-04-27 ENCOUNTER — Telehealth: Payer: Self-pay

## 2022-04-27 ENCOUNTER — Ambulatory Visit (INDEPENDENT_AMBULATORY_CARE_PROVIDER_SITE_OTHER): Payer: BC Managed Care – PPO | Admitting: Physician Assistant

## 2022-04-27 VITALS — BP 130/60 | HR 101 | Temp 98.2°F

## 2022-04-27 DIAGNOSIS — L509 Urticaria, unspecified: Secondary | ICD-10-CM

## 2022-04-27 DIAGNOSIS — R21 Rash and other nonspecific skin eruption: Secondary | ICD-10-CM

## 2022-04-27 MED ORDER — METHYLPREDNISOLONE 4 MG PO TBPK: ORAL_TABLET | ORAL | 0 refills | Status: DC

## 2022-04-27 MED ORDER — HYDROXYZINE HCL 10 MG PO TABS: 10.0000 mg | ORAL_TABLET | Freq: Three times a day (TID) | ORAL | 1 refills | Status: DC | PRN

## 2022-04-27 NOTE — Progress Notes (Signed)
[facility] [address], [place] [address]   Office Visit Note  Patient Name: Bridget Reed Date of Birth [id number]  Medical Record number [id number]  Date of Service: 04/27/2022  Chief Complaint  Patient presents with   Rash    Itching has been going on for months. Was told it was from thyroid, started meds and just increased dose last week. Rash popped up Thur.  Burns and itches.      61 y/o F presents to the clinic for c/o generalized itching and rash x 4 days. No changes in diet, hygiene products ie soaps, detergent, or body wash. She has been started on Levothyroxine about 2 months ago and was recently changed dose. No other supplement or vitamin changes. Has been taking otc Allegra or Claritin. She had been to Allergist, but no triggers identified. Does see a Dermatologist and is scheduled for an annual skin examination next week.   Rash      Current Medication:  Outpatient Encounter Medications as of 04/27/2022  Medication Sig   amLODipine (NORVASC) 2.5 MG tablet TAKE 1 TABLET (2.5 MG TOTAL) BY MOUTH DAILY. FOR BLOOD PRESSURE >130/>80   azelastine (ASTELIN) 0.1 % nasal spray Place 1-2 sprays into both nostrils twice daily.  Aim upward and outward.   clobetasol cream (TEMOVATE) 0.05 % Apply twice daily for 2 weeks. Avoid applying to face, groin, and axilla.   hydrOXYzine (ATARAX) 10 MG tablet Take 1 tablet (10 mg total) by mouth 3 (three) times daily as needed for itching.   hydrOXYzine (ATARAX) 25 MG tablet Take 1 tablet (25 mg total) by mouth at bedtime as needed.   hydrOXYzine (VISTARIL) 25 MG capsule Take 1 capsule (25 mg total) by mouth every 8 (eight) hours as needed.   levocetirizine (XYZAL) 5 MG tablet TAKE 1 TABLET (5 MG TOTAL) BY MOUTH DAILY.   levothyroxine (SYNTHROID) 88 MCG tablet Take 1 tablet (88 mcg total) by mouth daily.   lisinopril-hydrochlorothiazide (ZESTORETIC) 20-12.5 MG tablet Take 1 tablet by mouth daily. In am if BP >130/>80  02/27/20 take 2 pills in the am each day   loratadine (CLARITIN) 10 MG tablet Take 10 mg by mouth daily.   methylPREDNISolone (MEDROL DOSEPAK) 4 MG TBPK tablet Take as directed   omeprazole (PRILOSEC) 40 MG capsule TAKE 1 CAPSULE (40 MG TOTAL) BY MOUTH DAILY. IN AM 30 MINUTES BEFORE BREAKFAST   tacrolimus (PROTOPIC) 0.1 % ointment Apply topically 2 (two) times daily.   triamcinolone (NASACORT) 55 MCG/ACT AERO nasal inhaler Place 2 sprays into the nose daily.   No facility-administered encounter medications on file as of 04/27/2022.      Medical History: Past Medical History:  Diagnosis Date   Allergy    Basal cell carcinoma 06/17/2017   right chest    GERD (gastroesophageal reflux disease)    Hypertension    Left tennis elbow    Morbid obesity ([place]) 07/13/2016     Vital Signs: BP 130/60 (BP Location: Left Arm, Patient Position: Sitting, Cuff Size: Large)   Pulse (!) 101   Temp 98.2 F (36.8 C) (Tympanic)   LMP 11/22/2013 (Approximate)   SpO2 99%    Review of Systems  Constitutional: Negative.   HENT: Negative.    Respiratory: Negative.    Cardiovascular: Negative.   Endocrine: Negative.   Skin:  Positive for rash.  Psychiatric/Behavioral: Negative.      Physical Exam Constitutional:      Appearance: Normal appearance.  HENT:  Head: Normocephalic and atraumatic.     Right Ear: External ear normal.     Left Ear: External ear normal.  Eyes:     Extraocular Movements: Extraocular movements intact.  Skin:    General: Skin is warm and dry.     Findings: Rash present. Rash is macular, papular and urticarial.     Comments: Generalized maculopapular rash of b/l lower and upper extremities and back.   Neurological:     Mental Status: She is alert and oriented to person, place, and time.  Psychiatric:        Mood and Affect: Mood normal.        Behavior: Behavior normal.       Assessment/Plan:  1. Generalized rash - methylPREDNISolone (MEDROL DOSEPAK) 4 MG  TBPK tablet; Take as directed  Dispense: 1 each; Refill: 0  2. Urticaria - hydrOXYzine (ATARAX) 10 MG tablet; Take 1 tablet (10 mg total) by mouth 3 (three) times daily as needed for itching.  Dispense: 30 tablet; Refill: 1  Reviewed my clinical findings with patient.  No triggers for her itchy rash. Start applying cold compresses to the affected area Take luke warm showers Continue with Claritin and Allegra for itching Start oral steroids as prescribed. Apply Sarna to the affected area for itching. Continue to watch for worsening symptoms. RTC prn Pt verbalized understanding and in agreement.    General Counseling: ily denno understanding of the findings of todays visit and agrees with plan of treatment. I have discussed any further diagnostic evaluation that may be needed or ordered today. We also reviewed her medications today. she has been encouraged to call the office with any questions or concerns that should arise related to todays visit.    Time spent:20 [place], [place] Physician Assistant

## 2022-04-27 NOTE — Telephone Encounter (Signed)
Bridget Males RN with access calling pt was seen earlier today and received cortisone and pt is still itching; pt was seen at [employer] and RN said she needs appt and Claiborne Billings at front desk gave appt and I advised Sherlene Shams prior to appt condition changes or worsens or difficulty breathing or swelling in neck, throat, mouth to go to ED. Pt scheduled with Red Christians FNP on 04/28/22 at 2:20 and Claiborne Billings at front desk gave Bridget Reed the appt info.. Sending note to Red Christians FNP and [place] pool and spoke with [facility] CMA.   [facility] - Client TELEPHONE ADVICE RECORD [facility] Patient Name: Bridget Reed HE Gender: Female DOB: December 13, 1960 Age: 61 Y 3 M 20 D Return Phone Number: [phone] (Primary) Address: City/ State/ Zip: [place]  [address] Client [facility] - Client Client Site [facility] - Day Provider AA - PHYSICIAN, NOT LISTED- MD Contact Type Call Who Is Calling Patient / Member / Family / Caregiver Call Type Triage / Clinical Relationship To Patient Self Return Phone Number [phone] (Primary) Chief Complaint Rash - Widespread Reason for Call Symptomatic / Request for Bridget Reed states she recently had an increase in medication dosage and developed a rash all over. Translation No Nurse Assessment Nurse: Casey Burkitt, RN, Abran Richard Date/Time (Eastern Time): 04/27/2022 1:52:49 PM Confirm and document reason for call. If symptomatic, describe symptoms. ---Caller states she is taking thyroid medication that had gotten recently increased, she has developed a rash, got worse over the weekend. Red, clustered/ raised. Does the patient have any new or worsening symptoms? ---Yes Will a triage be completed? ---Yes Related visit to physician within the last 2 weeks? ---No Does the PT have any chronic conditions? (i.e. diabetes, asthma, this includes High risk factors for pregnancy,  etc.) ---Yes List chronic conditions. ---thyroid Is this a behavioral health or substance abuse call? ---No Guidelines Guideline Title Affirmed Question Affirmed Notes Nurse Date/Time (Eastern Time) Rash - Widespread On Drugs Hives or itching Youman, RN, Abran Richard 04/27/2022 1:55:03 PM Disp. Time Eilene Ghazi Time) Disposition Final User 04/27/2022 2:23:36 PM See PCP within 24 Hours Yes Casey Burkitt, RN, Abran Richard Final Disposition 04/27/2022 2:23:36 PM See PCP within 24 Hours Yes Youman, RN, Abran Richard PLEASE NOTE: All timestamps contained within this report are represented as [place] Standard Time. CONFIDENTIALTY NOTICE: This fax transmission is intended only for the addressee. It contains information that is legally privileged, confidential or otherwise protected from use or disclosure. If you are not the intended recipient, you are strictly prohibited from reviewing, disclosing, copying using or disseminating any of this information or taking any action in reliance on or regarding this information. If you have received this fax in error, please notify us immediately by telephone so that we can arrange for its return to [place]. Phone: [phone], Toll-Free: [phone], Fax: [phone] Page: 2 of 2 Call Id: [id number] [place] Disagree/Comply Comply Caller Understands Yes PreDisposition Go to Urgent Care/Walk-In Clinic Care Advice Given Per Guideline * IF OFFICE WILL BE OPEN: You need to be examined within the next 24 hours. Call your doctor (or NP/PA) when the office opens and make an appointment. STOP THE MEDICINE: * Severe itching and rash can be a sign of a drug allergy. * Stop the medicine until you are examined. ANTIHISTAMINE MEDICINES FOR HIVES OR FOR SEVERE ITCHING: * CETIRIZINE (REACTINE, ZYRTEC): The adult dose is 10 mg and you take it once a day. Cetirizine is available in  the [place] as Zyrtec and in [place] as Reactine. REDUCING THE ITCH - OATMEAL (AVEENO) BATH: * Sprinkle contents of  one packet of Aveeno under running faucet with comfortably warm water. * Bathe for 15 to 20 minutes, 1 to 2 times daily. * Pat dry using towel - do not rub. * Caution: This can make the tub slippery. COOL BATH FOR ITCHING: * For flare-ups of itching, you can try taking a cool bath for 10 minutes. Caution: avoid any chill. Optional: add 2 oz of baking soda to the tub. * You can also rub very itchy areas with an ice cube for 10 minutes. CALL BACK IF: * You become worse CARE ADVICE given per Rash - Widespread on Drugs (Adult) guideline Comments User: Shella Maxim, RN Date/Time Eilene Ghazi Time): 04/27/2022 1:55:56 PM Has been on Levothryoxine for 8 weeks, when she increased the dosage the rash got worse. User: Shella Maxim, RN Date/Time Eilene Ghazi Time): 04/27/2022 2:25:09 PM Spoke to charge nurse about outcome, appropriate to talk to backline and assist caller with being seen, she was seen today for itching but it is becoming severe, backline infromed RN she has not been by this office and needs to get an appt in. Appt was scheduled for patient. Referrals Warm transfer to backline REFERRED TO PCP OFFIC

## 2022-04-27 NOTE — Telephone Encounter (Signed)
Agree with precautions given to pt  Agree with nurse assessment in plan.  Thank you for speaking with them. 

## 2022-04-28 ENCOUNTER — Ambulatory Visit: Payer: BC Managed Care – PPO | Admitting: Family

## 2022-04-28 ENCOUNTER — Ambulatory Visit: Payer: BC Managed Care – PPO | Admitting: Dermatology

## 2022-04-28 ENCOUNTER — Encounter: Payer: Self-pay | Admitting: Family

## 2022-04-28 VITALS — BP 170/70 | HR 89 | Temp 98.4°F | Resp 16 | Ht 61.0 in | Wt 259.1 lb

## 2022-04-28 DIAGNOSIS — E039 Hypothyroidism, unspecified: Secondary | ICD-10-CM | POA: Diagnosis not present

## 2022-04-28 DIAGNOSIS — L282 Other prurigo: Secondary | ICD-10-CM | POA: Insufficient documentation

## 2022-04-28 MED ORDER — LEVOTHYROXINE SODIUM 88 MCG PO TABS: 88.0000 ug | ORAL_TABLET | Freq: Every day | ORAL | 0 refills | Status: DC

## 2022-04-28 NOTE — Progress Notes (Signed)
Established Patient Office Visit  Subjective:  Patient ID: Bridget Reed, female    DOB: [birth year]  Age: 61 y.o. MRN: [id number]  CC:  Chief Complaint  Patient presents with   Pruritis    HPI Bridget Reed is here today with concerns.   Went to wellness at her job and she was given medrol dose pack (on day two today) for rash that she suspects is from the increased dose levothyroxine. She states was slightly there on her left anterior forearm in a small amount prior to increase to 88 mcg but once she increased to 88 mcg she noticed the rash was on bil arms legs and back. Today getting better slowly, still itchy.  She is using hydroxyxine at night as well which helps. She has not taken the thyroid medication today.  Past Medical History:  Diagnosis Date   Allergy    Basal cell carcinoma 06/17/2017   right chest    GERD (gastroesophageal reflux disease)    Hypertension    Left tennis elbow    Morbid obesity ([place]) 07/13/2016    Past Surgical History:  Procedure Laterality Date   COLONOSCOPY     COLONOSCOPY WITH PROPOFOL N/A 10/14/2015   Procedure: COLONOSCOPY WITH PROPOFOL;  Surgeon: Lucilla Lame, MD;  Location: [facility];  Service: Endoscopy;  Laterality: N/A;   NO PAST SURGERIES      Family History  Problem Relation Age of Onset   Heart failure Mother    Pancreatic cancer Mother    Osteopenia Mother    Arthritis Mother    Kidney cancer Father    Heart disease Father    Hypertension Father    Hypertension Brother    Atrial fibrillation Brother    Heart disease Maternal Grandmother    Leukemia Maternal Grandfather    Breast cancer Paternal Aunt 55   Colon cancer Paternal Aunt     Social History   Socioeconomic History   Marital status: Married    Spouse name: Not on file   Number of children: 0   Years of education: Not on file   Highest education level: Not on file  Occupational History    Employer: [employer]  Tobacco Use   Smoking  status: Never   Smokeless tobacco: Never  Vaping Use   Vaping Use: Never used  Substance and Sexual Activity   Alcohol use: Yes    Alcohol/week: 9.0 standard drinks of alcohol    Types: 9 Glasses of wine per week    Comment: 3-4 glasses of wine about three days a week   Drug use: No   Sexual activity: Yes    Partners: Male    Birth control/protection: Post-menopausal  Other Topics Concern   Not on file  Social History Narrative   Diet: moderate.. Working on improving .   Walks 3-4 times a week.    No kids 1 step daughter married    Works Conservator, museum/gallery of Radio broadcast assistant Strain: Not on [employer] Insecurity: Not on file  Transportation Needs: Not on file  Physical Activity: Not on file  Stress: Not on file  Social Connections: Not on file  Intimate Partner Violence: Not on file    Outpatient Medications Prior to Visit  Medication Sig Dispense Refill   amLODipine (NORVASC) 2.5 MG tablet TAKE 1 TABLET (2.5 MG TOTAL) BY MOUTH DAILY. FOR BLOOD PRESSURE >130/>80 90 tablet 3   azelastine (ASTELIN) 0.1 % nasal  spray Place 1-2 sprays into both nostrils twice daily.  Aim upward and outward. 30 mL 5   clobetasol cream (TEMOVATE) 0.05 % Apply twice daily for 2 weeks. Avoid applying to face, groin, and axilla. 60 g 2   hydrOXYzine (ATARAX) 10 MG tablet Take 1 tablet (10 mg total) by mouth 3 (three) times daily as needed for itching. 30 tablet 1   hydrOXYzine (ATARAX) 25 MG tablet Take 1 tablet (25 mg total) by mouth at bedtime as needed. 30 tablet 5   lisinopril-hydrochlorothiazide (ZESTORETIC) 20-12.5 MG tablet Take 1 tablet by mouth daily. In am if BP >130/>80 02/27/20 take 2 pills in the am each day 180 tablet 3   loratadine (CLARITIN) 10 MG tablet Take 10 mg by mouth daily.     methylPREDNISolone (MEDROL DOSEPAK) 4 MG TBPK tablet Take as directed 1 each 0   omeprazole (PRILOSEC) 40 MG capsule TAKE 1 CAPSULE (40 MG TOTAL) BY MOUTH DAILY. IN AM 30 MINUTES BEFORE  BREAKFAST 90 capsule 3   tacrolimus (PROTOPIC) 0.1 % ointment Apply topically 2 (two) times daily. 100 g 2   levocetirizine (XYZAL) 5 MG tablet TAKE 1 TABLET (5 MG TOTAL) BY MOUTH DAILY. 90 tablet 0   levothyroxine (SYNTHROID) 88 MCG tablet Take 1 tablet (88 mcg total) by mouth daily. 90 tablet 0   hydrOXYzine (VISTARIL) 25 MG capsule Take 1 capsule (25 mg total) by mouth every 8 (eight) hours as needed. 30 capsule 0   triamcinolone (NASACORT) 55 MCG/ACT AERO nasal inhaler Place 2 sprays into the nose daily. (Patient not taking: Reported on 04/28/2022) 1 each 5   No facility-administered medications prior to visit.    Allergies  Allergen Reactions   Mobic [Meloxicam] Swelling    In hands and ankles and under eyes.   Polytrim [Polymyxin B-Trimethoprim] Itching    Itching of eyes after use. Patient allergic to sulfa   Azithromycin Rash   Erythromycin     Rash, GI upset   Influenza Vac Split Quad Hives    Possible allergy- rash after injection Still takes yearly flu vaccine   Shrimp [Shellfish Allergy] Other (See Comments)    Headache when eaten in excess   Sulfa Antibiotics Other (See Comments)    Unknown, can not remember        Objective:    Physical Exam Constitutional:      Appearance: Normal appearance. She is obese.  Cardiovascular:     Rate and Rhythm: Normal rate and regular rhythm.  Pulmonary:     Effort: Pulmonary effort is normal.     Breath sounds: Normal breath sounds.  Skin:    Findings: Rash (papular rash on bil hands and bil lower legs, resolving) present.  Neurological:     General: No focal deficit present.     Mental Status: She is alert and oriented to person, place, and time. Mental status is at baseline.  Psychiatric:        Mood and Affect: Mood normal.        Behavior: Behavior normal.        Thought Content: Thought content normal.        Judgment: Judgment normal.     BP (!) 170/70   Pulse 89   Temp 98.4 F (36.9 C)   Resp 16   Ht 5'  1" (1.549 m)   Wt 259 lb 2 oz (117.5 kg)   LMP 11/22/2013 (Approximate)   SpO2 97%   BMI 48.96 kg/m  Wt Readings  from Last 3 Encounters:  04/28/22 259 lb 2 oz (117.5 kg)  03/16/22 257 lb 4 oz (116.7 kg)  03/10/22 258 lb 2 oz (117.1 kg)     Health Maintenance Due  Topic Date Due   Zoster Vaccines- Shingrix (1 of 2) Never done   DTaP/Tdap/Td (2 - Tdap) 10/24/2019   COLONOSCOPY (Pts 45-63yr Insurance coverage will need to be confirmed)  10/13/2020   COVID-19 Vaccine (5 - 2023-24 season) 01/23/2022    There are no preventive care reminders to display for this patient.  Lab Results  Component Value Date   TSH 3.81 04/01/2022   Lab Results  Component Value Date   WBC 9.5 04/01/2022   HGB 14.5 04/01/2022   HCT 42.5 04/01/2022   MCV 89.8 04/01/2022   PLT 255.0 04/01/2022   Lab Results  Component Value Date   NA 134 (L) 02/16/2022   K 3.8 02/16/2022   CO2 30 02/16/2022   GLUCOSE 110 (H) 02/16/2022   BUN 15 02/16/2022   CREATININE 0.69 02/16/2022   BILITOT 0.6 02/16/2022   ALKPHOS 75 02/16/2022   AST 16 02/16/2022   ALT 22 02/16/2022   PROT 6.8 02/16/2022   ALBUMIN 4.2 02/16/2022   CALCIUM 9.8 02/16/2022   EGFR 105 01/07/2022   GFR 94.53 02/16/2022   Lab Results  Component Value Date   HGBA1C 5.6 01/07/2022      Assessment & Plan:   Problem List Items Addressed This Visit       Endocrine   Acquired hypothyroidism - Primary   Relevant Medications   levothyroxine (SYNTHROID) 88 MCG tablet     Musculoskeletal and Integument   Pruritic rash    Continue medrol dose pack  Continue hydroxyxine  Continue to remain off of generic levothyroxine for now.         Meds ordered this encounter  Medications   DISCONTD: levothyroxine (SYNTHROID) 88 MCG tablet    Sig: Take 1 tablet (88 mcg total) by mouth daily before breakfast.    Dispense:  30 tablet    Refill:  0   levothyroxine (SYNTHROID) 88 MCG tablet    Sig: Take 1 tablet (88 mcg total) by mouth daily  before breakfast.    Dispense:  30 tablet    Refill:  0    Allergic rash to levothyroxine generic    Follow-up: No follow-ups on file.    TEugenia Pancoast FNP

## 2022-04-28 NOTE — Telephone Encounter (Signed)
Noted  

## 2022-04-28 NOTE — Telephone Encounter (Signed)
Called and spoke to pharmacy and they said should be fine.

## 2022-04-28 NOTE — Assessment & Plan Note (Signed)
Continue medrol dose pack  Continue hydroxyxine  Continue to remain off of generic levothyroxine for now.

## 2022-04-28 NOTE — Telephone Encounter (Signed)
-----   Message from Eugenia Pancoast, [place] sent at 04/28/2022  2:42 PM EST ----- Please call pharmacy and state pt had reaction to generic levothyroxine  Can she have brand name synthroid and or what are their recommendations?

## 2022-05-04 ENCOUNTER — Encounter: Payer: BC Managed Care – PPO | Admitting: Dermatology

## 2022-05-05 ENCOUNTER — Ambulatory Visit
Admission: RE | Admit: 2022-05-05 | Payer: BC Managed Care – PPO | Source: Home / Self Care | Admitting: Gastroenterology

## 2022-05-05 ENCOUNTER — Encounter: Admission: RE | Payer: Self-pay | Source: Home / Self Care

## 2022-05-05 SURGERY — COLONOSCOPY WITH PROPOFOL
Anesthesia: General

## 2022-05-11 ENCOUNTER — Other Ambulatory Visit: Payer: Self-pay | Admitting: Family

## 2022-05-11 DIAGNOSIS — K219 Gastro-esophageal reflux disease without esophagitis: Secondary | ICD-10-CM

## 2022-05-14 ENCOUNTER — Encounter: Payer: Self-pay | Admitting: Dermatology

## 2022-05-14 ENCOUNTER — Ambulatory Visit: Payer: BC Managed Care – PPO | Admitting: Dermatology

## 2022-05-14 VITALS — BP 158/76 | HR 120

## 2022-05-14 DIAGNOSIS — Z85828 Personal history of other malignant neoplasm of skin: Secondary | ICD-10-CM | POA: Diagnosis not present

## 2022-05-14 DIAGNOSIS — L578 Other skin changes due to chronic exposure to nonionizing radiation: Secondary | ICD-10-CM

## 2022-05-14 DIAGNOSIS — L821 Other seborrheic keratosis: Secondary | ICD-10-CM

## 2022-05-14 DIAGNOSIS — D229 Melanocytic nevi, unspecified: Secondary | ICD-10-CM

## 2022-05-14 DIAGNOSIS — R21 Rash and other nonspecific skin eruption: Secondary | ICD-10-CM

## 2022-05-14 DIAGNOSIS — L918 Other hypertrophic disorders of the skin: Secondary | ICD-10-CM

## 2022-05-14 DIAGNOSIS — D225 Melanocytic nevi of trunk: Secondary | ICD-10-CM

## 2022-05-14 DIAGNOSIS — D2271 Melanocytic nevi of right lower limb, including hip: Secondary | ICD-10-CM

## 2022-05-14 DIAGNOSIS — L814 Other melanin hyperpigmentation: Secondary | ICD-10-CM | POA: Diagnosis not present

## 2022-05-14 DIAGNOSIS — L820 Inflamed seborrheic keratosis: Secondary | ICD-10-CM | POA: Diagnosis not present

## 2022-05-14 NOTE — Progress Notes (Signed)
Follow-Up Visit   Subjective  Bridget Reed is a 61 y.o. female who presents for the following: Annual Exam (HxBCC) and Rash (6 week follow up. Using Clobetasol as needed. Uses Tacrolimus ointment, ketoconazole cream. Taking Claritin in morning and Allegra at bedtime. Does not want to stop antihistamines for patch testing. Denies allergies or asthma).  The patient presents for Total-Body Skin Exam (TBSE) for skin cancer screening and mole check.  The patient has spots, moles and lesions to be evaluated, some may be new or changing and the patient has concerns that these could be cancer.   The following portions of the chart were reviewed this encounter and updated as appropriate:      Review of Systems: No other skin or systemic complaints except as noted in HPI or Assessment and Plan.   Objective  Well appearing patient in no apparent distress; mood and affect are within normal limits.  A full examination was performed including scalp, head, eyes, ears, nose, lips, neck, chest, axillae, abdomen, back, buttocks, bilateral upper extremities, bilateral lower extremities, hands, feet, fingers, toes, fingernails, and toenails. All findings within normal limits unless otherwise noted below.  B/L thighs Pink scaly papules at B/L thighs. Erythema with scattered pink papules at chest. Pink scaly patches at B/L upper arms. Pink scaly papules at lower back.  Right Inner Upper Arm x1, right medial inframammary x1 (2) Erythematous keratotic or waxy stuck-on papule   Right sacral area 1.5 mm med dark brown macule   Right Foot - Anterior 5 mm light tan papule  Left Breast Superior Areola 5 mm brown macule with slight irregular border   Assessment & Plan   History of Basal Cell Carcinoma of the Skin. Right chest. 06/17/2017. - No evidence of recurrence today - Recommend regular full body skin exams - Recommend daily broad spectrum sunscreen SPF 30+ to sun-exposed areas, reapply every  2 hours as needed.  - Call if any new or changing lesions are noted between office visits  Lentigines - Scattered tan macules - Due to sun exposure - Benign-appearing, observe - Recommend daily broad spectrum sunscreen SPF 30+ to sun-exposed areas, reapply every 2 hours as needed. - Call for any changes  Seborrheic Keratoses - Stuck-on, waxy, tan-brown papules and/or plaques  - Benign-appearing - Discussed benign etiology and prognosis. - Observe - Call for any changes  Melanocytic Nevi - Tan-brown and/or pink-flesh-colored symmetric macules and papules - Benign appearing on exam today - Observation - Call clinic for new or changing moles - Recommend daily use of broad spectrum spf 30+ sunscreen to sun-exposed areas.   Hemangiomas - Red papules - Discussed benign nature - Observe - Call for any changes  Actinic Damage - Chronic condition, secondary to cumulative UV/sun exposure - diffuse scaly erythematous macules with underlying dyspigmentation - Recommend daily broad spectrum sunscreen SPF 30+ to sun-exposed areas, reapply every 2 hours as needed.  - Staying in the shade or wearing long sleeves, sun glasses (UVA+UVB protection) and wide brim hats (4-inch brim around the entire circumference of the hat) are also recommended for sun protection.  - Call for new or changing lesions.  Skin cancer screening performed today.  Acrochordons (Skin Tags) - Fleshy, skin-colored pedunculated papules - Benign appearing.  - Observe. - If desired, they can be removed with an in office procedure that is not covered by insurance. - Please call the clinic if you notice any new or changing lesions.   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 Condition is bothersome/symptomatic for patient. Currently flared with new areas.  Continue Claritin and Allegra as directed by allergist.    Recommend using Tacrolimus 0.1% ointment to areas of rash twice daily.   Use Clobetasol cream twice daily to spot treat as needed. Try to not use every day.  Recommend patch testing. Do not have to stop antihistamines for patch testing.   Recommend repeat biopsy since morphologic changes in rash. Possible atopic dermatitis.  Related Medications clobetasol cream (TEMOVATE) 0.05 % Apply twice daily for 2 weeks. Avoid applying to face, groin, and axilla.  tacrolimus (PROTOPIC) 0.1 % ointment Apply topically 2 (two) times daily.  Inflamed seborrheic keratosis (2) Right Inner Upper Arm x1, right medial inframammary x1  Symptomatic, irritating, patient would like treated.  Destruction of lesion - Right Inner Upper Arm x1, right medial inframammary x1  Destruction method: cryotherapy   Informed consent: discussed and consent obtained   Lesion destroyed using liquid nitrogen: Yes   Region frozen until ice ball extended beyond lesion: Yes   Outcome: patient tolerated procedure well with no complications   Post-procedure details: wound care instructions given   Additional details:  Prior to procedure, discussed risks of blister formation, small wound, skin dyspigmentation, or rare scar following cryotherapy. Recommend Vaseline ointment to treated areas while healing.   Nevus (3) Right Foot - Anterior; Left Breast Superior Areola; Right sacral area  Benign-appearing. Stable compared to previous visit. Observation.  Call clinic for new or changing moles.  Recommend daily use of broad spectrum spf 30+ sunscreen to sun-exposed areas.     Return in about 1 year (around 05/15/2023) for TBSE, HxBCC.    Patch Testing and biopsy Next Available.  I, Emelia Salisbury, CMA, am acting as scribe for Brendolyn Patty, MD.  Documentation: I have reviewed the above documentation for accuracy and completeness, and I agree with the above.  Brendolyn Patty MD

## 2022-05-14 NOTE — Patient Instructions (Addendum)
Cryotherapy Aftercare  Wash gently with soap and water everyday.   Apply Vaseline and Band-Aid daily until healed.    Continue Claritin and Allegra as directed by allergist.   Recommend using Tacrolimus to areas of rash twice daily.   Use Clobetasol twice daily to spot treat as needed. Try to not use every day.  Recommend patch testing. Do not have to stop antihistamines for patch testing.   Topical steroids (such as triamcinolone, fluocinolone, fluocinonide, mometasone, clobetasol, halobetasol, betamethasone, hydrocortisone) can cause thinning and lightening of the skin if they are used for too long in the same area. Your physician has selected the right strength medicine for your problem and area affected on the body. Please use your medication only as directed by your physician to prevent side effects.    Recommend daily broad spectrum sunscreen SPF 30+ to sun-exposed areas, reapply every 2 hours as needed. Call for new or changing lesions.  Staying in the shade or wearing long sleeves, sun glasses (UVA+UVB protection) and wide brim hats (4-inch brim around the entire circumference of the hat) are also recommended for sun protection.    Melanoma ABCDEs  Melanoma is the most dangerous type of skin cancer, and is the leading cause of death from skin disease.  You are more likely to develop melanoma if you: Have light-colored skin, light-colored eyes, or red or blond hair Spend a lot of time in the sun Tan regularly, either outdoors or in a tanning bed Have had blistering sunburns, especially during childhood Have a close family member who has had a melanoma Have atypical moles or large birthmarks  Early detection of melanoma is key since treatment is typically straightforward and cure rates are extremely high if we catch it early.   The first sign of melanoma is often a change in a mole or a new dark spot.  The ABCDE system is a way of remembering the signs of melanoma.  A for  asymmetry:  The two halves do not match. B for border:  The edges of the growth are irregular. C for color:  A mixture of colors are present instead of an even brown color. D for diameter:  Melanomas are usually (but not always) greater than 21m - the size of a pencil eraser. E for evolution:  The spot keeps changing in size, shape, and color.  Please check your skin once per month between visits. You can use a small mirror in front and a large mirror behind you to keep an eye on the back side or your body.   If you see any new or changing lesions before your next follow-up, please call to schedule a visit.  Please continue daily skin protection including broad spectrum sunscreen SPF 30+ to sun-exposed areas, reapplying every 2 hours as needed when you're outdoors.   Staying in the shade or wearing long sleeves, sun glasses (UVA+UVB protection) and wide brim hats (4-inch brim around the entire circumference of the hat) are also recommended for sun protection.     Due to recent changes in healthcare laws, you may see results of your pathology and/or laboratory studies on MyChart before the doctors have had a chance to review them. We understand that in some cases there may be results that are confusing or concerning to you. Please understand that not all results are received at the same time and often the doctors may need to interpret multiple results in order to provide you with the best plan of care or  course of treatment. Therefore, we ask that you please give [place] 2 business days to thoroughly review all your results before contacting the office for clarification. Should we see a critical lab result, you will be contacted sooner.   If You Need Anything After Your Visit  If you have any questions or concerns for your doctor, please call our main line at [phone] and press option 4 to reach your doctor's medical assistant. If no one answers, please leave a voicemail as directed and we will  return your call as soon as possible. Messages left after 4 pm will be answered the following business day.   You may also send [place] a message via [place]. We typically respond to MyChart messages within 1-2 business days.  For prescription refills, please ask your pharmacy to contact our office. Our fax number is [phone].  If you have an urgent issue when the clinic is closed that cannot wait until the next business day, you can page your doctor at the number below.    Please note that while we do our best to be available for urgent issues outside of office hours, we are not available 24/7.   If you have an urgent issue and are unable to reach [place], you may choose to seek medical care at your doctor's office, retail clinic, urgent care center, or emergency room.  If you have a medical emergency, please immediately call 911 or go to the emergency department.  Pager Numbers  - Dr. Nehemiah Massed: [phone]  - Dr. Laurence Ferrari: [phone]  - Dr. Nicole Kindred: [phone]  In the event of inclement weather, please call our main line at [phone] for an update on the status of any delays or closures.  Dermatology Medication Tips: Please keep the boxes that topical medications come in in order to help keep track of the instructions about where and how to use these. Pharmacies typically print the medication instructions only on the boxes and not directly on the medication tubes.   If your medication is too expensive, please contact our office at [phone] option 4 or send [place] a message through [place].   We are unable to tell what your co-pay for medications will be in advance as this is different depending on your insurance coverage. However, we may be able to find a substitute medication at lower cost or fill out paperwork to get insurance to cover a needed medication.   If a prior authorization is required to get your medication covered by your insurance company, please allow [place] 1-2 business  days to complete this process.  Drug prices often vary depending on where the prescription is filled and some pharmacies may offer cheaper prices.  The website www.goodrx.com contains coupons for medications through different pharmacies. The prices here do not account for what the cost may be with help from insurance (it may be cheaper with your insurance), but the website can give you the price if you did not use any insurance.  - You can print the associated coupon and take it with your prescription to the pharmacy.  - You may also stop by our office during regular business hours and pick up a GoodRx coupon card.  - If you need your prescription sent electronically to a different pharmacy, notify our office through [facility] or by phone at [phone] option 4.     Si Usted Necesita Algo Despus de Su Visita  Tambin puede enviarnos un mensaje a travs de Pharmacist, community. Por lo general respondemos a  los mensajes de MyChart en el transcurso de 1 a 2 das hbiles.  Para renovar recetas, por favor pida a su farmacia que se ponga en contacto con nuestra oficina. Harland Dingwall de fax es [place] [phone].  Si tiene un asunto urgente cuando la clnica est cerrada y que no puede esperar hasta el siguiente da hbil, puede llamar/localizar a su doctor(a) al nmero que aparece a continuacin.   Por favor, tenga en cuenta que aunque hacemos todo lo posible para estar disponibles para asuntos urgentes fuera del horario de [place], no estamos disponibles las 24 horas del da, los 7 das de la [place].   Si tiene un problema urgente y no puede comunicarse con nosotros, puede optar por buscar atencin mdica  en el consultorio de su doctor(a), en una clnica privada, en un centro de atencin urgente o en una sala de emergencias.  Si tiene Engineering geologist, por favor llame inmediatamente al 911 o vaya a la sala de emergencias.  Nmeros de bper  - Dr. Nehemiah Massed: [phone]  - Dra. Moye:  [phone]  - Dra. Nicole Kindred: [phone]  En caso de inclemencias del [place], por favor llame a Johnsie Kindred principal al [phone] para una actualizacin sobre el [place] de cualquier retraso o cierre.  Consejos para la medicacin en dermatologa: Por favor, guarde las cajas en las que vienen los medicamentos de uso tpico para ayudarle a seguir las instrucciones sobre dnde y cmo usarlos. Las farmacias generalmente imprimen las instrucciones del medicamento slo en las cajas y no directamente en los tubos del [place].   Si su medicamento es muy caro, por favor, pngase en contacto con Zigmund Daniel llamando al [phone] y presione la opcin 4 o envenos un mensaje a travs de Pharmacist, community.   No podemos decirle cul ser su copago por los medicamentos por adelantado ya que esto es diferente dependiendo de la cobertura de su seguro. Sin embargo, es posible que podamos encontrar un medicamento sustituto a Electrical engineer un formulario para que el seguro cubra el medicamento que se considera necesario.   Si se requiere una autorizacin previa para que su compaa de seguros [place] su medicamento, por favor permtanos de 1 a 2 das hbiles para completar este proceso.  Los precios de los medicamentos varan con frecuencia dependiendo del Environmental consultant de dnde se surte la receta y alguna farmacias pueden ofrecer precios ms baratos.  El sitio web www.goodrx.com tiene cupones para medicamentos de Airline pilot. Los precios aqu no tienen en cuenta lo que podra costar con la ayuda del seguro (puede ser ms barato con su seguro), pero el sitio web puede darle el precio si no utiliz Research scientist (physical sciences).  - Puede imprimir el cupn correspondiente y llevarlo con su receta a la farmacia.  - Tambin puede pasar por nuestra oficina durante el horario de atencin regular y Charity fundraiser una tarjeta de cupones de GoodRx.  - Si necesita que su receta se enve electrnicamente a una farmacia diferente,  informe a nuestra oficina a travs de MyChart de [place] o por telfono llamando al [phone] y presione la opcin 4.

## 2022-05-22 ENCOUNTER — Other Ambulatory Visit: Payer: Self-pay | Admitting: Family

## 2022-05-22 DIAGNOSIS — E039 Hypothyroidism, unspecified: Secondary | ICD-10-CM

## 2022-05-25 DIAGNOSIS — R7309 Other abnormal glucose: Secondary | ICD-10-CM | POA: Diagnosis not present

## 2022-05-28 ENCOUNTER — Encounter: Payer: Self-pay | Admitting: Adult Health

## 2022-05-28 ENCOUNTER — Ambulatory Visit (INDEPENDENT_AMBULATORY_CARE_PROVIDER_SITE_OTHER): Payer: BC Managed Care – PPO | Admitting: Adult Health

## 2022-05-28 VITALS — BP 142/62 | HR 103 | Temp 97.9°F

## 2022-05-28 DIAGNOSIS — B3731 Acute candidiasis of vulva and vagina: Secondary | ICD-10-CM

## 2022-05-28 DIAGNOSIS — J0140 Acute pansinusitis, unspecified: Secondary | ICD-10-CM

## 2022-05-28 DIAGNOSIS — R051 Acute cough: Secondary | ICD-10-CM

## 2022-05-28 MED ORDER — FLUCONAZOLE 150 MG PO TABS: ORAL_TABLET | ORAL | 0 refills | Status: DC

## 2022-05-28 MED ORDER — AMOXICILLIN-POT CLAVULANATE 875-125 MG PO TABS: 1.0000 | ORAL_TABLET | Freq: Two times a day (BID) | ORAL | 0 refills | Status: DC

## 2022-05-28 MED ORDER — PSEUDOEPH-BROMPHEN-DM 30-2-10 MG/5ML PO SYRP: 5.0000 mL | ORAL_SOLUTION | Freq: Four times a day (QID) | ORAL | 0 refills | Status: DC | PRN

## 2022-05-28 NOTE — Progress Notes (Signed)
[facility] [address], [place] [address]   Office Visit Note  Patient Name: Bridget Reed Date of Birth [id number]  Medical Record number [id number]  Date of Service: 05/28/2022  Chief Complaint  Patient presents with   Cough    Started on 12/26. Cough and both ears clogged. Right ear is worse. Took some prednisone that seem to help some. No fever, has felt bad. Just tired from coughing. No mucus is coming out.     HPI Pt is here for a sick visit. She reports her husband was sick with cough  before christmas.  She started feeling bad on 12/26 and started dayquil.  She denies any fever or chills.  She is now having cough, sinus pressure, and ear pressure.  She has taken nyquil as well with some relief.    Current Medication:  Outpatient Encounter Medications as of 05/28/2022  Medication Sig   amLODipine (NORVASC) 2.5 MG tablet TAKE 1 TABLET (2.5 MG TOTAL) BY MOUTH DAILY. FOR BLOOD PRESSURE >130/>80   amoxicillin-clavulanate (AUGMENTIN) 875-125 MG tablet Take 1 tablet by mouth 2 (two) times daily.   azelastine (ASTELIN) 0.1 % nasal spray Place 1-2 sprays into both nostrils twice daily.  Aim upward and outward.   brompheniramine-pseudoephedrine-DM 30-2-10 MG/5ML syrup Take 5 mLs by mouth 4 (four) times daily as needed.   fexofenadine (ALLEGRA) 60 MG tablet Take 60 mg by mouth 2 (two) times daily.   fluconazole (DIFLUCAN) 150 MG tablet Take one tablet by mouth today and may repeat in 72 hours if symptoms persist.   lisinopril-hydrochlorothiazide (ZESTORETIC) 20-12.5 MG tablet Take 1 tablet by mouth daily. In am if BP >130/>80 02/27/20 take 2 pills in the am each day   loratadine (CLARITIN) 10 MG tablet Take 10 mg by mouth daily.   omeprazole (PRILOSEC) 40 MG capsule TAKE 1 CAPSULE (40 MG TOTAL) BY MOUTH IN THE MORNING 30 MINUTES BEFORE BREAKFAST   clobetasol cream (TEMOVATE) 0.05 % Apply twice daily for 2 weeks. Avoid applying to face, groin, and axilla.   hydrOXYzine  (ATARAX) 10 MG tablet Take 1 tablet (10 mg total) by mouth 3 (three) times daily as needed for itching.   hydrOXYzine (ATARAX) 25 MG tablet Take 1 tablet (25 mg total) by mouth at bedtime as needed.   levothyroxine (SYNTHROID) 88 MCG tablet Take 1 tablet (88 mcg total) by mouth daily before breakfast.   methylPREDNISolone (MEDROL DOSEPAK) 4 MG TBPK tablet Take as directed   tacrolimus (PROTOPIC) 0.1 % ointment Apply topically 2 (two) times daily. (Patient not taking: Reported on 05/28/2022)   No facility-administered encounter medications on file as of 05/28/2022.      Medical History: Past Medical History:  Diagnosis Date   Allergy    Basal cell carcinoma 06/17/2017   right chest    GERD (gastroesophageal reflux disease)    Hypertension    Left tennis elbow    Morbid obesity ([place]) 07/13/2016     Vital Signs: BP (!) 142/62 (BP Location: Left Arm, Patient Position: Sitting, Cuff Size: Large)   Pulse (!) 103   Temp 97.9 F (36.6 C) (Tympanic)   LMP 11/22/2013 (Approximate)   SpO2 98%    Review of Systems  Constitutional:  Negative for chills, fatigue and fever.  HENT:  Positive for ear pain and sinus pressure. Negative for sore throat and trouble swallowing.   Eyes:  Negative for pain and itching.  Respiratory:  Positive for cough.   Cardiovascular:  Negative  for chest pain.  Gastrointestinal:  Negative for diarrhea, nausea and vomiting.    Physical Exam Vitals and nursing note reviewed.  Constitutional:      Appearance: Normal appearance.  HENT:     Right Ear: Tympanic membrane and ear canal normal.     Left Ear: Tympanic membrane and ear canal normal.     Nose: Congestion present.     Right Sinus: Maxillary sinus tenderness and frontal sinus tenderness present.     Left Sinus: Maxillary sinus tenderness and frontal sinus tenderness present.     Mouth/Throat:     Mouth: Mucous membranes are moist.  Eyes:     Pupils: Pupils are equal, round, and reactive to light.   Cardiovascular:     Rate and Rhythm: Normal rate.  Pulmonary:     Effort: Pulmonary effort is normal.     Breath sounds: Normal breath sounds.  Lymphadenopathy:     Cervical: No cervical adenopathy.  Neurological:     Mental Status: She is alert.     Assessment/Plan: 1. Acute non-recurrent pansinusitis Patient Instructions: -Take complete course of antibiotics as prescribed.  Take with food.  -Try Flonase/Fluticasone nasal spray, 2 sprays to each nostril once a day. -You can try using a neti pot or nasal saline rinse product to help clear mucus congestion. -Rest and stay well hydrated (by drinking water and other liquids). Avoid/limit caffeine. -Take over-the-counter medicines (i.e. Mucinex, decongestant, Ibuprofen or Tylenol, cough suppressant) to help relieve your symptoms. -For your cough, use cough drops/throat lozenges, gargle warm salt water and/or drink warm liquids (like tea with honey). -Send my chart message to provider or schedule return visit as needed for new/worsening symptoms or if symptoms do not improve as discussed with antibiotic and other recommended treatment.   - amoxicillin-clavulanate (AUGMENTIN) 875-125 MG tablet; Take 1 tablet by mouth 2 (two) times daily.  Dispense: 20 tablet; Refill: 0  2. Acute cough Take bromfed as discussed.  Do not use with Hydroxyzine.  Monitor blood pressure as discussed when taking bromfed.   - brompheniramine-pseudoephedrine-DM 30-2-10 MG/5ML syrup; Take 5 mLs by mouth 4 (four) times daily as needed.  Dispense: 120 mL; Refill: 0  3. Vaginal candida Take Diflucan as directed, if needed due to yeast secondary to antibiotic use.  - fluconazole (DIFLUCAN) 150 MG tablet; Take one tablet by mouth today and may repeat in 72 hours if symptoms persist.  Dispense: 2 tablet; Refill: 0     General Counseling: fiorella hanahan understanding of the findings of todays visit and agrees with plan of treatment. I have discussed any further  diagnostic evaluation that may be needed or ordered today. We also reviewed her medications today. she has been encouraged to call the office with any questions or concerns that should arise related to todays visit.   No orders of the defined types were placed in this encounter.   Meds ordered this encounter  Medications   brompheniramine-pseudoephedrine-DM 30-2-10 MG/5ML syrup    Sig: Take 5 mLs by mouth 4 (four) times daily as needed.    Dispense:  120 mL    Refill:  0   amoxicillin-clavulanate (AUGMENTIN) 875-125 MG tablet    Sig: Take 1 tablet by mouth 2 (two) times daily.    Dispense:  20 tablet    Refill:  0   fluconazole (DIFLUCAN) 150 MG tablet    Sig: Take one tablet by mouth today and may repeat in 72 hours if symptoms persist.    Dispense:  2 tablet    Refill:  0    Time spent:20 Minutes    Kendell Bane AGNP-C Nurse Practitioner

## 2022-06-03 ENCOUNTER — Encounter: Payer: Self-pay | Admitting: Dermatology

## 2022-06-03 ENCOUNTER — Ambulatory Visit: Payer: BC Managed Care – PPO | Admitting: Dermatology

## 2022-06-03 VITALS — BP 165/81 | HR 100

## 2022-06-03 DIAGNOSIS — L209 Atopic dermatitis, unspecified: Secondary | ICD-10-CM

## 2022-06-03 DIAGNOSIS — R21 Rash and other nonspecific skin eruption: Secondary | ICD-10-CM

## 2022-06-03 MED ORDER — PIMECROLIMUS 1 % EX CREA: TOPICAL_CREAM | CUTANEOUS | 2 refills | Status: DC

## 2022-06-03 NOTE — Progress Notes (Unsigned)
   Follow-Up Visit   Subjective  Bridget Reed is a 62 y.o. female who presents for the following: Rash (Here for biopsy of rash today to see if shows eczema. Has been using Aveeno Eczema Therapy body wash and lotions. States rash is 60% or more improved. Uses Clobetasol cream for stubborn areas/flares. Does not like using Tacrolimus, too greasy on skin. Hx of allergy shots when younger ).    The following portions of the chart were reviewed this encounter and updated as appropriate:      Review of Systems: No other skin or systemic complaints except as noted in HPI or Assessment and Plan.   Objective  Well appearing patient in no apparent distress; mood and affect are within normal limits.  A focused examination was performed including arms, legs. Relevant physical exam findings are noted in the Assessment and Plan.  Right Lower Leg - Anterior Few light pink papules scattered at thighs, few at upper arms. Mild erythema with associated xerosis   Assessment & Plan  Rash Right Lower Leg - Anterior  Chronic condition with duration or expected duration over one year. Currently well-controlled.  Continue Aveeno Eczema Therapy body wash and moisturizer.   Use Clobetasol cream as needed for stubborn areas, flare ups.   Start Pimecrolimus cream twice daily to affected areas.   Topical steroids (such as triamcinolone, fluocinolone, fluocinonide, mometasone, clobetasol, halobetasol, betamethasone, hydrocortisone) can cause thinning and lightening of the skin if they are used for too long in the same area. Your physician has selected the right strength medicine for your problem and area affected on the body. Please use your medication only as directed by your physician to prevent side effects.    pimecrolimus (ELIDEL) 1 % cream - Right Lower Leg - Anterior Apply twice daily to affected areas  Related Medications clobetasol cream (TEMOVATE) 0.05 % Apply twice daily for 2 weeks.  Avoid applying to face, groin, and axilla.  tacrolimus (PROTOPIC) 0.1 % ointment Apply topically 2 (two) times daily.   Return if symptoms worsen or fail to improve.  I, Emelia Salisbury, CMA, am acting as scribe for Brendolyn Patty, MD.

## 2022-06-03 NOTE — Patient Instructions (Signed)
Continue Aveeno Eczema Therapy body wash and moisturizer.   Use Clobetasol cream as needed for stubborn areas, flare ups.   Start Pimecrolimus cream twice daily to affected areas.   Topical steroids (such as triamcinolone, fluocinolone, fluocinonide, mometasone, clobetasol, halobetasol, betamethasone, hydrocortisone) can cause thinning and lightening of the skin if they are used for too long in the same area. Your physician has selected the right strength medicine for your problem and area affected on the body. Please use your medication only as directed by your physician to prevent side effects.     Gentle Skin Care Guide  1. Bathe no more than once a day.  2. Avoid bathing in hot water  3. Use a mild soap like Dove, Vanicream, Cetaphil, CeraVe. Can use Lever 2000 or Cetaphil antibacterial soap  4. Use soap only where you need it. On most days, use it under your arms, between your legs, and on your feet. Let the water rinse other areas unless visibly dirty.  5. When you get out of the bath/shower, use a towel to gently blot your skin dry, don't rub it.  6. While your skin is still a little damp, apply a moisturizing cream such as Vanicream, CeraVe, Cetaphil, Eucerin, Sarna lotion or plain Vaseline Jelly. For hands apply Neutrogena [place] Hand Cream or Excipial Hand Cream.  7. Reapply moisturizer any time you start to itch or feel dry.  8. Sometimes using free and clear laundry detergents can be helpful. Fabric softener sheets should be avoided. Downy Free & Gentle liquid, or any liquid fabric softener that is free of dyes and perfumes, it acceptable to use  9. If your doctor has given you prescription creams you may apply moisturizers over them     Due to recent changes in healthcare laws, you may see results of your pathology and/or laboratory studies on MyChart before the doctors have had a chance to review them. We understand that in some cases there may be results that are  confusing or concerning to you. Please understand that not all results are received at the same time and often the doctors may need to interpret multiple results in order to provide you with the best plan of care or course of treatment. Therefore, we ask that you please give [place] 2 business days to thoroughly review all your results before contacting the office for clarification. Should we see a critical lab result, you will be contacted sooner.   If You Need Anything After Your Visit  If you have any questions or concerns for your doctor, please call our main line at [phone] and press option 4 to reach your doctor's medical assistant. If no one answers, please leave a voicemail as directed and we will return your call as soon as possible. Messages left after 4 pm will be answered the following business day.   You may also send [place] a message via [place]. We typically respond to MyChart messages within 1-2 business days.  For prescription refills, please ask your pharmacy to contact our office. Our fax number is [phone].  If you have an urgent issue when the clinic is closed that cannot wait until the next business day, you can page your doctor at the number below.    Please note that while we do our best to be available for urgent issues outside of office hours, we are not available 24/7.   If you have an urgent issue and are unable to reach [place], you may choose to seek  medical care at your doctor's office, retail clinic, urgent care center, or emergency room.  If you have a medical emergency, please immediately call 911 or go to the emergency department.  Pager Numbers  - Dr. Nehemiah Massed: [phone]  - Dr. Laurence Ferrari: [phone]  - Dr. Nicole Kindred: [phone]  In the event of inclement weather, please call our main line at [phone] for an update on the status of any delays or closures.  Dermatology Medication Tips: Please keep the boxes that topical medications come in in order to  help keep track of the instructions about where and how to use these. Pharmacies typically print the medication instructions only on the boxes and not directly on the medication tubes.   If your medication is too expensive, please contact our office at [phone] option 4 or send [place] a message through [place].   We are unable to tell what your co-pay for medications will be in advance as this is different depending on your insurance coverage. However, we may be able to find a substitute medication at lower cost or fill out paperwork to get insurance to cover a needed medication.   If a prior authorization is required to get your medication covered by your insurance company, please allow [place] 1-2 business days to complete this process.  Drug prices often vary depending on where the prescription is filled and some pharmacies may offer cheaper prices.  The website www.goodrx.com contains coupons for medications through different pharmacies. The prices here do not account for what the cost may be with help from insurance (it may be cheaper with your insurance), but the website can give you the price if you did not use any insurance.  - You can print the associated coupon and take it with your prescription to the pharmacy.  - You may also stop by our office during regular business hours and pick up a GoodRx coupon card.  - If you need your prescription sent electronically to a different pharmacy, notify our office through [facility] or by phone at [phone] option 4.     Si Usted Necesita Algo Despus de Su Visita  Tambin puede enviarnos un mensaje a travs de Pharmacist, community. Por lo general respondemos a los mensajes de MyChart en el transcurso de 1 a 2 das hbiles.  Para renovar recetas, por favor pida a su farmacia que se ponga en contacto con nuestra oficina. Harland Dingwall de fax es [place] [phone].  Si tiene un asunto urgente cuando la clnica est cerrada y que no puede esperar hasta  el siguiente da hbil, puede llamar/localizar a su doctor(a) al nmero que aparece a continuacin.   Por favor, tenga en cuenta que aunque hacemos todo lo posible para estar disponibles para asuntos urgentes fuera del horario de [place], no estamos disponibles las 24 horas del da, los 7 das de la [employer].   Si tiene un problema urgente y no puede comunicarse con nosotros, puede optar por buscar atencin mdica  en el consultorio de su doctor(a), en una clnica privada, en un centro de atencin urgente o en una sala de emergencias.  Si tiene Engineering geologist, por favor llame inmediatamente al 911 o vaya a la sala de emergencias.  Nmeros de bper  - Dr. Nehemiah Massed: [phone]  - Dra. Moye: [phone]  - Dra. Nicole Kindred: [phone]  En caso de inclemencias del [place], por favor llame a Johnsie Kindred principal al [phone] para una actualizacin sobre el [place] de cualquier retraso o cierre.  Consejos para la  medicacin en dermatologa: Por favor, guarde las cajas en las que vienen los medicamentos de uso tpico para ayudarle a seguir las instrucciones sobre dnde y cmo usarlos. Las farmacias generalmente imprimen las instrucciones del medicamento slo en las cajas y no directamente en los tubos del [place].   Si su medicamento es muy caro, por favor, pngase en contacto con Zigmund Daniel llamando al [phone] y presione la opcin 4 o envenos un mensaje a travs de Pharmacist, community.   No podemos decirle cul ser su copago por los medicamentos por adelantado ya que esto es diferente dependiendo de la cobertura de su seguro. Sin embargo, es posible que podamos encontrar un medicamento sustituto a Electrical engineer un formulario para que el seguro cubra el medicamento que se considera necesario.   Si se requiere una autorizacin previa para que su compaa de seguros [place] su medicamento, por favor permtanos de 1 a 2 das hbiles para completar este proceso.  Los precios de los  medicamentos varan con frecuencia dependiendo del Environmental consultant de dnde se surte la receta y alguna farmacias pueden ofrecer precios ms baratos.  El sitio web www.goodrx.com tiene cupones para medicamentos de Airline pilot. Los precios aqu no tienen en cuenta lo que podra costar con la ayuda del seguro (puede ser ms barato con su seguro), pero el sitio web puede darle el precio si no utiliz Research scientist (physical sciences).  - Puede imprimir el cupn correspondiente y llevarlo con su receta a la farmacia.  - Tambin puede pasar por nuestra oficina durante el horario de atencin regular y Charity fundraiser una tarjeta de cupones de GoodRx.  - Si necesita que su receta se enve electrnicamente a una farmacia diferente, informe a nuestra oficina a travs de MyChart de [place] o por telfono llamando al [phone] y presione la opcin 4.

## 2022-06-10 ENCOUNTER — Ambulatory Visit: Payer: BC Managed Care – PPO | Admitting: Family

## 2022-06-12 ENCOUNTER — Other Ambulatory Visit: Payer: Self-pay | Admitting: Adult Health

## 2022-06-12 DIAGNOSIS — R051 Acute cough: Secondary | ICD-10-CM

## 2022-06-15 ENCOUNTER — Encounter: Payer: Self-pay | Admitting: Family

## 2022-06-15 ENCOUNTER — Ambulatory Visit: Payer: BC Managed Care – PPO | Admitting: Family

## 2022-06-15 VITALS — BP 130/72 | HR 79 | Temp 98.0°F | Ht 61.0 in | Wt 258.0 lb

## 2022-06-15 DIAGNOSIS — K219 Gastro-esophageal reflux disease without esophagitis: Secondary | ICD-10-CM | POA: Diagnosis not present

## 2022-06-15 DIAGNOSIS — J00 Acute nasopharyngitis [common cold]: Secondary | ICD-10-CM | POA: Diagnosis not present

## 2022-06-15 DIAGNOSIS — L2989 Other pruritus: Secondary | ICD-10-CM

## 2022-06-15 DIAGNOSIS — L298 Other pruritus: Secondary | ICD-10-CM | POA: Diagnosis not present

## 2022-06-15 DIAGNOSIS — E039 Hypothyroidism, unspecified: Secondary | ICD-10-CM | POA: Diagnosis not present

## 2022-06-15 MED ORDER — OMEPRAZOLE 40 MG PO CPDR: 40.0000 mg | DELAYED_RELEASE_CAPSULE | Freq: Every day | ORAL | 1 refills | Status: DC

## 2022-06-15 MED ORDER — PREDNISONE 20 MG PO TABS: ORAL_TABLET | ORAL | 0 refills | Status: DC

## 2022-06-15 MED ORDER — AMOXICILLIN-POT CLAVULANATE 875-125 MG PO TABS: 1.0000 | ORAL_TABLET | Freq: Two times a day (BID) | ORAL | 0 refills | Status: AC

## 2022-06-15 MED ORDER — TIROSINT-SOL 88 MCG/ML PO SOLN: 88.0000 ug | Freq: Every day | ORAL | 1 refills | Status: DC

## 2022-06-15 NOTE — Patient Instructions (Addendum)
  Recommend mucinex over the counter.   ------------------------------------  Call [facility] in [place]  [phone]  ------------------------------------  Start tirosint in place of levothyroxine, let me know if any concerns.   Regards,   Eugenia Pancoast FNP-C

## 2022-06-15 NOTE — Assessment & Plan Note (Signed)
Unable to tolerate levothyroxine. Will try tirosint pending insurance. Advised pt to repeat tsh in 6 weeks.

## 2022-06-15 NOTE — Assessment & Plan Note (Signed)
Refill protonix 40 mg once daily Try to decrease and or avoid spicy foods, fried fatty foods, and also caffeine and chocolate as these can increase heartburn symptoms.  Did d/w pt long term use PPI not overly desired as increased risk for osteoporosis. Pt pending f/u with GI.

## 2022-06-15 NOTE — Progress Notes (Signed)
Established Patient Office Visit  Subjective:  Patient ID: Bridget Reed, female    DOB: 19-Nov-1960  Age: 62 y.o. MRN: [id number]  CC:  Chief Complaint  Patient presents with   Hypothyroidism    HPI Bridget Reed is here today for follow up.   Pt is with acute concerns.  Hypothyroid: states that unable to tolerate levothyroxine, she keeps breaking out in a rash. However she finds that she is breaking out often. Last time she saw allergies was 11/23 and they didn't find anything out of the normal. She is on a daily antihistamine. Levothyroxine, and synthroid still with rash.   Also with URI symptoms, was placed on an antbx , Augmentin but she only took three days worth. She got better for a few days but then traveled recently for work , and during this time it started ramping back up. Most recently sx started about  She is on flonase and nightly antihistamine. No sore throat. Bil ear pain. Mild chest congestion, non productive .  Gerd: pending colonoscopy. She was recently increased to 40 mg once daily.   Past Medical History:  Diagnosis Date   Allergy    Basal cell carcinoma 06/17/2017   right chest    GERD (gastroesophageal reflux disease)    Hypertension    Left tennis elbow    Morbid obesity ([place]) 07/13/2016    Past Surgical History:  Procedure Laterality Date   COLONOSCOPY     COLONOSCOPY WITH PROPOFOL N/A 10/14/2015   Procedure: COLONOSCOPY WITH PROPOFOL;  Surgeon: Lucilla Lame, MD;  Location: [facility];  Service: Endoscopy;  Laterality: N/A;   NO PAST SURGERIES      Family History  Problem Relation Age of Onset   Heart failure Mother    Pancreatic cancer Mother    Osteopenia Mother    Arthritis Mother    Kidney cancer Father    Heart disease Father    Hypertension Father    Hypertension Brother    Atrial fibrillation Brother    Heart disease Maternal Grandmother    Leukemia Maternal Grandfather    Breast cancer Paternal Aunt 59   Colon  cancer Paternal Aunt     Social History   Socioeconomic History   Marital status: Married    Spouse name: Not on file   Number of children: 0   Years of education: Not on file   Highest education level: Not on file  Occupational History    Employer: [employer]  Tobacco Use   Smoking status: Never   Smokeless tobacco: Never  Vaping Use   Vaping Use: Never used  Substance and Sexual Activity   Alcohol use: Yes    Alcohol/week: 9.0 standard drinks of alcohol    Types: 9 Glasses of wine per week    Comment: 3-4 glasses of wine about three days a week   Drug use: No   Sexual activity: Yes    Partners: Male    Birth control/protection: Post-menopausal  Other Topics Concern   Not on file  Social History Narrative   Diet: moderate.. Working on improving .   Walks 3-4 times a week.    No kids 1 step daughter married    Works Conservator, museum/gallery of Radio broadcast assistant Strain: Not on [employer] Insecurity: Not on file  Transportation Needs: Not on file  Physical Activity: Not on file  Stress: Not on file  Social Connections: Not on file  Intimate Partner Violence: Not on file    Outpatient Medications Prior to Visit  Medication Sig Dispense Refill   amLODipine (NORVASC) 2.5 MG tablet TAKE 1 TABLET (2.5 MG TOTAL) BY MOUTH DAILY. FOR BLOOD PRESSURE >130/>80 90 tablet 3   azelastine (ASTELIN) 0.1 % nasal spray Place 1-2 sprays into both nostrils twice daily.  Aim upward and outward. 30 mL 5   clobetasol cream (TEMOVATE) 0.05 % Apply twice daily for 2 weeks. Avoid applying to face, groin, and axilla. 60 g 2   fexofenadine (ALLEGRA) 60 MG tablet Take 60 mg by mouth 2 (two) times daily.     fluconazole (DIFLUCAN) 150 MG tablet Take one tablet by mouth today and may repeat in 72 hours if symptoms persist. 2 tablet 0   hydrOXYzine (ATARAX) 25 MG tablet Take 1 tablet (25 mg total) by mouth at bedtime as needed. 30 tablet 5   lisinopril-hydrochlorothiazide  (ZESTORETIC) 20-12.5 MG tablet Take 1 tablet by mouth daily. In am if BP >130/>80 02/27/20 take 2 pills in the am each day 180 tablet 3   loratadine (CLARITIN) 10 MG tablet Take 10 mg by mouth daily.     pimecrolimus (ELIDEL) 1 % cream Apply twice daily to affected areas 100 g 2   tacrolimus (PROTOPIC) 0.1 % ointment Apply topically 2 (two) times daily. 100 g 2   brompheniramine-pseudoephedrine-DM 30-2-10 MG/5ML syrup Take 5 mLs by mouth 4 (four) times daily as needed. 120 mL 0   omeprazole (PRILOSEC) 40 MG capsule TAKE 1 CAPSULE (40 MG TOTAL) BY MOUTH IN THE MORNING 30 MINUTES BEFORE BREAKFAST 30 capsule 0   amoxicillin-clavulanate (AUGMENTIN) 875-125 MG tablet Take 1 tablet by mouth 2 (two) times daily. 20 tablet 0   hydrOXYzine (ATARAX) 10 MG tablet Take 1 tablet (10 mg total) by mouth 3 (three) times daily as needed for itching. (Patient not taking: Reported on 06/15/2022) 30 tablet 1   levothyroxine (SYNTHROID) 88 MCG tablet Take 1 tablet (88 mcg total) by mouth daily before breakfast. 30 tablet 0   methylPREDNISolone (MEDROL DOSEPAK) 4 MG TBPK tablet Take as directed 1 each 0   No facility-administered medications prior to visit.    Allergies  Allergen Reactions   Mobic [Meloxicam] Swelling    In hands and ankles and under eyes.   Polytrim [Polymyxin B-Trimethoprim] Itching    Itching of eyes after use. Patient allergic to sulfa   Azithromycin Rash   Erythromycin     Rash, GI upset   Influenza Vac Split Quad Hives    Possible allergy- rash after injection Still takes yearly flu vaccine   Shrimp [Shellfish Allergy] Other (See Comments)    Headache when eaten in excess   Sulfa Antibiotics Other (See Comments)    Unknown, can not remember   Synthroid [Levothyroxine] Hives    Brand and generic       Review of Systems  ROS: Pertinent symptoms negative unless otherwise noted in HPI      Objective:    Physical Exam Vitals reviewed.  Constitutional:      Appearance: Normal  appearance. She is obese.  HENT:     Right Ear: Hearing and external ear normal. Tympanic membrane is retracted.     Left Ear: Hearing and external ear normal. Tympanic membrane is retracted.  Eyes:     General:        Right eye: No discharge.        Left eye: No discharge.     Conjunctiva/sclera: Conjunctivae  normal.  Cardiovascular:     Rate and Rhythm: Normal rate.  Pulmonary:     Effort: Pulmonary effort is normal. No respiratory distress.  Musculoskeletal:        General: Normal range of motion.     Cervical back: Normal range of motion.  Neurological:     General: No focal deficit present.     Mental Status: She is alert and oriented to person, place, and time. Mental status is at baseline.  Psychiatric:        Mood and Affect: Mood normal.        Behavior: Behavior normal.        Thought Content: Thought content normal.        Judgment: Judgment normal.       BP 130/72   Pulse 79   Temp 98 F (36.7 C) (Oral)   Ht '5\' 1"'$  (1.549 m)   Wt 258 lb (117 kg)   LMP 11/22/2013 (Approximate)   SpO2 98%   BMI 48.75 kg/m  Wt Readings from Last 3 Encounters:  06/15/22 258 lb (117 kg)  04/28/22 259 lb 2 oz (117.5 kg)  03/16/22 257 lb 4 oz (116.7 kg)     Health Maintenance Due  Topic Date Due   DTaP/Tdap/Td (2 - Tdap) 10/24/2019   COVID-19 Vaccine (5 - 2023-24 season) 01/23/2022   PAP SMEAR-Modifier  07/17/2022    There are no preventive care reminders to display for this patient.  Lab Results  Component Value Date   TSH 3.81 04/01/2022   Lab Results  Component Value Date   WBC 9.5 04/01/2022   HGB 14.5 04/01/2022   HCT 42.5 04/01/2022   MCV 89.8 04/01/2022   PLT 255.0 04/01/2022   Lab Results  Component Value Date   NA 134 (L) 02/16/2022   K 3.8 02/16/2022   CO2 30 02/16/2022   GLUCOSE 110 (H) 02/16/2022   BUN 15 02/16/2022   CREATININE 0.69 02/16/2022   BILITOT 0.6 02/16/2022   ALKPHOS 75 02/16/2022   AST 16 02/16/2022   ALT 22 02/16/2022   PROT  6.8 02/16/2022   ALBUMIN 4.2 02/16/2022   CALCIUM 9.8 02/16/2022   EGFR 105 01/07/2022   GFR 94.53 02/16/2022   Lab Results  Component Value Date   CHOL 181 01/07/2022   Lab Results  Component Value Date   HDL 53 01/07/2022   Lab Results  Component Value Date   LDLCALC 106 (H) 01/07/2022   Lab Results  Component Value Date   TRIG 121 01/07/2022   Lab Results  Component Value Date   CHOLHDL 3.4 01/07/2022   Lab Results  Component Value Date   HGBA1C 5.6 01/07/2022      Assessment & Plan:   Problem List Items Addressed This Visit       Respiratory   Acute nasopharyngitis - Primary    Take antibiotic as prescribed. Increase oral fluids. Pt to f/u if sx worsen and or fail to improve in 2-3 days. Continue augmentin x 10 total days Continue prednisone 40 mg x 3 more days for total five days Recommend otc mucinex without DM.  Continue flonase 50 mcg      Relevant Medications   amoxicillin-clavulanate (AUGMENTIN) 875-125 MG tablet   predniSONE (DELTASONE) 20 MG tablet     Digestive   Gastroesophageal reflux disease    Refill protonix 40 mg once daily Try to decrease and or avoid spicy foods, fried fatty foods, and also caffeine and chocolate as  these can increase heartburn symptoms.  Did d/w pt long term use PPI not overly desired as increased risk for osteoporosis. Pt pending f/u with GI.      Relevant Medications   omeprazole (PRILOSEC) 40 MG capsule     Endocrine   Acquired hypothyroidism    Unable to tolerate levothyroxine. Will try tirosint pending insurance. Advised pt to repeat tsh in 6 weeks.      Relevant Medications   Levothyroxine Sodium (TIROSINT-SOL) 88 MCG/ML SOLN   Other Relevant Orders   TSH     Musculoskeletal and Integument   Chronic pruritic rash in adult    Meds ordered this encounter  Medications   amoxicillin-clavulanate (AUGMENTIN) 875-125 MG tablet    Sig: Take 1 tablet by mouth 2 (two) times daily for 3 days.    Dispense:   6 tablet    Refill:  0    Order Specific Question:   Supervising Provider    Answer:   BEDSOLE, AMY E [[id number]]   predniSONE (DELTASONE) 20 MG tablet    Sig: Take two tablets once daily for five days    Dispense:  10 tablet    Refill:  0    Order Specific Question:   Supervising Provider    Answer:   BEDSOLE, AMY E [[id number]]   Levothyroxine Sodium (TIROSINT-SOL) 88 MCG/ML SOLN    Sig: Take 88 mcg by mouth daily.    Dispense:  30 mL    Refill:  1    Order Specific Question:   Supervising Provider    Answer:   BEDSOLE, AMY E [[id number]]   omeprazole (PRILOSEC) 40 MG capsule    Sig: Take 1 capsule (40 mg total) by mouth daily.    Dispense:  90 capsule    Refill:  1    Order Specific Question:   Supervising Provider    Answer:   Diona Browner, AMY E [[id number]]    Follow-up: Return in about 6 months (around 12/14/2022) for f/u thyroid .    Eugenia Pancoast, FNP

## 2022-06-15 NOTE — Assessment & Plan Note (Signed)
Take antibiotic as prescribed. Increase oral fluids. Pt to f/u if sx worsen and or fail to improve in 2-3 days. Continue augmentin x 10 total days Continue prednisone 40 mg x 3 more days for total five days Recommend otc mucinex without DM.  Continue flonase 50 mcg

## 2022-06-23 ENCOUNTER — Ambulatory Visit (INDEPENDENT_AMBULATORY_CARE_PROVIDER_SITE_OTHER): Payer: BC Managed Care – PPO | Admitting: Physician Assistant

## 2022-06-23 VITALS — BP 154/70 | HR 103 | Temp 97.1°F | Resp 17

## 2022-06-23 DIAGNOSIS — H6992 Unspecified Eustachian tube disorder, left ear: Secondary | ICD-10-CM

## 2022-06-23 DIAGNOSIS — J309 Allergic rhinitis, unspecified: Secondary | ICD-10-CM

## 2022-06-23 DIAGNOSIS — I10 Essential (primary) hypertension: Secondary | ICD-10-CM

## 2022-06-23 MED ORDER — MONTELUKAST SODIUM 10 MG PO TABS: 10.0000 mg | ORAL_TABLET | Freq: Every day | ORAL | 3 refills | Status: DC

## 2022-06-23 MED ORDER — METHYLPREDNISOLONE 4 MG PO TBPK: ORAL_TABLET | ORAL | 0 refills | Status: DC

## 2022-06-23 NOTE — Progress Notes (Signed)
[facility] [address],  [address]   Office Visit Note  Patient Name: Bridget Reed Date of Birth [id number]  Medical Record number [id number]  Date of Service: 06/23/2022  Chief Complaint  Patient presents with   Ear Fullness     62 y/o F presents to the clinic for c/o left ear "clogged" x 2 weeks. Prior to her symptom she was traveling by air. Feels like water inside her ear. Denies ear drainage, pain, dizziness, nausea, or vomiting. No recent dental procedures.       Current Medication:  Outpatient Encounter Medications as of 06/23/2022  Medication Sig   methylPREDNISolone (MEDROL DOSEPAK) 4 MG TBPK tablet Take as directed   montelukast (SINGULAIR) 10 MG tablet Take 1 tablet (10 mg total) by mouth at bedtime.   amLODipine (NORVASC) 2.5 MG tablet TAKE 1 TABLET (2.5 MG TOTAL) BY MOUTH DAILY. FOR BLOOD PRESSURE >130/>80   azelastine (ASTELIN) 0.1 % nasal spray Place 1-2 sprays into both nostrils twice daily.  Aim upward and outward.   clobetasol cream (TEMOVATE) 0.05 % Apply twice daily for 2 weeks. Avoid applying to face, groin, and axilla.   fexofenadine (ALLEGRA) 60 MG tablet Take 60 mg by mouth 2 (two) times daily.   fluconazole (DIFLUCAN) 150 MG tablet Take one tablet by mouth today and may repeat in 72 hours if symptoms persist.   hydrOXYzine (ATARAX) 25 MG tablet Take 1 tablet (25 mg total) by mouth at bedtime as needed.   Levothyroxine Sodium (TIROSINT-SOL) 88 MCG/ML SOLN Take 88 mcg by mouth daily.   lisinopril-hydrochlorothiazide (ZESTORETIC) 20-12.5 MG tablet Take 1 tablet by mouth daily. In am if BP >130/>80 02/27/20 take 2 pills in the am each day   loratadine (CLARITIN) 10 MG tablet Take 10 mg by mouth daily.   omeprazole (PRILOSEC) 40 MG capsule Take 1 capsule (40 mg total) by mouth daily.   pimecrolimus (ELIDEL) 1 % cream Apply twice daily to affected areas   tacrolimus (PROTOPIC) 0.1 % ointment Apply topically 2 (two) times daily.    [DISCONTINUED] predniSONE (DELTASONE) 20 MG tablet Take two tablets once daily for five days   No facility-administered encounter medications on file as of 06/23/2022.      Medical History: Past Medical History:  Diagnosis Date   Allergy    Basal cell carcinoma 06/17/2017   right chest    GERD (gastroesophageal reflux disease)    Hypertension    Left tennis elbow    Morbid obesity ([place]) 07/13/2016     Vital Signs: BP (!) 154/70 (BP Location: Left Arm, Patient Position: Sitting, Cuff Size: Large)   Pulse (!) 103   Temp (!) 97.1 F (36.2 C) (Tympanic)   Resp 17   LMP 11/22/2013 (Approximate)   SpO2 98%    Review of Systems  Constitutional: Negative.   HENT:  Positive for congestion. Negative for dental problem, ear discharge, ear pain, rhinorrhea, sinus pressure, sinus pain, sore throat and trouble swallowing.   Respiratory: Negative.    Cardiovascular: Negative.   Neurological: Negative.     Physical Exam Constitutional:      Appearance: Normal appearance.  HENT:     Head: Atraumatic.     Right Ear: Tympanic membrane, ear canal and external ear normal. Tympanic membrane is not perforated, erythematous or bulging.     Left Ear: Tympanic membrane, ear canal and external ear normal. Tympanic membrane is not perforated, erythematous or bulging.     Ears:  Comments: Both ear canals are clear without cerumen impaction.     Nose: Nose normal.     Right Turbinates: Enlarged and swollen.     Left Turbinates: Enlarged and swollen.     Mouth/Throat:     Mouth: Mucous membranes are moist.     Pharynx: Oropharynx is clear.  Eyes:     Extraocular Movements: Extraocular movements intact.  Cardiovascular:     Rate and Rhythm: Normal rate and regular rhythm.  Pulmonary:     Effort: Pulmonary effort is normal.     Breath sounds: Normal breath sounds.  Musculoskeletal:     Cervical back: Neck supple.  Skin:    General: Skin is warm.  Neurological:     Mental Status: She  is alert.  Psychiatric:        Mood and Affect: Mood normal.        Behavior: Behavior normal.        Thought Content: Thought content normal.        Judgment: Judgment normal.       Assessment/Plan:  1. Eustachian tube disorder, left - methylPREDNISolone (MEDROL DOSEPAK) 4 MG TBPK tablet; Take as directed  Dispense: 1 each; Refill: 0  2. Allergic rhinitis, unspecified seasonality, unspecified trigger - montelukast (SINGULAIR) 10 MG tablet; Take 1 tablet (10 mg total) by mouth at bedtime.  Dispense: 30 tablet; Refill: 3  3. Elevated blood pressure reading in office with diagnosis of hypertension  Stay well hydrated.  Reviewed my clinical findings with patient.  Recommend Afrin nasal decongestant Discontinue the use of oral Sudafed because elevated blood pressure reading. Start oral steroids if Afrin doesn't help in 2-3 days and take Montelukast as prescribed. Pt verbalized understanding and in agreement.   General Counseling: Bridget Reed understanding of the findings of todays visit and agrees with plan of treatment. I have discussed any further diagnostic evaluation that may be needed or ordered today. We also reviewed her medications today. she has been encouraged to call the office with any questions or concerns that should arise related to todays visit.    Time spent:20 [place], [place] Physician Assistant

## 2022-06-25 DIAGNOSIS — R7309 Other abnormal glucose: Secondary | ICD-10-CM | POA: Diagnosis not present

## 2022-06-27 ENCOUNTER — Other Ambulatory Visit: Payer: Self-pay | Admitting: Dermatology

## 2022-06-27 DIAGNOSIS — R21 Rash and other nonspecific skin eruption: Secondary | ICD-10-CM

## 2022-06-30 ENCOUNTER — Ambulatory Visit (INDEPENDENT_AMBULATORY_CARE_PROVIDER_SITE_OTHER): Payer: BC Managed Care – PPO | Admitting: Physician Assistant

## 2022-06-30 ENCOUNTER — Encounter: Payer: Self-pay | Admitting: Physician Assistant

## 2022-06-30 VITALS — BP 146/68 | HR 87 | Temp 98.2°F

## 2022-06-30 DIAGNOSIS — J329 Chronic sinusitis, unspecified: Secondary | ICD-10-CM

## 2022-06-30 DIAGNOSIS — H9202 Otalgia, left ear: Secondary | ICD-10-CM

## 2022-06-30 MED ORDER — AMOXICILLIN 500 MG PO CAPS: 500.0000 mg | ORAL_CAPSULE | Freq: Three times a day (TID) | ORAL | 0 refills | Status: AC

## 2022-06-30 NOTE — Progress Notes (Signed)
[facility] [address], [place] [address]   Office Visit Note  Patient Name: Bridget Reed Date of Birth [id number]  Medical Record number [id number]  Date of Service: 06/30/2022  Chief Complaint  Patient presents with   Ear Fullness    Ear still not better.      62 y/o F presents to the clinic for a f/u for left ear pain/discomfort. She was seen last week for the same and recommended to take orla steroids, Afrin, and Montelukast. She admits to some improvement, but continues to feel congested left ear, now progressed to pain. Mild sore throat. No recent dental procedures.   Ear Fullness  Associated symptoms include coughing (occasional) and a sore throat. Pertinent negatives include no ear discharge.      Current Medication:  Outpatient Encounter Medications as of 06/30/2022  Medication Sig   amLODipine (NORVASC) 2.5 MG tablet TAKE 1 TABLET (2.5 MG TOTAL) BY MOUTH DAILY. FOR BLOOD PRESSURE >130/>80   amoxicillin (AMOXIL) 500 MG capsule Take 1 capsule (500 mg total) by mouth 3 (three) times daily for 7 days.   azelastine (ASTELIN) 0.1 % nasal spray Place 1-2 sprays into both nostrils twice daily.  Aim upward and outward.   fexofenadine (ALLEGRA) 60 MG tablet Take 60 mg by mouth 2 (two) times daily.   lisinopril-hydrochlorothiazide (ZESTORETIC) 20-12.5 MG tablet Take 1 tablet by mouth daily. In am if BP >130/>80 02/27/20 take 2 pills in the am each day   montelukast (SINGULAIR) 10 MG tablet Take 1 tablet (10 mg total) by mouth at bedtime.   omeprazole (PRILOSEC) 40 MG capsule Take 1 capsule (40 mg total) by mouth daily.   clobetasol cream (TEMOVATE) 0.05 % Apply twice daily for 2 weeks. Avoid applying to face, groin, and axilla.   fluconazole (DIFLUCAN) 150 MG tablet Take one tablet by mouth today and may repeat in 72 hours if symptoms persist. (Patient not taking: Reported on 06/30/2022)   hydrOXYzine (ATARAX) 25 MG tablet Take 1 tablet (25 mg total) by mouth at  bedtime as needed.   levocetirizine (XYZAL) 5 MG tablet TAKE 1 TABLET (5 MG TOTAL) BY MOUTH DAILY. (Patient not taking: Reported on 06/30/2022)   Levothyroxine Sodium (TIROSINT-SOL) 88 MCG/ML SOLN Take 88 mcg by mouth daily. (Patient not taking: Reported on 06/30/2022)   loratadine (CLARITIN) 10 MG tablet Take 10 mg by mouth daily. (Patient not taking: Reported on 06/30/2022)   methylPREDNISolone (MEDROL DOSEPAK) 4 MG TBPK tablet Take as directed (Patient not taking: Reported on 06/30/2022)   pimecrolimus (ELIDEL) 1 % cream Apply twice daily to affected areas   tacrolimus (PROTOPIC) 0.1 % ointment Apply topically 2 (two) times daily.   No facility-administered encounter medications on file as of 06/30/2022.      Medical History: Past Medical History:  Diagnosis Date   Allergy    Basal cell carcinoma 06/17/2017   right chest    GERD (gastroesophageal reflux disease)    Hypertension    Left tennis elbow    Morbid obesity ([place]) 07/13/2016     Vital Signs: BP (!) 146/68 (BP Location: Left Arm, Patient Position: Sitting, Cuff Size: Large)   Pulse 87   Temp 98.2 F (36.8 C) (Tympanic)   LMP 11/22/2013 (Approximate)   SpO2 98%    Review of Systems  Constitutional: Negative.   HENT:  Positive for congestion, ear pain, postnasal drip and sore throat. Negative for ear discharge, sinus pressure, sinus pain and trouble swallowing.  Respiratory:  Positive for cough (occasional). Negative for chest tightness, shortness of breath and wheezing.   Cardiovascular: Negative.   Neurological: Negative.     Physical Exam Constitutional:      Appearance: Normal appearance.  HENT:     Head: Atraumatic.     Right Ear: Tympanic membrane, ear canal and external ear normal. Tympanic membrane is not injected, perforated, erythematous, retracted or bulging.     Left Ear: Ear canal and external ear normal. Tympanic membrane is not perforated, erythematous or bulging.     Ears:     Comments: L TM appears  dull without erythema or bulging    Nose: Nose normal.     Mouth/Throat:     Mouth: Mucous membranes are moist.     Pharynx: Oropharynx is clear.  Eyes:     Extraocular Movements: Extraocular movements intact.  Cardiovascular:     Rate and Rhythm: Normal rate and regular rhythm.  Pulmonary:     Effort: Pulmonary effort is normal.     Breath sounds: Normal breath sounds.  Musculoskeletal:     Cervical back: Neck supple.  Skin:    General: Skin is warm.  Neurological:     Mental Status: She is alert.  Psychiatric:        Mood and Affect: Mood normal.        Behavior: Behavior normal.        Thought Content: Thought content normal.        Judgment: Judgment normal.       Assessment/Plan:  1. Acute otalgia, left  2. Rhinosinusitis - amoxicillin (AMOXIL) 500 MG capsule; Take 1 capsule (500 mg total) by mouth 3 (three) times daily for 7 days.  Dispense: 21 capsule; Refill: 0  Reviewed my clinical findings with patient. Discontinue Afrin nasal decongestant and re-start oral decongestant, Sudafed, as directed on the box.  Pt will monitor BP closely and will discontinue Sudafed if BP abnormally high. She verbalized understanding. Start oral antibiotic, Amoxicillin, as prescribed. Increase fluids Continue with humidifier. Continue to watch for worsening symptoms. RTC in 1 week or sooner if any difficulty arises. Pt verbalized understanding and in agreement.   General Counseling: matraca hunkins understanding of the findings of todays visit and agrees with plan of treatment. I have discussed any further diagnostic evaluation that may be needed or ordered today. We also reviewed her medications today. she has been encouraged to call the office with any questions or concerns that should arise related to todays visit.    Time spent:20 [place], [place] Physician Assistant

## 2022-07-07 DIAGNOSIS — H6982 Other specified disorders of Eustachian tube, left ear: Secondary | ICD-10-CM | POA: Diagnosis not present

## 2022-07-24 DIAGNOSIS — R7309 Other abnormal glucose: Secondary | ICD-10-CM | POA: Diagnosis not present

## 2022-07-27 ENCOUNTER — Encounter: Payer: Self-pay | Admitting: Family

## 2022-07-27 ENCOUNTER — Other Ambulatory Visit (INDEPENDENT_AMBULATORY_CARE_PROVIDER_SITE_OTHER): Payer: BC Managed Care – PPO

## 2022-07-27 DIAGNOSIS — E039 Hypothyroidism, unspecified: Secondary | ICD-10-CM | POA: Diagnosis not present

## 2022-07-27 LAB — TSH: TSH: 3.25 u[IU]/mL (ref 0.35–5.50)

## 2022-07-28 MED ORDER — TIROSINT-SOL 100 MCG/ML PO SOLN: 1.0000 mL | Freq: Every day | ORAL | 0 refills | Status: DC

## 2022-07-29 ENCOUNTER — Ambulatory Visit: Payer: BC Managed Care – PPO | Admitting: Dermatology

## 2022-07-29 VITALS — BP 162/77 | HR 90

## 2022-07-29 DIAGNOSIS — L2089 Other atopic dermatitis: Secondary | ICD-10-CM

## 2022-07-29 DIAGNOSIS — L820 Inflamed seborrheic keratosis: Secondary | ICD-10-CM | POA: Diagnosis not present

## 2022-07-29 DIAGNOSIS — L304 Erythema intertrigo: Secondary | ICD-10-CM | POA: Diagnosis not present

## 2022-07-29 DIAGNOSIS — L209 Atopic dermatitis, unspecified: Secondary | ICD-10-CM

## 2022-07-29 MED ORDER — DUPIXENT 300 MG/2ML ~~LOC~~ SOAJ: 300.0000 mg | SUBCUTANEOUS | 5 refills | Status: DC

## 2022-07-29 MED ORDER — DUPILUMAB 300 MG/2ML ~~LOC~~ SOSY
600.0000 mg | PREFILLED_SYRINGE | Freq: Once | SUBCUTANEOUS | Status: AC
  Administered 2022-07-29: 600 mg via SUBCUTANEOUS

## 2022-07-29 MED ORDER — KETOCONAZOLE 2 % EX CREA: TOPICAL_CREAM | CUTANEOUS | 5 refills | Status: DC

## 2022-07-29 NOTE — Progress Notes (Signed)
Follow-Up Visit   Subjective  Bridget Reed is a 62 y.o. female who presents for the following: Eczema.  Patient has continuous outbreaks on her trunk, arms, and legs. She is using clobetasol cream, tacrolimus ointment, and pimecrolimus ointment. She has also used TMC cream in the past. She has allergies and takes Claritin and Allegra. She also has hydroxyzine as needed at night. She has cut out yeast from her diet, and she feels like she hasn't broken out as much but rash never really clears up and she still itches.  She has couple spots that are itchy she would like checked.  The following portions of the chart were reviewed this encounter and updated as appropriate:       Review of Systems:  No other skin or systemic complaints except as noted in HPI or Assessment and Plan.  Objective  Well appearing patient in no apparent distress; mood and affect are within normal limits.  A focused examination was performed including face, trunk, extremities. Relevant physical exam findings are noted in the Assessment and Plan.  Left Flank at bra line x 1, L lower flank x 1 (2) Erythematous stuck-on, waxy papule or plaque  body folds Mild erythema lower abdomen/inguinal crease.  ankles, thighs, arms, chest, lower abdomen Pink patches with mild xerosis of the bilateral upper arms, thighs, ankles, chest, lower abdomen, BSA 10%    Assessment & Plan  Other atopic dermatitis  Related Medications dupilumab (DUPIXENT) prefilled syringe 600 mg   Inflamed seborrheic keratosis (2) Left Flank at bra line x 1, L lower flank x 1  Symptomatic, irritating, patient would like treated.  Destruction of lesion - Left Flank at bra line x 1, L lower flank x 1  Destruction method: cryotherapy   Informed consent: discussed and consent obtained   Lesion destroyed using liquid nitrogen: Yes   Region frozen until ice ball extended beyond lesion: Yes   Outcome: patient tolerated procedure well with  no complications   Post-procedure details: wound care instructions given   Additional details:  Prior to procedure, discussed risks of blister formation, small wound, skin dyspigmentation, or rare scar following cryotherapy. Recommend Vaseline ointment to treated areas while healing.   Erythema intertrigo body folds  Chronic and persistent condition with duration or expected duration over one year. Condition is symptomatic/ bothersome to patient. Not currently at goal.   Intertrigo is a chronic recurrent rash that occurs in skin fold areas that may be associated with friction; heat; moisture; yeast; fungus; and bacteria.  It is exacerbated by increased movement / activity; sweating; and higher atmospheric temperature.  Restart ketoconazole 2% cream Apply to body folds QD/BID dsp 60g 5Rf. Pimecrolimus cream qd/bid prn itch  ketoconazole (NIZORAL) 2 % cream - body folds Apply to body folds once to twice daily.  Atopic dermatitis, unspecified type ankles, thighs, arms, chest, lower abdomen  Chronic and persistent condition with duration or expected duration over one year. Condition is bothersome/symptomatic for patient. Currently flared.  Bx-proven.  Atopic dermatitis (eczema) is a chronic, relapsing, pruritic condition that can significantly affect quality of life. It is often associated with allergic rhinitis and/or asthma and can require treatment with topical medications, phototherapy, or in severe cases biologic injectable medication (Dupixent; Adbry) or Oral JAK inhibitors.  Patient has tried Clobetasol Cream, Tacrolimus Ointment, Pimecrolimus Cream, TMC Cream. Still complains of being itchy all over. She has allergies and takes Claritin and Allegra daily.   Discussed Dupixent injections. Dupilumab (Dupixent) is a treatment  given by injection for adults and children with moderate-to-severe atopic dermatitis. Goal is control of skin condition, not cure. It is given as 2 injections at  the first dose followed by 1 injection ever 2 weeks thereafter.  Young children are dosed monthly. Potential side effects include allergic reaction, herpes infections, injection site reactions and conjunctivitis (inflammation of the eyes).  The use of Dupixent requires long term medication management, including periodic office visits.  Loading dose given to patient in office today. Dupixent 300 MG/2 ML injected into patients left and right upper arm. Samples x 2 used. Lot No. [id number] Exp 08/2024 NPI [id number]  Dupixent 300 MG/2 ML inject sq q 2 weeks for maintenance 5Rf. Rx sent to [facility]. Dupixent MyWay forms filled out and faxed.    dupilumab (DUPIXENT) prefilled syringe 600 mg - ankles, thighs, arms, chest, lower abdomen   Dupilumab (DUPIXENT) 300 MG/2ML SOPN - ankles, thighs, arms, chest, lower abdomen Inject 300 mg into the skin every 14 (fourteen) days. Starting at day 15 for maintenance.  Related Medications pimecrolimus (ELIDEL) 1 % cream Apply twice daily to affected areas   Return in about 2 weeks (around 08/12/2022) for nurse visit for sample Dupixent injection, 4 wks Dr Chauncey Cruel..  I, Bridget Reed, CMA, am acting as scribe for Brendolyn Patty, MD .  Documentation: I have reviewed the above documentation for accuracy and completeness, and I agree with the above.  Brendolyn Patty MD

## 2022-07-29 NOTE — Patient Instructions (Addendum)
Cryotherapy Aftercare  Wash gently with soap and water everyday.   Apply Vaseline and Band-Aid daily until healed.   Dupilumab (Dupixent) is a treatment given by injection for adults and children with moderate-to-severe atopic dermatitis. Goal is control of skin condition, not cure. It is given as 2 injections at the first dose followed by 1 injection ever 2 weeks thereafter.  Young children are dosed monthly.  Potential side effects include allergic reaction, herpes infections, injection site reactions and conjunctivitis (inflammation of the eyes).  The use of Dupixent requires long term medication management, including periodic office visits.   Due to recent changes in healthcare laws, you may see results of your pathology and/or laboratory studies on MyChart before the doctors have had a chance to review them. We understand that in some cases there may be results that are confusing or concerning to you. Please understand that not all results are received at the same time and often the doctors may need to interpret multiple results in order to provide you with the best plan of care or course of treatment. Therefore, we ask that you please give [place] 2 business days to thoroughly review all your results before contacting the office for clarification. Should we see a critical lab result, you will be contacted sooner.   If You Need Anything After Your Visit  If you have any questions or concerns for your doctor, please call our main line at [phone] and press option 4 to reach your doctor's medical assistant. If no one answers, please leave a voicemail as directed and we will return your call as soon as possible. Messages left after 4 pm will be answered the following business day.   You may also send [place] a message via [place]. We typically respond to MyChart messages within 1-2 business days.  For prescription refills, please ask your pharmacy to contact our office. Our fax number is  [phone].  If you have an urgent issue when the clinic is closed that cannot wait until the next business day, you can page your doctor at the number below.    Please note that while we do our best to be available for urgent issues outside of office hours, we are not available 24/7.   If you have an urgent issue and are unable to reach [place], you may choose to seek medical care at your doctor's office, retail clinic, urgent care center, or emergency room.  If you have a medical emergency, please immediately call 911 or go to the emergency department.  Pager Numbers  - Dr. Nehemiah Massed: [phone]  - Dr. Laurence Ferrari: [phone]  - Dr. Nicole Kindred: [phone]  In the event of inclement weather, please call our main line at [phone] for an update on the status of any delays or closures.  Dermatology Medication Tips: Please keep the boxes that topical medications come in in order to help keep track of the instructions about where and how to use these. Pharmacies typically print the medication instructions only on the boxes and not directly on the medication tubes.   If your medication is too expensive, please contact our office at [phone] option 4 or send [place] a message through [place].   We are unable to tell what your co-pay for medications will be in advance as this is different depending on your insurance coverage. However, we may be able to find a substitute medication at lower cost or fill out paperwork to get insurance to cover a needed medication.   If a  prior authorization is required to get your medication covered by your insurance company, please allow [place] 1-2 business days to complete this process.  Drug prices often vary depending on where the prescription is filled and some pharmacies may offer cheaper prices.  The website www.goodrx.com contains coupons for medications through different pharmacies. The prices here do not account for what the cost may be with help from  insurance (it may be cheaper with your insurance), but the website can give you the price if you did not use any insurance.  - You can print the associated coupon and take it with your prescription to the pharmacy.  - You may also stop by our office during regular business hours and pick up a GoodRx coupon card.  - If you need your prescription sent electronically to a different pharmacy, notify our office through [facility] or by phone at [phone] option 4.     Si Usted Necesita Algo Despus de Su Visita  Tambin puede enviarnos un mensaje a travs de Pharmacist, community. Por lo general respondemos a los mensajes de MyChart en el transcurso de 1 a 2 das hbiles.  Para renovar recetas, por favor pida a su farmacia que se ponga en contacto con nuestra oficina. Harland Dingwall de fax es [place] [phone].  Si tiene un asunto urgente cuando la clnica est cerrada y que no puede esperar hasta el siguiente da hbil, puede llamar/localizar a su doctor(a) al nmero que aparece a continuacin.   Por favor, tenga en cuenta que aunque hacemos todo lo posible para estar disponibles para asuntos urgentes fuera del horario de [place], no estamos disponibles las 24 horas del da, los 7 das de la [place].   Si tiene un problema urgente y no puede comunicarse con nosotros, puede optar por buscar atencin mdica  en el consultorio de su doctor(a), en una clnica privada, en un centro de atencin urgente o en una sala de emergencias.  Si tiene Engineering geologist, por favor llame inmediatamente al 911 o vaya a la sala de emergencias.  Nmeros de bper  - Dr. Nehemiah Massed: [phone]  - Dra. Moye: [phone]  - Dra. Nicole Kindred: [phone]  En caso de inclemencias del [place], por favor llame a Johnsie Kindred principal al [phone] para una actualizacin sobre el [place] de cualquier retraso o cierre.  Consejos para la medicacin en dermatologa: Por favor, guarde las cajas en las que vienen los  medicamentos de uso tpico para ayudarle a seguir las instrucciones sobre dnde y cmo usarlos. Las farmacias generalmente imprimen las instrucciones del medicamento slo en las cajas y no directamente en los tubos del [place].   Si su medicamento es muy caro, por favor, pngase en contacto con Zigmund Daniel llamando al [phone] y presione la opcin 4 o envenos un mensaje a travs de Pharmacist, community.   No podemos decirle cul ser su copago por los medicamentos por adelantado ya que esto es diferente dependiendo de la cobertura de su seguro. Sin embargo, es posible que podamos encontrar un medicamento sustituto a Electrical engineer un formulario para que el seguro cubra el medicamento que se considera necesario.   Si se requiere una autorizacin previa para que su compaa de seguros [place] su medicamento, por favor permtanos de 1 a 2 das hbiles para completar este proceso.  Los precios de los medicamentos varan con frecuencia dependiendo del Environmental consultant de dnde se surte la receta y alguna farmacias pueden ofrecer precios ms baratos.  El sitio web www.goodrx.com tiene cupones  para medicamentos de Airline pilot. Los precios aqu no tienen en cuenta lo que podra costar con la ayuda del seguro (puede ser ms barato con su seguro), pero el sitio web puede darle el precio si no utiliz Research scientist (physical sciences).  - Puede imprimir el cupn correspondiente y llevarlo con su receta a la farmacia.  - Tambin puede pasar por nuestra oficina durante el horario de atencin regular y Charity fundraiser una tarjeta de cupones de GoodRx.  - Si necesita que su receta se enve electrnicamente a una farmacia diferente, informe a nuestra oficina a travs de MyChart de [place] o por telfono llamando al [phone] y presione la opcin 4.

## 2022-08-12 ENCOUNTER — Ambulatory Visit (INDEPENDENT_AMBULATORY_CARE_PROVIDER_SITE_OTHER): Payer: BC Managed Care – PPO

## 2022-08-12 DIAGNOSIS — L209 Atopic dermatitis, unspecified: Secondary | ICD-10-CM | POA: Diagnosis not present

## 2022-08-12 MED ORDER — DUPILUMAB 300 MG/2ML ~~LOC~~ SOSY
300.0000 mg | PREFILLED_SYRINGE | Freq: Once | SUBCUTANEOUS | Status: AC
  Administered 2022-08-12: 300 mg via SUBCUTANEOUS

## 2022-08-12 NOTE — Progress Notes (Signed)
Patient here today for 2 week Dupixent injection for Severe Atopic Dermatitis.   Dupixent 300mg /42ml injected into patients left upper arm today. Patient tolerated procedure well.   PO:[id number] Exp: 08/2024 [id number]  Chastity Noland RMA

## 2022-08-24 DIAGNOSIS — R7309 Other abnormal glucose: Secondary | ICD-10-CM | POA: Diagnosis not present

## 2022-08-27 ENCOUNTER — Other Ambulatory Visit: Payer: Self-pay

## 2022-08-27 DIAGNOSIS — L209 Atopic dermatitis, unspecified: Secondary | ICD-10-CM

## 2022-08-27 MED ORDER — DUPIXENT 300 MG/2ML ~~LOC~~ SOAJ: 300.0000 mg | SUBCUTANEOUS | 5 refills | Status: DC

## 2022-08-27 NOTE — Progress Notes (Signed)
[facility] faxed over pharmacy change request from them to [facility]. Escripted to [facility]

## 2022-09-02 ENCOUNTER — Ambulatory Visit: Payer: BC Managed Care – PPO | Admitting: Dermatology

## 2022-09-02 VITALS — BP 174/85 | HR 94

## 2022-09-02 DIAGNOSIS — L820 Inflamed seborrheic keratosis: Secondary | ICD-10-CM

## 2022-09-02 DIAGNOSIS — Z79899 Other long term (current) drug therapy: Secondary | ICD-10-CM

## 2022-09-02 DIAGNOSIS — L2081 Atopic neurodermatitis: Secondary | ICD-10-CM | POA: Diagnosis not present

## 2022-09-02 NOTE — Patient Instructions (Addendum)
Cryotherapy Aftercare  Wash gently with soap and water everyday.   Apply Vaseline and Band-Aid daily until healed.   Dupilumab (Dupixent) is a treatment given by injection for adults and children with moderate-to-severe atopic dermatitis. Goal is control of skin condition, not cure. It is given as 2 injections at the first dose followed by 1 injection ever 2 weeks thereafter.  Young children are dosed monthly.  Potential side effects include allergic reaction, herpes infections, injection site reactions and conjunctivitis (inflammation of the eyes).  The use of Dupixent requires long term medication management, including periodic office visits.   Due to recent changes in healthcare laws, you may see results of your pathology and/or laboratory studies on MyChart before the doctors have had a chance to review them. We understand that in some cases there may be results that are confusing or concerning to you. Please understand that not all results are received at the same time and often the doctors may need to interpret multiple results in order to provide you with the best plan of care or course of treatment. Therefore, we ask that you please give us 2 business days to thoroughly review all your results before contacting the office for clarification. Should we see a critical lab result, you will be contacted sooner.   If You Need Anything After Your Visit  If you have any questions or concerns for your doctor, please call our main line at [phone] and press option 4 to reach your doctor's medical assistant. If no one answers, please leave a voicemail as directed and we will return your call as soon as possible. Messages left after 4 pm will be answered the following business day.   You may also send us a message via MyChart. We typically respond to MyChart messages within 1-2 business days.  For prescription refills, please ask your pharmacy to contact our office. Our fax number is  [phone].  If you have an urgent issue when the clinic is closed that cannot wait until the next business day, you can page your doctor at the number below.    Please note that while we do our best to be available for urgent issues outside of office hours, we are not available 24/7.   If you have an urgent issue and are unable to reach us, you may choose to seek medical care at your doctor's office, retail clinic, urgent care center, or emergency room.  If you have a medical emergency, please immediately call 911 or go to the emergency department.  Pager Numbers  - Dr. Kowalski: [phone]  - Dr. Moye: [phone]  - Dr. Stewart: [phone]  In the event of inclement weather, please call our main line at [phone] for an update on the status of any delays or closures.  Dermatology Medication Tips: Please keep the boxes that topical medications come in in order to help keep track of the instructions about where and how to use these. Pharmacies typically print the medication instructions only on the boxes and not directly on the medication tubes.   If your medication is too expensive, please contact our office at [phone] option 4 or send us a message through MyChart.   We are unable to tell what your co-pay for medications will be in advance as this is different depending on your insurance coverage. However, we may be able to find a substitute medication at lower cost or fill out paperwork to get insurance to cover a needed medication.   If a   prior authorization is required to get your medication covered by your insurance company, please allow us 1-2 business days to complete this process.  Drug prices often vary depending on where the prescription is filled and some pharmacies may offer cheaper prices.  The website www.goodrx.com contains coupons for medications through different pharmacies. The prices here do not account for what the cost may be with help from  insurance (it may be cheaper with your insurance), but the website can give you the price if you did not use any insurance.  - You can print the associated coupon and take it with your prescription to the pharmacy.  - You may also stop by our office during regular business hours and pick up a GoodRx coupon card.  - If you need your prescription sent electronically to a different pharmacy, notify our office through [facility] MyChart or by phone at [phone] option 4.     Si Usted Necesita Algo Despus de Su Visita  Tambin puede enviarnos un mensaje a travs de MyChart. Por lo general respondemos a los mensajes de MyChart en el transcurso de 1 a 2 das hbiles.  Para renovar recetas, por favor pida a su farmacia que se ponga en contacto con nuestra oficina. Nuestro nmero de fax es el [phone].  Si tiene un asunto urgente cuando la clnica est cerrada y que no puede esperar hasta el siguiente da hbil, puede llamar/localizar a su doctor(a) al nmero que aparece a continuacin.   Por favor, tenga en cuenta que aunque hacemos todo lo posible para estar disponibles para asuntos urgentes fuera del horario de oficina, no estamos disponibles las 24 horas del da, los 7 das de la semana.   Si tiene un problema urgente y no puede comunicarse con nosotros, puede optar por buscar atencin mdica  en el consultorio de su doctor(a), en una clnica privada, en un centro de atencin urgente o en una sala de emergencias.  Si tiene una emergencia mdica, por favor llame inmediatamente al 911 o vaya a la sala de emergencias.  Nmeros de bper  - Dr. Kowalski: [phone]  - Dra. Moye: [phone]  - Dra. Stewart: [phone]  En caso de inclemencias del tiempo, por favor llame a nuestra lnea principal al [phone] para una actualizacin sobre el estado de cualquier retraso o cierre.  Consejos para la medicacin en dermatologa: Por favor, guarde las cajas en las que vienen los  medicamentos de uso tpico para ayudarle a seguir las instrucciones sobre dnde y cmo usarlos. Las farmacias generalmente imprimen las instrucciones del medicamento slo en las cajas y no directamente en los tubos del medicamento.   Si su medicamento es muy caro, por favor, pngase en contacto con nuestra oficina llamando al [phone] y presione la opcin 4 o envenos un mensaje a travs de MyChart.   No podemos decirle cul ser su copago por los medicamentos por adelantado ya que esto es diferente dependiendo de la cobertura de su seguro. Sin embargo, es posible que podamos encontrar un medicamento sustituto a menor costo o llenar un formulario para que el seguro cubra el medicamento que se considera necesario.   Si se requiere una autorizacin previa para que su compaa de seguros cubra su medicamento, por favor permtanos de 1 a 2 das hbiles para completar este proceso.  Los precios de los medicamentos varan con frecuencia dependiendo del lugar de dnde se surte la receta y alguna farmacias pueden ofrecer precios ms baratos.  El sitio web www.goodrx.com tiene cupones   para medicamentos de diferentes farmacias. Los precios aqu no tienen en cuenta lo que podra costar con la ayuda del seguro (puede ser ms barato con su seguro), pero el sitio web puede darle el precio si no utiliz ningn seguro.  - Puede imprimir el cupn correspondiente y llevarlo con su receta a la farmacia.  - Tambin puede pasar por nuestra oficina durante el horario de atencin regular y recoger una tarjeta de cupones de GoodRx.  - Si necesita que su receta se enve electrnicamente a una farmacia diferente, informe a nuestra oficina a travs de MyChart de [place] o por telfono llamando al [phone] y presione la opcin 4.  

## 2022-09-02 NOTE — Progress Notes (Signed)
   Follow-Up Visit   Subjective  Bridget Reed is a 62 y.o. female who presents for the following: Atopic Dermatitis of the arms, legs, trunk. She is on Dupixent injections. She has had 3 doses so far and is already seeing improvement, not as itchy. No side effects from injections. Eyes normal. She has clobetasol cream, TMC Cream, and pimecrolimus cream to use topically, but not having to use as often.  Patient also has a few spots on her neck, chest to check today that are irritating.     The following portions of the chart were reviewed this encounter and updated as appropriate: medications, allergies, medical history  Review of Systems:  No other skin or systemic complaints except as noted in HPI or Assessment and Plan.  Objective  Well appearing patient in no apparent distress; mood and affect are within normal limits.  Areas Examined: Face, arms, legs  Relevant physical exam findings are noted in the Assessment and Plan.    Assessment & Plan      ATOPIC DERMATITIS Exam: Scattered light pink papules on the legs and arms.  Chronic and persistent condition with duration or expected duration over one year. Condition is symptomatic/ bothersome to patient. Not currently at goal, but improving on Dupixent.   Atopic dermatitis (eczema) is a chronic, relapsing, pruritic condition that can significantly affect quality of life. It is often associated with allergic rhinitis and/or asthma and can require treatment with topical medications, phototherapy, or in severe cases biologic injectable medication (Dupixent; Adbry) or Oral JAK inhibitors.  Treatment Plan:  Continue Dupixent 300 mg/42mL SQ QOW. Patient denies side effects. Continue Clobetasol cream QD/BID more severe flares prn.  Continue Pimecrolimus Cream QD/BID AAs prn.     Potential side effects of Dupixent include allergic reaction, herpes infections, injection site reactions and conjunctivitis (inflammation of the  eyes).  The use of Dupixent requires long term medication management, including periodic office visits.  Recommend gentle skin care.  INFLAMED SEBORRHEIC KERATOSIS Exam: Erythematous keratotic or waxy stuck-on papule  Symptomatic, irritating, patient would like treated.  Benign-appearing.  Call clinic for new or changing lesions.   Prior to procedure, discussed risks of blister formation, small wound, skin dyspigmentation, or rare scar following treatment. Recommend Vaseline ointment to treated areas while healing.  Destruction Procedure Note Destruction method: cryotherapy   Informed consent: discussed and consent obtained   Lesion destroyed using liquid nitrogen: Yes   Outcome: patient tolerated procedure well with no complications   Post-procedure details: wound care instructions given   Locations: right chest x 2, right neck x 1 # of Lesions Treated: 3  Return in about 6 months (around 03/04/2023) for Atopic Dermatitis.  ICherlyn Labella, CMA, am acting as scribe for Willeen Niece, MD .   Documentation: I have reviewed the above documentation for accuracy and completeness, and I agree with the above.  Willeen Niece, MD

## 2022-09-23 ENCOUNTER — Encounter: Payer: Self-pay | Admitting: Family

## 2022-09-23 ENCOUNTER — Ambulatory Visit: Payer: BC Managed Care – PPO | Admitting: Family

## 2022-09-23 VITALS — BP 158/60 | HR 100 | Temp 98.3°F | Ht 61.0 in | Wt 260.8 lb

## 2022-09-23 DIAGNOSIS — E039 Hypothyroidism, unspecified: Secondary | ICD-10-CM

## 2022-09-23 DIAGNOSIS — R600 Localized edema: Secondary | ICD-10-CM

## 2022-09-23 DIAGNOSIS — R7989 Other specified abnormal findings of blood chemistry: Secondary | ICD-10-CM | POA: Diagnosis not present

## 2022-09-23 DIAGNOSIS — I10 Essential (primary) hypertension: Secondary | ICD-10-CM | POA: Diagnosis not present

## 2022-09-23 LAB — BASIC METABOLIC PANEL
BUN: 15 mg/dL (ref 6–23)
CO2: 29 mEq/L (ref 19–32)
Calcium: 9.6 mg/dL (ref 8.4–10.5)
Chloride: 94 mEq/L — ABNORMAL LOW (ref 96–112)
Creatinine, Ser: 0.59 mg/dL (ref 0.40–1.20)
GFR: 97.76 mL/min (ref >60.00)
Glucose, Bld: 121 mg/dL — ABNORMAL HIGH (ref 70–99)
Potassium: 3.8 mEq/L (ref 3.5–5.1)
Sodium: 133 mEq/L — ABNORMAL LOW (ref 135–145)

## 2022-09-23 LAB — TSH: TSH: 1.85 u[IU]/mL (ref 0.35–5.50)

## 2022-09-23 MED ORDER — HYDROCHLOROTHIAZIDE 25 MG PO TABS
25.0000 mg | ORAL_TABLET | Freq: Every day | ORAL | 3 refills | Status: DC
Start: 2022-09-23 — End: 2023-07-06

## 2022-09-23 MED ORDER — VALSARTAN 160 MG PO TABS: 160.0000 mg | ORAL_TABLET | Freq: Every day | ORAL | 3 refills | Status: DC

## 2022-09-23 MED ORDER — FUROSEMIDE 20 MG PO TABS
ORAL_TABLET | ORAL | 0 refills | Status: DC
Start: 2022-09-23 — End: 2022-11-06

## 2022-09-23 NOTE — Patient Instructions (Signed)
  Stop amlodipine completely.  Start valsartan 160 mg once daily  Start hctz 25 mg once daily.  Stop lisinopril hctz combination pill.   Take furosemide 20 mg once daily or as needed (stop once edema resolves)  Wear compression stockings at night and make sure to elevate feet nightly.  ------------------------------------  Start monitoring your blood pressure daily, around the same time of day, for the next 2-3 weeks.  Ensure that you have rested for 30 minutes prior to checking your blood pressure. Record your readings and bring them to your next visit.  ------------------------------------   Regards,   Mort Sawyers FNP-C

## 2022-09-23 NOTE — Assessment & Plan Note (Signed)
Stop lisinopril hctz  Start hctz 25 mg once daily as well as valsartan 160 mg once daily.  Pt advised of the following:  Continue medication as prescribed. Monitor blood pressure periodically and/or when you feel symptomatic. Goal is <130/90 on average. Ensure that you have rested for 30 minutes prior to checking your blood pressure. Record your readings and bring them to your next visit if necessary.work on a low sodium diet.

## 2022-09-23 NOTE — Progress Notes (Signed)
Established Patient Office Visit  Subjective:   Patient ID: Bridget Reed, female    DOB: 06-11-1960  Age: 62 y.o. MRN: [id number]  CC:  Chief Complaint  Patient presents with   Foot Swelling    With ankle and hands swelling for a couple of weeks. No headaches, numbness, or chest discomfort.    HPI: Bridget Reed is a 62 y.o. female presenting on 09/23/2022 for Foot Swelling (With ankle and hands swelling for a couple of weeks. No headaches, numbness, or chest discomfort.)  HPI  Has noticed ankle and hand swelling over the last two weeks.  Started noticing increased swelling after walking all day at a conference.  Was only coming and going, but now slightly more constantly. Worse during midday. Has not noticed any worsening sob does have doe however this has been chronic when she walks long distances. Doesn't eat salt in her diet very much but does eat out a lot.   No pain in hands and or feet. The swelling in the hands are in her fingers as her rings are usually loose and now harder to put on and off.   Wt Readings from Last 3 Encounters:  09/23/22 260 lb 12.8 oz (118.3 kg)  06/15/22 258 lb (117 kg)  04/28/22 259 lb 2 oz (117.5 kg)    Still taking lisionpril hctz , hctz 12.5 mg  Did have some leftover furosemide 20 mg yesterday without relief, however they are expired from 2022.   Hypothyroid: due for repeat tsh.     ROS: Negative unless specifically indicated above in HPI.   Relevant past medical history reviewed and updated as indicated.   Allergies and medications reviewed and updated.   Current Outpatient Medications:    clobetasol cream (TEMOVATE) 0.05 %, Apply twice daily for 2 weeks. Avoid applying to face, groin, and axilla., Disp: 60 g, Rfl: 2   Dupilumab (DUPIXENT) 300 MG/2ML SOPN, Inject 300 mg into the skin every 14 (fourteen) days. Starting at day 15 for maintenance., Disp: 4 mL, Rfl: 5   fexofenadine (ALLEGRA) 60 MG tablet, Take 60 mg by mouth 2  (two) times daily., Disp: , Rfl:    furosemide (LASIX) 20 MG tablet, Take one po qd prn pedal edema, Disp: 30 tablet, Rfl: 0   hydrochlorothiazide (HYDRODIURIL) 25 MG tablet, Take 1 tablet (25 mg total) by mouth daily., Disp: 90 tablet, Rfl: 3   hydrOXYzine (ATARAX) 25 MG tablet, Take 1 tablet (25 mg total) by mouth at bedtime as needed., Disp: 30 tablet, Rfl: 5   ketoconazole (NIZORAL) 2 % cream, Apply to body folds once to twice daily., Disp: 60 g, Rfl: 5   Levothyroxine Sodium (TIROSINT-SOL) 100 MCG/ML SOLN, Take 1 mL by mouth daily., Disp: 90 mL, Rfl: 0   loratadine (CLARITIN) 10 MG tablet, Take 10 mg by mouth daily., Disp: , Rfl:    omeprazole (PRILOSEC) 40 MG capsule, Take 1 capsule (40 mg total) by mouth daily., Disp: 90 capsule, Rfl: 1   pimecrolimus (ELIDEL) 1 % cream, Apply twice daily to affected areas, Disp: 100 g, Rfl: 2   tacrolimus (PROTOPIC) 0.1 % ointment, Apply topically 2 (two) times daily., Disp: 100 g, Rfl: 2   valsartan (DIOVAN) 160 MG tablet, Take 1 tablet (160 mg total) by mouth daily., Disp: 90 tablet, Rfl: 3  Allergies  Allergen Reactions   Mobic [Meloxicam] Swelling    In hands and ankles and under eyes.   Polytrim [Polymyxin B-Trimethoprim] Itching  Itching of eyes after use. Patient allergic to sulfa   Azithromycin Rash   Erythromycin     Rash, GI upset   Influenza Vac Split Quad Hives    Possible allergy- rash after injection Still takes yearly flu vaccine   Shrimp [Shellfish Allergy] Other (See Comments)    Headache when eaten in excess   Sulfa Antibiotics Other (See Comments)    Unknown, can not remember   Synthroid [Levothyroxine] Hives    Brand and generic     Objective:   BP (!) 158/60   Pulse 100   Temp 98.3 F (36.8 C) (Temporal)   Ht 5\' 1"  (1.549 m)   Wt 260 lb 12.8 oz (118.3 kg)   LMP 11/22/2013 (Approximate)   SpO2 98%   BMI 49.28 kg/m    Physical Exam Constitutional:      General: She is not in acute distress.    Appearance:  Normal appearance. She is normal weight. She is not ill-appearing, toxic-appearing or diaphoretic.  HENT:     Head: Normocephalic.  Cardiovascular:     Rate and Rhythm: Normal rate and regular rhythm.  Pulmonary:     Effort: Pulmonary effort is normal.  Musculoskeletal:        General: Normal range of motion.     Right lower leg: 2+ Pitting Edema present.     Left lower leg: 2+ Pitting Edema present.  Neurological:     General: No focal deficit present.     Mental Status: She is alert and oriented to person, place, and time. Mental status is at baseline.  Psychiatric:        Mood and Affect: Mood normal.        Behavior: Behavior normal.        Thought Content: Thought content normal.        Judgment: Judgment normal.     Assessment & Plan:  Essential hypertension Assessment & Plan: Stop lisinopril hctz  Start hctz 25 mg once daily as well as valsartan 160 mg once daily.  Pt advised of the following:  Continue medication as prescribed. Monitor blood pressure periodically and/or when you feel symptomatic. Goal is <130/90 on average. Ensure that you have rested for 30 minutes prior to checking your blood pressure. Record your readings and bring them to your next visit if necessary.work on a low sodium diet.   Orders: -     Valsartan; Take 1 tablet (160 mg total) by mouth daily.  Dispense: 90 tablet; Refill: 3 -     hydroCHLOROthiazide; Take 1 tablet (25 mg total) by mouth daily.  Dispense: 90 tablet; Refill: 3  Pedal edema Assessment & Plan: Stop amlodipine as suspected cause.  Recommendation for elevation of legs nightly, compression stockings, limit salt intake and fast food.  Furosemide 20 mg prn edema Low suspicion for chf  Will assess kidney function, pending results with bmp   Orders: -     hydroCHLOROthiazide; Take 1 tablet (25 mg total) by mouth daily.  Dispense: 90 tablet; Refill: 3 -     Basic metabolic panel -     Furosemide; Take one po qd prn pedal edema   Dispense: 30 tablet; Refill: 0  Acquired hypothyroidism -     TSH  Elevated TSH Assessment & Plan: Repeat tsh pending results. Continue levothyroxine.      Follow up plan: Return if symptoms worsen or fail to improve.  Mort Sawyers, FNP

## 2022-09-23 NOTE — Assessment & Plan Note (Signed)
Stop amlodipine as suspected cause.  Recommendation for elevation of legs nightly, compression stockings, limit salt intake and fast food.  Furosemide 20 mg prn edema Low suspicion for chf  Will assess kidney function, pending results with bmp

## 2022-09-23 NOTE — Assessment & Plan Note (Signed)
Repeat tsh pending results. Continue levothyroxine.

## 2022-10-05 ENCOUNTER — Ambulatory Visit (INDEPENDENT_AMBULATORY_CARE_PROVIDER_SITE_OTHER): Payer: BC Managed Care – PPO | Admitting: Physician Assistant

## 2022-10-05 VITALS — BP 142/76 | HR 86 | Temp 96.6°F | Resp 16 | Wt 259.0 lb

## 2022-10-05 DIAGNOSIS — H1032 Unspecified acute conjunctivitis, left eye: Secondary | ICD-10-CM

## 2022-10-05 MED ORDER — GENTAMICIN SULFATE 0.3 % OP SOLN
1.0000 [drp] | Freq: Four times a day (QID) | OPHTHALMIC | 0 refills | Status: AC
Start: 2022-10-05 — End: 2022-10-10

## 2022-10-05 NOTE — Progress Notes (Signed)
[facility] [address] [place], [place] [address]   Office Visit Note  Patient Name: Bridget Reed Date of Birth [id number]  Medical Record number [id number]  Date of Service: 10/05/2022  Chief Complaint  Patient presents with   Eye Problem     62 y/o F presents to the clinic for c/o left eye with tearing, and discharge x 2 week. + redness of the eye. No double vision, photosensitivity, or headaches. No known exposure to pink eye. Denies eye trauma. She denies cold symptoms. She has been using anti-histamine eye drops x 2 weeks without any improvement. Currently takes Zyrtec as well.    Eye Problem  Associated symptoms include an eye discharge and eye redness. Pertinent negatives include no itching or photophobia.      Current Medication:  Outpatient Encounter Medications as of 10/05/2022  Medication Sig   gentamicin (GARAMYCIN) 0.3 % ophthalmic solution Place 1 drop into the left eye 4 (four) times daily for 5 days.   clobetasol cream (TEMOVATE) 0.05 % Apply twice daily for 2 weeks. Avoid applying to face, groin, and axilla.   Dupilumab (DUPIXENT) 300 MG/2ML SOPN Inject 300 mg into the skin every 14 (fourteen) days. Starting at day 15 for maintenance.   fexofenadine (ALLEGRA) 60 MG tablet Take 60 mg by mouth 2 (two) times daily.   furosemide (LASIX) 20 MG tablet Take one po qd prn pedal edema   hydrochlorothiazide (HYDRODIURIL) 25 MG tablet Take 1 tablet (25 mg total) by mouth daily.   hydrOXYzine (ATARAX) 25 MG tablet Take 1 tablet (25 mg total) by mouth at bedtime as needed.   ketoconazole (NIZORAL) 2 % cream Apply to body folds once to twice daily.   Levothyroxine Sodium (TIROSINT-SOL) 100 MCG/ML SOLN Take 1 mL by mouth daily.   loratadine (CLARITIN) 10 MG tablet Take 10 mg by mouth daily.   omeprazole (PRILOSEC) 40 MG capsule Take 1 capsule (40 mg total) by mouth daily.   pimecrolimus (ELIDEL) 1 % cream Apply twice daily to affected areas   tacrolimus  (PROTOPIC) 0.1 % ointment Apply topically 2 (two) times daily.   valsartan (DIOVAN) 160 MG tablet Take 1 tablet (160 mg total) by mouth daily.   No facility-administered encounter medications on file as of 10/05/2022.      Medical History: Past Medical History:  Diagnosis Date   Allergy    Basal cell carcinoma 06/17/2017   right chest    GERD (gastroesophageal reflux disease)    Hypertension    Left tennis elbow    Morbid obesity (HCC) 07/13/2016     Vital Signs: BP (!) 142/76 (BP Location: Left Arm, Patient Position: Sitting, Cuff Size: Large)   Pulse 86   Temp (!) 96.6 F (35.9 C) (Tympanic)   Resp 16   Wt 259 lb (117.5 kg)   LMP 11/22/2013 (Approximate)   SpO2 98%   BMI 48.94 kg/m    Review of Systems  Constitutional: Negative.   HENT: Negative.    Eyes:  Positive for discharge and redness. Negative for photophobia, pain, itching and visual disturbance.  Neurological: Negative.     Physical Exam Constitutional:      Appearance: Normal appearance.  HENT:     Head: Normocephalic and atraumatic.     Right Ear: Ear canal and external ear normal.     Left Ear: Ear canal and external ear normal.     Nose: Nose normal.     Mouth/Throat:     Mouth:  Mucous membranes are moist.     Pharynx: Oropharynx is clear.  Eyes:     General:        Right eye: No discharge or hordeolum.        Left eye: No discharge or hordeolum.     Extraocular Movements: Extraocular movements intact.     Right eye: Normal extraocular motion.     Left eye: Normal extraocular motion.     Conjunctiva/sclera:     Left eye: Left conjunctiva is injected.     Pupils: Pupils are equal, round, and reactive to light.  Skin:    General: Skin is warm and dry.  Neurological:     Mental Status: She is alert and oriented to person, place, and time.  Psychiatric:        Mood and Affect: Mood normal.        Behavior: Behavior normal.       Assessment/Plan:  1. Acute conjunctivitis of left eye,  unspecified acute conjunctivitis type - gentamicin (GARAMYCIN) 0.3 % ophthalmic solution; Place 1 drop into the left eye 4 (four) times daily for 5 days.  Dispense: 5 mL; Refill: 0  Apply cool compresses to the affected eye Take otc Benadryl at bedtime Continue with Zyrtec Start new eyedrops as prescribed. Continue to watch for worsening symptoms. Pt verbalized understanding and in agreement.  RTC prn  General Counseling: persephonie worman understanding of the findings of todays visit and agrees with plan of treatment. I have discussed any further diagnostic evaluation that may be needed or ordered today. We also reviewed her medications today. she has been encouraged to call the office with any questions or concerns that should arise related to todays visit.    Time spent:20 Minutes    Gilberto Better, [place] Physician Assistant

## 2022-10-07 ENCOUNTER — Ambulatory Visit (INDEPENDENT_AMBULATORY_CARE_PROVIDER_SITE_OTHER): Payer: Self-pay | Admitting: Adult Health

## 2022-10-07 VITALS — HR 97 | Temp 98.1°F

## 2022-10-07 DIAGNOSIS — M25572 Pain in left ankle and joints of left foot: Secondary | ICD-10-CM

## 2022-10-07 NOTE — Progress Notes (Signed)
[facility] [address] [place], [place] [address]   Office Visit Note  Patient Name: Bridget Reed Date of Birth [id number]  Medical Record number [id number]  Date of Service: 10/07/2022  Chief Complaint  Patient presents with   Ankle Pain     Ankle Pain    Pt is here for a sick visit. She reports while going down her garage steps (taking trash out) she stumbled, missed two steps and fell into a parked car in the garage. In the process the left ankle must have been injured, because it is hurting when she walks, unless she keeps the foot flat.    Current Medication:  Outpatient Encounter Medications as of 10/07/2022  Medication Sig   clobetasol cream (TEMOVATE) 0.05 % Apply twice daily for 2 weeks. Avoid applying to face, groin, and axilla.   Dupilumab (DUPIXENT) 300 MG/2ML SOPN Inject 300 mg into the skin every 14 (fourteen) days. Starting at day 15 for maintenance.   fexofenadine (ALLEGRA) 60 MG tablet Take 60 mg by mouth 2 (two) times daily.   furosemide (LASIX) 20 MG tablet Take one po qd prn pedal edema   gentamicin (GARAMYCIN) 0.3 % ophthalmic solution Place 1 drop into the left eye 4 (four) times daily for 5 days.   hydrochlorothiazide (HYDRODIURIL) 25 MG tablet Take 1 tablet (25 mg total) by mouth daily.   hydrOXYzine (ATARAX) 25 MG tablet Take 1 tablet (25 mg total) by mouth at bedtime as needed.   ketoconazole (NIZORAL) 2 % cream Apply to body folds once to twice daily.   Levothyroxine Sodium (TIROSINT-SOL) 100 MCG/ML SOLN Take 1 mL by mouth daily.   loratadine (CLARITIN) 10 MG tablet Take 10 mg by mouth daily.   omeprazole (PRILOSEC) 40 MG capsule Take 1 capsule (40 mg total) by mouth daily.   pimecrolimus (ELIDEL) 1 % cream Apply twice daily to affected areas   tacrolimus (PROTOPIC) 0.1 % ointment Apply topically 2 (two) times daily.   valsartan (DIOVAN) 160 MG tablet Take 1 tablet (160 mg total) by mouth daily.   No facility-administered encounter  medications on file as of 10/07/2022.      Medical History: Past Medical History:  Diagnosis Date   Allergy    Basal cell carcinoma 06/17/2017   right chest    GERD (gastroesophageal reflux disease)    Hypertension    Left tennis elbow    Morbid obesity (HCC) 07/13/2016     Vital Signs: Pulse 97   Temp 98.1 F (36.7 C)   LMP 11/22/2013 (Approximate)   SpO2 97%    Review of Systems  Constitutional:  Negative for chills, fatigue and fever.  Musculoskeletal:  Positive for joint swelling.    Physical Exam Vitals and nursing note reviewed.  Constitutional:      Appearance: Normal appearance.  Musculoskeletal:     Comments: Left ankle swelling and point tenderness posterior to lateral malleolus.   Neurological:     Mental Status: She is alert.    Assessment/Plan: 1. Acute left ankle pain Discussed R.I.C.E.  Rest, Ice 20 minutes on, with at least two hours of off time between icings.  Use a barrier between skin and ice.  Take Ibuprofen 600-800mg  ever 6-8 hours for inflammation.  Compression with Ace-Wrap or brace.  Keep elevated whenever possible.        General Counseling: elwyn ivery understanding of the findings of todays visit and agrees with plan of treatment. I have discussed any further diagnostic evaluation  that may be needed or ordered today. We also reviewed her medications today. she has been encouraged to call the office with any questions or concerns that should arise related to todays visit.   No orders of the defined types were placed in this encounter.   No orders of the defined types were placed in this encounter.   Time spent:20 Minutes    Johnna Acosta AGNP-C Nurse Practitioner

## 2022-10-13 ENCOUNTER — Ambulatory Visit (INDEPENDENT_AMBULATORY_CARE_PROVIDER_SITE_OTHER): Payer: BC Managed Care – PPO

## 2022-10-13 ENCOUNTER — Ambulatory Visit: Payer: BC Managed Care – PPO | Admitting: Podiatry

## 2022-10-13 ENCOUNTER — Encounter: Payer: Self-pay | Admitting: Podiatry

## 2022-10-13 VITALS — BP 179/79 | HR 88

## 2022-10-13 DIAGNOSIS — M7662 Achilles tendinitis, left leg: Secondary | ICD-10-CM | POA: Diagnosis not present

## 2022-10-13 DIAGNOSIS — M7752 Other enthesopathy of left foot: Secondary | ICD-10-CM | POA: Diagnosis not present

## 2022-10-13 DIAGNOSIS — M7750 Other enthesopathy of unspecified foot: Secondary | ICD-10-CM

## 2022-10-13 NOTE — Progress Notes (Signed)
Chief Complaint  Patient presents with   Foot Pain    "I fell on last Wednesday.  My foot hurts in the back.  My achilles actually feel better." N - foot hurts in back L - posterior heel pain D - Wed of last week O - suddenly C - ache, numbness, weak A - walking T - 800 mg Ibuprofen 3 times a day, backed off to 400 mg b/c feet swelling, iced, elevated    HPI: 62 y.o. female presenting today for follow-up evaluation of bilateral posterior heel pain that has been going on for several years.  Patient states that she had been doing very well however she sustained an injury to the left posterior heel.  Patient states that since the injury she has had some improvement of her Achilles tendon pain.  Presenting for further treatment and evaluation  Past Medical History:  Diagnosis Date   Allergy    Basal cell carcinoma 06/17/2017   right chest    GERD (gastroesophageal reflux disease)    Hypertension    Left tennis elbow    Morbid obesity (HCC) 07/13/2016    Past Surgical History:  Procedure Laterality Date   COLONOSCOPY     COLONOSCOPY WITH PROPOFOL N/A 10/14/2015   Procedure: COLONOSCOPY WITH PROPOFOL;  Surgeon: Midge Minium, MD;  Location: [facility];  Service: Endoscopy;  Laterality: N/A;   NO PAST SURGERIES      Allergies  Allergen Reactions   Mobic [Meloxicam] Swelling    In hands and ankles and under eyes.   Polytrim [Polymyxin B-Trimethoprim] Itching    Itching of eyes after use. Patient allergic to sulfa   Azithromycin Rash   Erythromycin     Rash, GI upset   Influenza Vac Split Quad Hives    Possible allergy- rash after injection Still takes yearly flu vaccine   Shrimp [Shellfish Allergy] Other (See Comments)    Headache when eaten in excess   Sulfa Antibiotics Other (See Comments)    Unknown, can not remember   Synthroid [Levothyroxine] Hives    Brand and generic      Physical Exam: General: The patient is alert and oriented x3 in no acute  distress.  Dermatology: Skin is warm, dry and supple bilateral lower extremities. Negative for open lesions or macerations.  Vascular: Palpable pedal pulses bilaterally.  There is some mild edema noted to the bilateral lower extremities. Neurological: Grossly intact via light touch  Musculoskeletal Exam: There continues to be some pain on palpation noted to the posterior tubercle of the right calcaneus at the insertion of the Achilles tendon consistent with retrocalcaneal bursitis. Range of motion within normal limits. Muscle strength 5/5 in all muscle groups bilateral lower extremities.  Radiographic Exam LT foot 10/13/2022:  Posterior and plantar calcaneal spur noted to the respective calcaneus on lateral view. No fracture or dislocation noted. Normal osseous mineralization noted.  Impression: Posterior heel spur  Assessment: 1.  Chronic insertional Achilles tendinitis bilateral 2.  Posterior heel spur left  Plan of Care:  1. Patient was evaluated. Radiographs were reviewed today. 2.  I do believe the patient would benefit from physical therapy at this time.  She states that she feels very unsteady and unstable to her lower extremities.  Prescription provided for physical therapy at [facility] 3.  Continue Motrin 800 mg as needed 4.  Return to clinic 8 weeks.  If there is no improvement we may need to consider MRI  *Husband is Acupuncturist.  I  did achilles rupture surgery on him about 1 year ago.    Felecia Shelling, DPM [facility]  Dr. Felecia Shelling, DPM    [address] [place], [place] [address]                Office [phone]  Fax [phone]

## 2022-10-15 ENCOUNTER — Other Ambulatory Visit: Payer: Self-pay | Admitting: Family

## 2022-10-15 DIAGNOSIS — R600 Localized edema: Secondary | ICD-10-CM

## 2022-10-20 ENCOUNTER — Other Ambulatory Visit: Payer: Self-pay | Admitting: Family

## 2022-10-20 DIAGNOSIS — K219 Gastro-esophageal reflux disease without esophagitis: Secondary | ICD-10-CM

## 2022-10-26 ENCOUNTER — Ambulatory Visit (INDEPENDENT_AMBULATORY_CARE_PROVIDER_SITE_OTHER): Payer: Self-pay | Admitting: Physician Assistant

## 2022-10-26 ENCOUNTER — Encounter: Payer: Self-pay | Admitting: Physician Assistant

## 2022-10-26 ENCOUNTER — Other Ambulatory Visit: Payer: Self-pay

## 2022-10-26 VITALS — BP 184/54 | HR 98 | Temp 98.9°F

## 2022-10-26 DIAGNOSIS — H1013 Acute atopic conjunctivitis, bilateral: Secondary | ICD-10-CM

## 2022-10-26 NOTE — Progress Notes (Signed)
[facility] [address] [place], [place] [address]   Office Visit Note  Patient Name: Bridget Reed Date of Birth [id number]  Medical Record number [id number]  Date of Service: 10/26/2022  Chief Complaint  Patient presents with   Eye Problem    Seen for a that same problem a few weeks ago, states that everything looks cloudy, just wears reading glasses, would like to be referred to a specialist     62 y/o F presents to the clinic for a f/u for her left eye redness and matting. She used gentamicin eye drops as previously prescribed. Felt somewhat better afterwards, but has continued to have a "fuzzy film" over the eyes with matting in the morning. She had to use a cold wash cloth to wipe out the matting on her left eye. Denies photosensitivity. Denies dizziness, headache. No eye trauma since her last visit. No use of contact lens.   Eye Problem  Associated symptoms include eye redness and itching. Pertinent negatives include no eye discharge or photophobia.      Current Medication:  Outpatient Encounter Medications as of 10/26/2022  Medication Sig   Dupilumab (DUPIXENT) 300 MG/2ML SOPN Inject 300 mg into the skin every 14 (fourteen) days. Starting at day 15 for maintenance.   fexofenadine (ALLEGRA) 60 MG tablet Take 60 mg by mouth 2 (two) times daily.   furosemide (LASIX) 20 MG tablet Take one po qd prn pedal edema   hydrochlorothiazide (HYDRODIURIL) 25 MG tablet Take 1 tablet (25 mg total) by mouth daily.   hydrOXYzine (ATARAX) 25 MG tablet Take 1 tablet (25 mg total) by mouth at bedtime as needed.   ketoconazole (NIZORAL) 2 % cream Apply to body folds once to twice daily.   Levothyroxine Sodium (TIROSINT-SOL) 100 MCG/ML SOLN Take 1 mL by mouth daily.   omeprazole (PRILOSEC) 40 MG capsule TAKE 1 CAPSULE (40 MG TOTAL) BY MOUTH DAILY.   tacrolimus (PROTOPIC) 0.1 % ointment Apply topically 2 (two) times daily.   valsartan (DIOVAN) 160 MG tablet Take 1 tablet (160 mg total)  by mouth daily.   [DISCONTINUED] clobetasol cream (TEMOVATE) 0.05 % Apply twice daily for 2 weeks. Avoid applying to face, groin, and axilla.   [DISCONTINUED] loratadine (CLARITIN) 10 MG tablet Take 10 mg by mouth daily.   [DISCONTINUED] pimecrolimus (ELIDEL) 1 % cream Apply twice daily to affected areas   No facility-administered encounter medications on file as of 10/26/2022.      Medical History: Past Medical History:  Diagnosis Date   Allergy    Basal cell carcinoma 06/17/2017   right chest    GERD (gastroesophageal reflux disease)    Hypertension    Left tennis elbow    Morbid obesity (HCC) 07/13/2016     Vital Signs: BP (!) 184/54 Comment: machine, low right wrist  Pulse 98   Temp 98.9 F (37.2 C) (Tympanic)   LMP 11/22/2013 (Approximate)   SpO2 97%    Review of Systems  Constitutional: Negative.   HENT: Negative.    Eyes:  Positive for redness and itching. Negative for photophobia, pain, discharge and visual disturbance.       Excessive tearing  Neurological: Negative.     Physical Exam Constitutional:      Appearance: Normal appearance.  HENT:     Head: Normocephalic and atraumatic.     Right Ear: External ear normal.     Left Ear: External ear normal.  Eyes:     General:  Right eye: No discharge.        Left eye: No discharge.     Extraocular Movements: Extraocular movements intact.     Conjunctiva/sclera: Conjunctivae normal.     Pupils: Pupils are equal, round, and reactive to light.  Pulmonary:     Effort: Pulmonary effort is normal.  Skin:    General: Skin is warm and dry.  Neurological:     Mental Status: She is alert and oriented to person, place, and time.  Psychiatric:        Mood and Affect: Mood normal.        Behavior: Behavior normal.       Assessment/Plan:  1. Allergic conjunctivitis of both eyes  Take Claritin along with Allegra Use Zaditor eye drops as instructed. Continue to watch for worsening symptoms. Will  consider a referral to ophthalmologist.  Recommended to seek an appointment with Optometrist if symptoms worsen. She tells me that she hasn't been to an Optometrist for many years.  Pt will contact us in 8 days or sooner if symptoms worsen. Pt verbalized understanding and in agreement.   General Counseling: delanna jobst understanding of the findings of todays visit and agrees with plan of treatment. I have discussed any further diagnostic evaluation that may be needed or ordered today. We also reviewed her medications today. she has been encouraged to call the office with any questions or concerns that should arise related to todays visit.    Time spent:20 Minutes    Gilberto Better, [place] Physician Assistant

## 2022-10-30 ENCOUNTER — Ambulatory Visit: Payer: BC Managed Care – PPO | Admitting: Podiatry

## 2022-10-30 DIAGNOSIS — M7661 Achilles tendinitis, right leg: Secondary | ICD-10-CM | POA: Diagnosis not present

## 2022-10-30 MED ORDER — METHYLPREDNISOLONE 4 MG PO TBPK: ORAL_TABLET | ORAL | 0 refills | Status: DC

## 2022-10-30 NOTE — Progress Notes (Signed)
Chief Complaint  Patient presents with   Tendonitis    Left is better, now right is very painful, maybe from overcompensating for the left - bottom and back of heel pain    HPI: 62 y.o. female presenting today for follow-up evaluation of bilateral posterior heel pain that has been going on for several years.  Patient states that she was doing well however she believes she was compensating significantly putting excessive pressure on the right lower extremity.  She now has right posterior heel pain greater than the left.  Presenting for further treatment and evaluation  Past Medical History:  Diagnosis Date   Allergy    Basal cell carcinoma 06/17/2017   right chest    GERD (gastroesophageal reflux disease)    Hypertension    Left tennis elbow    Morbid obesity (HCC) 07/13/2016    Past Surgical History:  Procedure Laterality Date   COLONOSCOPY     COLONOSCOPY WITH PROPOFOL N/A 10/14/2015   Procedure: COLONOSCOPY WITH PROPOFOL;  Surgeon: Midge Minium, MD;  Location: [facility];  Service: Endoscopy;  Laterality: N/A;   NO PAST SURGERIES      Allergies  Allergen Reactions   Mobic [Meloxicam] Swelling    In hands and ankles and under eyes.   Polytrim [Polymyxin B-Trimethoprim] Itching    Itching of eyes after use. Patient allergic to sulfa   Azithromycin Rash   Erythromycin     Rash, GI upset   Influenza Vac Split Quad Hives    Possible allergy- rash after injection Still takes yearly flu vaccine   Shrimp [Shellfish Allergy] Other (See Comments)    Headache when eaten in excess   Sulfa Antibiotics Other (See Comments)    Unknown, can not remember   Synthroid [Levothyroxine] Hives    Brand and generic      Physical Exam: General: The patient is alert and oriented x3 in no acute distress.  Dermatology: Skin is warm, dry and supple bilateral lower extremities. Negative for open lesions or macerations.  Vascular: Palpable pedal pulses bilaterally.  There is some  mild edema noted to the bilateral lower extremities. Neurological: Grossly intact via light touch  Musculoskeletal Exam: There continues to be some pain on palpation noted to the posterior tubercle of the right calcaneus at the insertion of the Achilles tendon consistent with retrocalcaneal bursitis. Range of motion within normal limits. Muscle strength 5/5 in all muscle groups bilateral lower extremities.  Radiographic Exam LT foot 10/13/2022:  Posterior and plantar calcaneal spur noted to the respective calcaneus on lateral view. No fracture or dislocation noted. Normal osseous mineralization noted.  Impression: Posterior heel spur  Assessment: 1.  Chronic insertional Achilles tendinitis bilateral 2.  Posterior heel spur left    Plan of Care:  -Patient evaluated -Patient has appointment pending with [facility] on 11/12/2022.  Keep that appointment to initiate physical therapy -Prescription for Medrol Dosepak -Continue OTC naproxen as needed -Continue daily stretching exercises -Return to clinic as needed  *Husband is Acupuncturist.  I did achilles rupture surgery on him about 1 year ago.    Felecia Shelling, DPM [facility]  Dr. Felecia Shelling, DPM    [address], [place] [address]  Office [phone]  Fax [phone]

## 2022-11-06 ENCOUNTER — Other Ambulatory Visit: Payer: Self-pay | Admitting: Family

## 2022-11-06 DIAGNOSIS — R600 Localized edema: Secondary | ICD-10-CM

## 2022-11-06 DIAGNOSIS — E039 Hypothyroidism, unspecified: Secondary | ICD-10-CM

## 2022-11-15 ENCOUNTER — Other Ambulatory Visit: Payer: Self-pay | Admitting: Family

## 2022-11-15 DIAGNOSIS — R600 Localized edema: Secondary | ICD-10-CM

## 2022-11-17 ENCOUNTER — Telehealth: Payer: Self-pay

## 2022-11-17 NOTE — Telephone Encounter (Signed)
Patient LVM today at 2:12 pm that she is having significant eye redness, dryness and tearing since being on the Dupixent. She has been on Dupixent injections for 8-10 weeks and c/o having to use eye drops all of the time. Patient is due for her next injection in 2 weeks and wonders if she should take it. Butch Penny., RMA

## 2022-11-17 NOTE — Telephone Encounter (Signed)
Spoke with patient and she has been using ketotifen eyedrops. She is due for her next Dupixent injection this Thursday, which she is not going to take but would like to know how long she should be off of it before starting Adbry.

## 2022-11-18 NOTE — Telephone Encounter (Signed)
Patient advised that once her eyes have cleared she should let us know so we can get her in to possibly start Adbry samples. Patient is concerned that she could get the same side effects and asked if there is a pill to take. I did tell her that the medications work differently and she may not have any eye problems on Adbry and that there is a pill taken by mouth, Rinvoq. Patient will let us know when eyes have cleared.  Butch Penny., RMA

## 2022-12-08 ENCOUNTER — Ambulatory Visit: Payer: BC Managed Care – PPO | Admitting: Podiatry

## 2023-01-04 ENCOUNTER — Encounter: Payer: Self-pay | Admitting: Physician Assistant

## 2023-01-04 ENCOUNTER — Ambulatory Visit (INDEPENDENT_AMBULATORY_CARE_PROVIDER_SITE_OTHER): Payer: Self-pay | Admitting: Physician Assistant

## 2023-01-04 ENCOUNTER — Other Ambulatory Visit: Payer: Self-pay

## 2023-01-04 VITALS — BP 164/64 | HR 105 | Temp 98.5°F

## 2023-01-04 DIAGNOSIS — H60332 Swimmer's ear, left ear: Secondary | ICD-10-CM

## 2023-01-04 MED ORDER — OFLOXACIN 0.3 % OT SOLN
5.0000 [drp] | Freq: Every day | OTIC | 0 refills | Status: AC
Start: 2023-01-04 — End: 2023-01-11

## 2023-01-04 NOTE — Progress Notes (Signed)
[facility] [address] [place], [place] [address]   Office Visit Note  Patient Name: Bridget Reed Date of Birth [id number]  Medical Record number [id number]  Date of Service: 01/04/2023  Chief Complaint  Patient presents with   Ear Pain    Left ear pain, started Friday night got worse on Saturday, had some left over amoxicillin-states she took 2 doses on Saturday and 3 doses yesterday, states she went swimming a week ago and this may be the cause of it, states it is a pain and full ness     62 y/o F presents to the clinic for c/o left ear pain x few days. Onset of pain post swimming in the pool few days earlier. Denies ear drainage, n/v/dizziness. She denies ringing in the ear. Started taking previously prescribed oral antibiotic and has helped somewhat.       Current Medication:  Outpatient Encounter Medications as of 01/04/2023  Medication Sig   ofloxacin (FLOXIN) 0.3 % OTIC solution Place 5 drops into the left ear daily for 7 days.   Dupilumab (DUPIXENT) 300 MG/2ML SOPN Inject 300 mg into the skin every 14 (fourteen) days. Starting at day 15 for maintenance.   fexofenadine (ALLEGRA) 60 MG tablet Take 60 mg by mouth 2 (two) times daily.   furosemide (LASIX) 20 MG tablet TAKE ONE TABLET BY MOUTH AS NEEDED FOR EDEMA   hydrochlorothiazide (HYDRODIURIL) 25 MG tablet Take 1 tablet (25 mg total) by mouth daily.   hydrOXYzine (ATARAX) 25 MG tablet Take 1 tablet (25 mg total) by mouth at bedtime as needed.   ketoconazole (NIZORAL) 2 % cream Apply to body folds once to twice daily.   omeprazole (PRILOSEC) 40 MG capsule TAKE 1 CAPSULE (40 MG TOTAL) BY MOUTH DAILY.   tacrolimus (PROTOPIC) 0.1 % ointment Apply topically 2 (two) times daily.   TIROSINT-SOL 100 MCG/ML SOLN TAKE BY MOUTH DAILY   valsartan (DIOVAN) 160 MG tablet Take 1 tablet (160 mg total) by mouth daily.   [DISCONTINUED] methylPREDNISolone (MEDROL DOSEPAK) 4 MG TBPK tablet 6 day dose pack - take as directed  (Patient not taking: Reported on 01/04/2023)   No facility-administered encounter medications on file as of 01/04/2023.      Medical History: Past Medical History:  Diagnosis Date   Allergy    Basal cell carcinoma 06/17/2017   right chest    GERD (gastroesophageal reflux disease)    Hypertension    Left tennis elbow    Morbid obesity (HCC) 07/13/2016     Vital Signs: BP (!) 164/64 (BP Location: Left Arm, Patient Position: Sitting, Cuff Size: Large)   Pulse (!) 105   Temp 98.5 F (36.9 C)   LMP 11/22/2013 (Approximate)   SpO2 98%    Review of Systems  Constitutional: Negative.   HENT:  Positive for ear pain and trouble swallowing. Negative for congestion, ear discharge, postnasal drip, sinus pressure, sinus pain and sore throat.   Respiratory: Negative.    Cardiovascular: Negative.   Neurological:  Negative for dizziness and headaches.    Physical Exam Constitutional:      Appearance: Normal appearance.  HENT:     Head: Atraumatic.     Right Ear: Tympanic membrane, ear canal and external ear normal.     Left Ear: Tympanic membrane and external ear normal. Swelling and tenderness present.     Ears:     Comments: Left ear canal : +erythema with swelling present.     Nose:  Nose normal.  Eyes:     Extraocular Movements: Extraocular movements intact.  Musculoskeletal:     Cervical back: Neck supple.  Skin:    General: Skin is warm.  Neurological:     Mental Status: She is alert.  Psychiatric:        Mood and Affect: Mood normal.        Behavior: Behavior normal.        Thought Content: Thought content normal.        Judgment: Judgment normal.       Assessment/Plan:  1. Acute swimmer's ear of left side - ofloxacin (FLOXIN) 0.3 % OTIC solution; Place 5 drops into the left ear daily for 7 days.  Dispense: 5 mL; Refill: 0  Apply ear drops as prescribed. Apply warm moist compress to the left ear to help soothe the pain Take otc Tylenol or Ibuprofen to help  with ear pain Continue to watch for worsening symptoms. RTC prn Pt verbalized understanding and in agreement.   General Counseling: Bridget Reed understanding of the findings of todays visit and agrees with plan of treatment. I have discussed any further diagnostic evaluation that may be needed or ordered today. We also reviewed her medications today. she has been encouraged to call the office with any questions or concerns that should arise related to todays visit.    Time spent:20 Minutes    Gilberto Better, [place] Physician Assistant

## 2023-01-05 ENCOUNTER — Encounter: Payer: Self-pay | Admitting: Physician Assistant

## 2023-01-06 ENCOUNTER — Other Ambulatory Visit: Payer: Self-pay | Admitting: Physician Assistant

## 2023-01-06 DIAGNOSIS — B379 Candidiasis, unspecified: Secondary | ICD-10-CM

## 2023-01-06 MED ORDER — FLUCONAZOLE 150 MG PO TABS
ORAL_TABLET | ORAL | 0 refills | Status: DC
Start: 2023-01-06 — End: 2023-01-27

## 2023-01-06 NOTE — Progress Notes (Signed)
Pt requested Rx for yeast infection.

## 2023-01-27 ENCOUNTER — Ambulatory Visit: Payer: BC Managed Care – PPO | Admitting: Family

## 2023-01-27 ENCOUNTER — Encounter: Payer: Self-pay | Admitting: Family

## 2023-01-27 VITALS — BP 136/84 | HR 78 | Temp 97.8°F | Ht 62.0 in | Wt 262.0 lb

## 2023-01-27 DIAGNOSIS — Z9189 Other specified personal risk factors, not elsewhere classified: Secondary | ICD-10-CM

## 2023-01-27 DIAGNOSIS — Z6841 Body Mass Index (BMI) 40.0 and over, adult: Secondary | ICD-10-CM

## 2023-01-27 DIAGNOSIS — R0609 Other forms of dyspnea: Secondary | ICD-10-CM

## 2023-01-27 DIAGNOSIS — L2989 Other pruritus: Secondary | ICD-10-CM

## 2023-01-27 DIAGNOSIS — L298 Other pruritus: Secondary | ICD-10-CM

## 2023-01-27 DIAGNOSIS — T781XXA Other adverse food reactions, not elsewhere classified, initial encounter: Secondary | ICD-10-CM

## 2023-01-27 DIAGNOSIS — R600 Localized edema: Secondary | ICD-10-CM

## 2023-01-27 DIAGNOSIS — E871 Hypo-osmolality and hyponatremia: Secondary | ICD-10-CM | POA: Diagnosis not present

## 2023-01-27 DIAGNOSIS — E039 Hypothyroidism, unspecified: Secondary | ICD-10-CM

## 2023-01-27 DIAGNOSIS — R61 Generalized hyperhidrosis: Secondary | ICD-10-CM | POA: Diagnosis not present

## 2023-01-27 DIAGNOSIS — K219 Gastro-esophageal reflux disease without esophagitis: Secondary | ICD-10-CM

## 2023-01-27 DIAGNOSIS — T7819XA Other adverse food reactions, not elsewhere classified, initial encounter: Secondary | ICD-10-CM

## 2023-01-27 LAB — CBC WITH DIFFERENTIAL/PLATELET
Basophils Absolute: 0.1 10*3/uL (ref 0.0–0.1)
Basophils Relative: 0.6 % (ref 0.0–3.0)
Eosinophils Absolute: 0.3 10*3/uL (ref 0.0–0.7)
Eosinophils Relative: 3.3 % (ref 0.0–5.0)
HCT: 46.3 % — ABNORMAL HIGH (ref 36.0–46.0)
Hemoglobin: 15.2 g/dL — ABNORMAL HIGH (ref 12.0–15.0)
Lymphocytes Relative: 18.1 % (ref 12.0–46.0)
Lymphs Abs: 1.9 10*3/uL (ref 0.7–4.0)
MCHC: 32.9 g/dL (ref 30.0–36.0)
MCV: 88.7 fl (ref 78.0–100.0)
Monocytes Absolute: 1 10*3/uL (ref 0.1–1.0)
Monocytes Relative: 9.5 % (ref 3.0–12.0)
Neutro Abs: 7.1 10*3/uL (ref 1.4–7.7)
Neutrophils Relative %: 68.5 % (ref 43.0–77.0)
Platelets: 254 10*3/uL (ref 150.0–400.0)
RBC: 5.21 Mil/uL — ABNORMAL HIGH (ref 3.87–5.11)
RDW: 13.9 % (ref 11.5–15.5)
WBC: 10.4 10*3/uL (ref 4.0–10.5)

## 2023-01-27 LAB — BASIC METABOLIC PANEL
BUN: 13 mg/dL (ref 6–23)
CO2: 29 meq/L (ref 19–32)
Calcium: 9.8 mg/dL (ref 8.4–10.5)
Chloride: 95 meq/L — ABNORMAL LOW (ref 96–112)
Creatinine, Ser: 0.59 mg/dL (ref 0.40–1.20)
GFR: 97.52 mL/min (ref >60.00)
Glucose, Bld: 115 mg/dL — ABNORMAL HIGH (ref 70–99)
Potassium: 3.8 meq/L (ref 3.5–5.1)
Sodium: 134 meq/L — ABNORMAL LOW (ref 135–145)

## 2023-01-27 LAB — T4, FREE: Free T4: 1.2 ng/dL (ref 0.60–1.60)

## 2023-01-27 LAB — TSH: TSH: 2.18 u[IU]/mL (ref 0.35–5.50)

## 2023-01-27 LAB — HEMOGLOBIN A1C: Hgb A1c MFr Bld: 5.9 % (ref 4.6–6.5)

## 2023-01-27 MED ORDER — OMEPRAZOLE 20 MG PO CPDR
20.0000 mg | DELAYED_RELEASE_CAPSULE | Freq: Every day | ORAL | 3 refills | Status: DC
Start: 2023-01-27 — End: 2023-04-20

## 2023-01-27 MED ORDER — WEGOVY 0.5 MG/0.5ML ~~LOC~~ SOAJ
SUBCUTANEOUS | 2 refills | Status: DC
Start: 2023-01-27 — End: 2023-04-12

## 2023-01-27 MED ORDER — WEGOVY 0.25 MG/0.5ML ~~LOC~~ SOAJ
SUBCUTANEOUS | 0 refills | Status: DC
Start: 2023-01-27 — End: 2023-04-12

## 2023-01-27 NOTE — Assessment & Plan Note (Signed)
LDH to repeat because now with excessive sweating.  Cxr only if labs negative , pt aware.  Continue f/u with derm as scheduled.  Low suspicion for candidal overgrowth due to lack of consistent symptoms

## 2023-01-27 NOTE — Patient Instructions (Signed)
------------------------------------    I sent in a prescription for a weight loss medication called Wegovy.  If approved you can start this as prescribed. Sometimes we need to complete a prior auth prior to approval.   If not approved,  I will have to refer you to our weight loss clinic where they might have better approval odds, and can help you on your weight loss journey.   If approved, you will start 2.5 mg once weekly for four weeks, then increase to 5.0 mg once weekly if tolerating well after four weeks. Be sure to make a follow up appointment in three months after starting the medication so we can document weight loss, and monitor how you are doing on the medication.   Bring with you your new diet plan and weight weigh ins at least once weekly to document your journey.   Some apps to look at our myfitnesspal and or the [employer] app.  Also look at freshmealplan.com if you prefer pre made meals that are delivered to your door step   ------------------------------------  Stop by the lab prior to leaving today. I will notify you of your results once received.   ------------------------------------

## 2023-01-27 NOTE — Assessment & Plan Note (Signed)
Improving with d/c of amlodipine and start of hydrochlorothiazide

## 2023-01-27 NOTE — Assessment & Plan Note (Signed)
Repeat today pending results.  Consider diuretic use

## 2023-01-27 NOTE — Assessment & Plan Note (Signed)
First ordering LDH repeat, tsh and free t4 pending results.  Then if negative ordering Cxr.  Ddx hyperthyroid due to over suppression with Tsh vs NHL

## 2023-01-27 NOTE — Assessment & Plan Note (Addendum)
Trial wean down on omeprazole I feel this may be contributing to her GI issues for chronic ppi therapy. Advised to go down on ppi as with long term use, and to work on dietary changes.  Try to decrease and or avoid spicy foods, fried fatty foods, and also caffeine and chocolate as these can increase heartburn symptoms.

## 2023-01-27 NOTE — Assessment & Plan Note (Signed)
Pt advised to work on diet and exercise as tolerated  Long d/w pt on weight loss medication options as well as a plan for exercising and diet changes, and goals that we can implement to meet desired weight loss goals. Pt advised to start a healthy diet and exercise program alongside taking this medication. Printout given for healthy eating and exercise recommendations.   Did advise pt that I will prescribe zepbound 2.5mg . If this is not approved pt was advised I will have to refer her to weight loss clinic where they may be able to provide alternative options.   Discussed with patient various weight loss medications we could try to apply for with insurance.  Also advised her this may not be approved, and we can try for prior auth and it still may not be approved.  If not approved, we will need to refer to weight loss medical clinic.  If approved, advised pt to f/u within three months after starting to document weight loss and also document how pt is tolerating medication.   The beneficiary does not have any FDA labeled contraindications to the requested agent including pregnancy, lactation, h/o medullary thyroid cancer or multiple endocrine neoplasia type II.

## 2023-01-27 NOTE — Assessment & Plan Note (Signed)
Recommendation to continue with gluten free as seems to exacerbate symptoms.

## 2023-01-27 NOTE — Progress Notes (Signed)
Established Patient Office Visit  Subjective:      CC:  Chief Complaint  Patient presents with   Hypothyroidism    HPI: Bridget Reed is a 62 y.o. female presenting on 01/27/2023 for Hypothyroidism . 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 She states she has increasing heartburn and fleeting rash (of which she has had for some time) . She is seeing dermatology for ongoing pruritus and urticaria and is currently on dupixant which she states has helped some. Took some leftover diflucan for similar rash that appeared on her face and she states it was gone the next day but also chronic rash will be fleeting as well. She does have GI upset when she eats yeasty foods, such as bread.   Increased sweating profusely states has been ongoing over the last few months. She states at first felt it was the humidity however last night was cooler and she states that she still had a 'sweating episode'. She states sometimes will be once a day, not always once a day, but then at times multiple times a day. Does not seem to correlate with when she has eaten. Not waking up at night with night sweats. She denies dizziness or fatigue at time of onset. LDH 01/2022, never had a chest xray.    Heartburn, on omeprazole 40 mg once daily for years. She states does eat fried fatty foods, one coffee a day, no spicy foods. Never had EGD.   Hypothyroid: she is curious if sweats are contributing to this. She does note some slight increased fatigue but not excessive. Currently on tirosint 100 mcg once daily and tolerating well.  Lab Results  Component Value Date   TSH 1.85 09/23/2022     Social history:  Relevant past medical, surgical, family and social history reviewed and updated as indicated. Interim medical history since our last visit reviewed.  Allergies and medications reviewed and updated.  DATA REVIEWED: CHART IN EPIC     ROS: Negative unless specifically indicated above in HPI.     Current Outpatient Medications:    omeprazole (PRILOSEC) 20 MG capsule, Take 1 capsule (20 mg total) by mouth daily., Disp: 30 capsule, Rfl: 3   Semaglutide-Weight Management (WEGOVY) 0.25 MG/0.5ML SOAJ, Start 0.25 mg qweek for four weeks, then increase to 0.5 mg dose qweek, Disp: 4 each, Rfl: 0   Semaglutide-Weight Management (WEGOVY) 0.5 MG/0.5ML SOAJ, After four weeks of 0.25 mg qweek, increase to 0.5 mg qweek, Disp: 4 each, Rfl: 2   Dupilumab (DUPIXENT) 300 MG/2ML SOPN, Inject 300 mg into the skin every 14 (fourteen) days. Starting at day 15 for maintenance., Disp: 4 mL, Rfl: 5   fexofenadine (ALLEGRA) 60 MG tablet, Take 60 mg by mouth 2 (two) times daily., Disp: , Rfl:    furosemide (LASIX) 20 MG tablet, TAKE ONE TABLET BY MOUTH AS NEEDED FOR EDEMA, Disp: 30 tablet, Rfl: 0   hydrochlorothiazide (HYDRODIURIL) 25 MG tablet, Take 1 tablet (25 mg total) by mouth daily., Disp: 90 tablet, Rfl: 3   hydrOXYzine (ATARAX) 25 MG tablet, Take 1 tablet (25 mg total) by mouth at bedtime as needed., Disp: 30 tablet, Rfl: 5   ketoconazole (NIZORAL) 2 % cream, Apply to body folds once to twice daily., Disp: 60 g, Rfl: 5   TIROSINT-SOL 100 MCG/ML SOLN, TAKE BY MOUTH DAILY, Disp: 90 mL, Rfl: 0   valsartan (DIOVAN) 160 MG tablet, Take 1 tablet (160 mg total) by mouth daily., Disp: 90 tablet,  Rfl: 3      Objective:    BP 136/84 (BP Location: Left Arm, Patient Position: Sitting, Cuff Size: Large)   Pulse 78   Temp 97.8 F (36.6 C) (Temporal)   Ht 5\' 2"  (1.575 m)   Wt 262 lb (118.8 kg)   LMP 11/22/2013 (Approximate)   SpO2 96%   BMI 47.92 kg/m   Wt Readings from Last 3 Encounters:  01/27/23 262 lb (118.8 kg)  10/05/22 259 lb (117.5 kg)  09/23/22 260 lb 12.8 oz (118.3 kg)    Physical Exam Constitutional:      General: She is not in acute distress.    Appearance: Normal appearance. She is obese. She is not ill-appearing, toxic-appearing or diaphoretic.  HENT:     Head: Normocephalic.      Right Ear: Tympanic membrane normal.     Left Ear: Tympanic membrane normal.     Nose: Nose normal.     Mouth/Throat:     Mouth: Mucous membranes are dry.     Pharynx: No oropharyngeal exudate or posterior oropharyngeal erythema.  Eyes:     Extraocular Movements: Extraocular movements intact.     Pupils: Pupils are equal, round, and reactive to light.  Cardiovascular:     Rate and Rhythm: Normal rate and regular rhythm.     Pulses: Normal pulses.     Heart sounds: Normal heart sounds.  Pulmonary:     Effort: Pulmonary effort is normal.     Breath sounds: Normal breath sounds.  Musculoskeletal:     Cervical back: Normal range of motion.  Neurological:     General: No focal deficit present.     Mental Status: She is alert and oriented to person, place, and time. Mental status is at baseline.  Psychiatric:        Mood and Affect: Mood normal.        Behavior: Behavior normal.        Thought Content: Thought content normal.        Judgment: Judgment normal.           Assessment & Plan:  Excessive sweating Assessment & Plan: First ordering LDH repeat, tsh and free t4 pending results.  Then if negative ordering Cxr.  Ddx hyperthyroid due to over suppression with Tsh vs NHL  Orders: -     CBC with Differential/Platelet -     DG Chest 2 View; Future -     Lactate dehydrogenase  Hyponatremia Assessment & Plan: Repeat today pending results.  Consider diuretic use  Orders: -     Basic metabolic panel  Chronic pruritic rash in adult Assessment & Plan: LDH to repeat because now with excessive sweating.  Cxr only if labs negative , pt aware.  Continue f/u with derm as scheduled.  Low suspicion for candidal overgrowth due to lack of consistent symptoms   Orders: -     DG Chest 2 View; Future -     Lactate dehydrogenase  Acquired hypothyroidism -     TSH -     T4, free  Gastroesophageal reflux disease without esophagitis Assessment & Plan: Trial wean down on  omeprazole I feel this may be contributing to her GI issues for chronic ppi therapy. Advised to go down on ppi as with long term use, and to work on dietary changes.  Try to decrease and or avoid spicy foods, fried fatty foods, and also caffeine and chocolate as these can increase heartburn symptoms.    Orders: -  Omeprazole; Take 1 capsule (20 mg total) by mouth daily.  Dispense: 30 capsule; Refill: 3  DOE (dyspnea on exertion) -     DG Chest 2 View; Future  Morbid obesity with BMI of 45.0-49.9, adult [facility]) Assessment & Plan: Pt advised to work on diet and exercise as tolerated  Long d/w pt on weight loss medication options as well as a plan for exercising and diet changes, and goals that we can implement to meet desired weight loss goals. Pt advised to start a healthy diet and exercise program alongside taking this medication. Printout given for healthy eating and exercise recommendations.   Did advise pt that I will prescribe zepbound 2.5mg . If this is not approved pt was advised I will have to refer her to weight loss clinic where they may be able to provide alternative options.   Discussed with patient various weight loss medications we could try to apply for with insurance.  Also advised her this may not be approved, and we can try for prior auth and it still may not be approved.  If not approved, we will need to refer to weight loss medical clinic.  If approved, advised pt to f/u within three months after starting to document weight loss and also document how pt is tolerating medication.   The beneficiary does not have any FDA labeled contraindications to the requested agent including pregnancy, lactation, h/o medullary thyroid cancer or multiple endocrine neoplasia type II.    Orders: -     Wegovy; After four weeks of 0.25 mg qweek, increase to 0.5 mg qweek  Dispense: 4 each; Refill: 2 -     Wegovy; Start 0.25 mg qweek for four weeks, then increase to 0.5 mg dose qweek   Dispense: 4 each; Refill: 0 -     Hemoglobin A1c  At risk for diabetes mellitus -     Wegovy; After four weeks of 0.25 mg qweek, increase to 0.5 mg qweek  Dispense: 4 each; Refill: 2 -     Wegovy; Start 0.25 mg qweek for four weeks, then increase to 0.5 mg dose qweek  Dispense: 4 each; Refill: 0 -     Hemoglobin A1c  Food sensitivity with gastrointestinal symptoms Assessment & Plan: Recommendation to continue with gluten free as seems to exacerbate symptoms.     Pedal edema Assessment & Plan: Improving with d/c of amlodipine and start of hydrochlorothiazide       Return in about 3 months (around 04/28/2023) for f/u weight loss medication.  Mort Sawyers, MSN, APRN, FNP-C  [facility]

## 2023-01-28 ENCOUNTER — Other Ambulatory Visit: Payer: Self-pay | Admitting: Family

## 2023-01-28 DIAGNOSIS — D582 Other hemoglobinopathies: Secondary | ICD-10-CM

## 2023-01-28 LAB — LACTATE DEHYDROGENASE: LDH: 172 U/L (ref 120–250)

## 2023-01-29 ENCOUNTER — Ambulatory Visit (INDEPENDENT_AMBULATORY_CARE_PROVIDER_SITE_OTHER)
Admission: RE | Admit: 2023-01-29 | Discharge: 2023-01-29 | Disposition: A | Discharge status: Home / Self Care | Payer: BC Managed Care – PPO | Source: Ambulatory Visit | Attending: Family | Admitting: Family

## 2023-01-29 DIAGNOSIS — R61 Generalized hyperhidrosis: Secondary | ICD-10-CM | POA: Diagnosis not present

## 2023-01-29 DIAGNOSIS — R0609 Other forms of dyspnea: Secondary | ICD-10-CM

## 2023-01-29 DIAGNOSIS — L298 Other pruritus: Secondary | ICD-10-CM

## 2023-01-29 DIAGNOSIS — L509 Urticaria, unspecified: Secondary | ICD-10-CM | POA: Diagnosis not present

## 2023-02-02 ENCOUNTER — Telehealth: Payer: Self-pay

## 2023-02-02 NOTE — Telephone Encounter (Signed)
Pharmacy Patient Advocate Encounter   Received notification from Physician's Office that prior authorization for [facility] 0.25MG /0.5ML auto-injectors is required/requested.   Insurance verification completed.   The patient is insured through [facility] .   Per test claim: PA required; PA submitted to [employer] via CoverMyMeds Key/confirmation #/EOC [id number] Status is pending

## 2023-02-02 NOTE — Telephone Encounter (Signed)
Pt requesting call back to schedule colonoscopy.

## 2023-02-02 NOTE — Telephone Encounter (Signed)
-----   Message from Octavia sent at 01/27/2023 11:51 AM EDT ----- Regarding: wegovy Can we start prior auth for wegovy

## 2023-02-02 NOTE — Telephone Encounter (Signed)
PT returned call

## 2023-02-04 ENCOUNTER — Encounter: Payer: Self-pay | Admitting: Family

## 2023-02-04 NOTE — Telephone Encounter (Signed)
Patient called in to schedule her colonoscopy.

## 2023-02-05 ENCOUNTER — Telehealth: Payer: Self-pay

## 2023-02-05 ENCOUNTER — Other Ambulatory Visit (HOSPITAL_COMMUNITY): Payer: Self-pay

## 2023-02-05 ENCOUNTER — Other Ambulatory Visit: Payer: Self-pay

## 2023-02-05 DIAGNOSIS — Z86010 Personal history of colonic polyps: Secondary | ICD-10-CM

## 2023-02-05 MED ORDER — NA SULFATE-K SULFATE-MG SULF 17.5-3.13-1.6 GM/177ML PO SOLN
1.0000 | Freq: Once | ORAL | 0 refills | Status: AC
Start: 2023-02-05 — End: 2023-02-05

## 2023-02-05 NOTE — Telephone Encounter (Signed)
Gastroenterology Pre-Procedure Review  Request Date: 03/25/23 Requesting Physician: Dr. Servando Snare  PATIENT REVIEW QUESTIONS: The patient responded to the following health history questions as indicated:    1. Are you having any GI issues? no 2. Do you have a personal history of Polyps? yes (last colonoscopy performed by Dr. Servando Snare 10/14/15) 3. Do you have a family history of Colon Cancer or Polyps? no 4. Diabetes Mellitus? no 5. Joint replacements in the past 12 months?no 6. Major health problems in the past 3 months?no 7. Any artificial heart valves, MVP, or defibrillator?no    MEDICATIONS & ALLERGIES:    Patient reports the following regarding taking any anticoagulation/antiplatelet therapy:   Plavix, Coumadin, Eliquis, Xarelto, Lovenox, Pradaxa, Brilinta, or Effient? no Aspirin? no  Patient confirms/reports the following medications:  Current Outpatient Medications  Medication Sig Dispense Refill   Dupilumab (DUPIXENT) 300 MG/2ML SOPN Inject 300 mg into the skin every 14 (fourteen) days. Starting at day 15 for maintenance. 4 mL 5   fexofenadine (ALLEGRA) 60 MG tablet Take 60 mg by mouth 2 (two) times daily.     furosemide (LASIX) 20 MG tablet TAKE ONE TABLET BY MOUTH AS NEEDED FOR EDEMA 30 tablet 0   hydrochlorothiazide (HYDRODIURIL) 25 MG tablet Take 1 tablet (25 mg total) by mouth daily. 90 tablet 3   hydrOXYzine (ATARAX) 25 MG tablet Take 1 tablet (25 mg total) by mouth at bedtime as needed. 30 tablet 5   ketoconazole (NIZORAL) 2 % cream Apply to body folds once to twice daily. 60 g 5   Na Sulfate-K Sulfate-Mg Sulf 17.5-3.13-1.6 GM/177ML SOLN Take 1 kit by mouth once for 1 dose. 354 mL 0   omeprazole (PRILOSEC) 20 MG capsule Take 1 capsule (20 mg total) by mouth daily. 30 capsule 3   Semaglutide-Weight Management (WEGOVY) 0.25 MG/0.5ML SOAJ Start 0.25 mg qweek for four weeks, then increase to 0.5 mg dose qweek (Patient not taking: Reported on 02/05/2023) 4 each 0   Semaglutide-Weight  Management (WEGOVY) 0.5 MG/0.5ML SOAJ After four weeks of 0.25 mg qweek, increase to 0.5 mg qweek (Patient not taking: Reported on 02/05/2023) 4 each 2   TIROSINT-SOL 100 MCG/ML SOLN TAKE BY MOUTH DAILY 90 mL 0   valsartan (DIOVAN) 160 MG tablet Take 1 tablet (160 mg total) by mouth daily. 90 tablet 3   No current facility-administered medications for this visit.    Patient confirms/reports the following allergies:  Allergies  Allergen Reactions   Mobic [Meloxicam] Swelling    In hands and ankles and under eyes.   Polytrim [Polymyxin B-Trimethoprim] Itching    Itching of eyes after use. Patient allergic to sulfa   Azithromycin Rash   Erythromycin     Rash, GI upset   Influenza Vac Split Quad Hives    Possible allergy- rash after injection Still takes yearly flu vaccine   Shrimp [Shellfish Allergy] Other (See Comments)    Headache when eaten in excess   Sulfa Antibiotics Other (See Comments)    Unknown, can not remember   Synthroid [Levothyroxine] Hives    Brand and generic     No orders of the defined types were placed in this encounter.   AUTHORIZATION INFORMATION Primary Insurance: 1D#: Group #:  Secondary Insurance: 1D#: Group #:  SCHEDULE INFORMATION: Date: 03/25/23 Time: Location: [facility]

## 2023-02-05 NOTE — Telephone Encounter (Signed)
Pharmacy Patient Advocate Encounter  Received notification from [name] that Prior Authorization for Wegovy 0.25MG /0.5ML has been APPROVED from 02/02/23 to 06/08/23. Ran test claim, Copay is $70. This test claim was processed through [facility]- copay amounts may vary at other pharmacies due to pharmacy/plan contracts, or as the patient moves through the different stages of their insurance plan.   PA #/Case ID/Reference #: [id number]

## 2023-02-12 ENCOUNTER — Other Ambulatory Visit: Payer: Self-pay | Admitting: Family

## 2023-02-12 DIAGNOSIS — E039 Hypothyroidism, unspecified: Secondary | ICD-10-CM

## 2023-02-16 NOTE — Telephone Encounter (Signed)
Pt called back checking status of PA for [facility]. Told pt it was approved. Pt asked could rx be sent in for  [facility] 0.25MG /0.5ML? Call back # [phone]

## 2023-03-02 ENCOUNTER — Ambulatory Visit: Payer: BC Managed Care – PPO | Admitting: Dermatology

## 2023-03-02 ENCOUNTER — Encounter: Payer: Self-pay | Admitting: Dermatology

## 2023-03-02 DIAGNOSIS — L820 Inflamed seborrheic keratosis: Secondary | ICD-10-CM | POA: Diagnosis not present

## 2023-03-02 DIAGNOSIS — L570 Actinic keratosis: Secondary | ICD-10-CM | POA: Diagnosis not present

## 2023-03-02 DIAGNOSIS — L209 Atopic dermatitis, unspecified: Secondary | ICD-10-CM

## 2023-03-02 DIAGNOSIS — W908XXA Exposure to other nonionizing radiation, initial encounter: Secondary | ICD-10-CM

## 2023-03-02 MED ORDER — OPZELURA 1.5 % EX CREA: TOPICAL_CREAM | CUTANEOUS | 2 refills | Status: DC

## 2023-03-02 MED ORDER — FLUCONAZOLE 150 MG PO TABS: 150.0000 mg | ORAL_TABLET | Freq: Every day | ORAL | 0 refills | Status: DC

## 2023-03-02 NOTE — Patient Instructions (Signed)
Cryotherapy Aftercare  Wash gently with soap and water everyday.   Apply Vaseline and Band-Aid daily until healed.    Your prescription was sent to [facility] in [place]. A representative from [facility] will contact you within 3 business hours to verify your address and insurance information to schedule a free delivery. If for any reason you do not receive a phone call from them, please reach out to them. Their phone number is [phone] and their hours are Monday-Friday 9:00 am-5:00 pm.   Due to recent changes in healthcare laws, you may see results of your pathology and/or laboratory studies on MyChart before the doctors have had a chance to review them. We understand that in some cases there may be results that are confusing or concerning to you. Please understand that not all results are received at the same time and often the doctors may need to interpret multiple results in order to provide you with the best plan of care or course of treatment. Therefore, we ask that you please give [place] 2 business days to thoroughly review all your results before contacting the office for clarification. Should we see a critical lab result, you will be contacted sooner.   If You Need Anything After Your Visit  If you have any questions or concerns for your doctor, please call our main line at [phone] and press option 4 to reach your doctor's medical assistant. If no one answers, please leave a voicemail as directed and we will return your call as soon as possible. Messages left after 4 pm will be answered the following business day.   You may also send [place] a message via MyChart. We typically respond to MyChart messages within 1-2 business days.  For prescription refills, please ask your pharmacy to contact our office. Our fax number is [phone].  If you have an urgent issue when the clinic is closed that cannot wait until the next business day, you can page your doctor at the number  below.    Please note that while we do our best to be available for urgent issues outside of office hours, we are not available 24/7.   If you have an urgent issue and are unable to reach [place], you may choose to seek medical care at your doctor's office, retail clinic, urgent care center, or emergency room.  If you have a medical emergency, please immediately call 911 or go to the emergency department.  Pager Numbers  - Dr. Gwen Pounds: [phone]  - Dr. Roseanne Reno: [phone]  - Dr. Katrinka Blazing: [phone]   In the event of inclement weather, please call our main line at [phone] for an update on the status of any delays or closures.  Dermatology Medication Tips: Please keep the boxes that topical medications come in in order to help keep track of the instructions about where and how to use these. Pharmacies typically print the medication instructions only on the boxes and not directly on the medication tubes.   If your medication is too expensive, please contact our office at [phone] option 4 or send [place] a message through MyChart.   We are unable to tell what your co-pay for medications will be in advance as this is different depending on your insurance coverage. However, we may be able to find a substitute medication at lower cost or fill out paperwork to get insurance to cover a needed medication.   If a prior authorization is required to get your medication covered by your insurance company, please  allow [place] 1-2 business days to complete this process.  Drug prices often vary depending on where the prescription is filled and some pharmacies may offer cheaper prices.  The website www.goodrx.com contains coupons for medications through different pharmacies. The prices here do not account for what the cost may be with help from insurance (it may be cheaper with your insurance), but the website can give you the price if you did not use any insurance.  - You can print the associated  coupon and take it with your prescription to the pharmacy.  - You may also stop by our office during regular business hours and pick up a GoodRx coupon card.  - If you need your prescription sent electronically to a different pharmacy, notify our office through [facility] or by phone at [phone] option 4.

## 2023-03-02 NOTE — Progress Notes (Signed)
Follow-Up Visit   Subjective  Bridget Reed is a 62 y.o. female who presents for the following: dermatitis, 6 month follow up. Patient was on Dupixent which helped with itch, but has discontinued about 7 weeks ago due to side effects, mainly yeast infections. She also had dry eye with vision changes, SOB and joint pain. Patient had Diflucan at home and took 1 about 5 weeks ago and itching went away and skin cleared up. Also uses ketoconazole cream.  Patient feels that yeast is the problem and is what causes her itching and her skin to break out.   The patient has spots, moles and lesions to be evaluated, some may be new or changing and the patient may have concern these could be cancer. Scaly spot on nose to check and irritated spots on chest that she picks at.   The following portions of the chart were reviewed this encounter and updated as appropriate: medications, allergies, medical history  Review of Systems:  No other skin or systemic complaints except as noted in HPI or Assessment and Plan.  Objective  Well appearing patient in no apparent distress; mood and affect are within normal limits.   A focused examination was performed of the following areas: Arms, legs  Relevant exam findings are noted in the Assessment and Plan.  nasal dorsum x 1 Erythematous thin papules/macules with gritty scale.   left chest x 2 (2) Erythematous stuck-on, waxy papule or plaque    Assessment & Plan   ATOPIC DERMATITIS Exam: Mild erythema with resolving small pink papules at arms  Chronic and persistent condition with duration or expected duration over one year. Condition is symptomatic / bothersome to patient. Improved but not to goal.  Atopic dermatitis (eczema) is a chronic, relapsing, pruritic condition that can significantly affect quality of life. It is often associated with allergic rhinitis and/or asthma and can require treatment with topical medications, phototherapy, or in  severe cases biologic injectable medication (Dupixent; Adbry) or Oral JAK inhibitors.    Treatment Plan: D/C Dupixent due to side effects Start Opzelura twice daily to affected areas as needed for itch/rash. Will send in Diflucan 150 mg x 7 for patient to take for 1 dose as needed for yeast infection.   Side effects of fluconazole (diflucan) include nausea, diarrhea, headache, dizziness, taste changes, rare risk of irritation of the liver, allergy, or decreased blood counts (which could show up as infection or tiredness).    AK (actinic keratosis) nasal dorsum x 1  Actinic keratoses are precancerous spots that appear secondary to cumulative UV radiation exposure/sun exposure over time. They are chronic with expected duration over 1 year. A portion of actinic keratoses will progress to squamous cell carcinoma of the skin. It is not possible to reliably predict which spots will progress to skin cancer and so treatment is recommended to prevent development of skin cancer.  Recommend daily broad spectrum sunscreen SPF 30+ to sun-exposed areas, reapply every 2 hours as needed.  Recommend staying in the shade or wearing long sleeves, sun glasses (UVA+UVB protection) and wide brim hats (4-inch brim around the entire circumference of the hat). Call for new or changing lesions.  Destruction of lesion - nasal dorsum x 1  Destruction method: cryotherapy   Informed consent: discussed and consent obtained   Lesion destroyed using liquid nitrogen: Yes   Region frozen until ice ball extended beyond lesion: Yes   Outcome: patient tolerated procedure well with no complications   Post-procedure details: wound  care instructions given   Additional details:  Prior to procedure, discussed risks of blister formation, small wound, skin dyspigmentation, or rare scar following cryotherapy. Recommend Vaseline ointment to treated areas while healing.   Inflamed seborrheic keratosis (2) left chest x  2  Symptomatic, irritating, patient would like treated.  Benign-appearing.  Call clinic for new or changing lesions.    Destruction of lesion - left chest x 2 (2)  Destruction method: cryotherapy   Informed consent: discussed and consent obtained   Lesion destroyed using liquid nitrogen: Yes   Region frozen until ice ball extended beyond lesion: Yes   Outcome: patient tolerated procedure well with no complications   Post-procedure details: wound care instructions given   Additional details:  Prior to procedure, discussed risks of blister formation, small wound, skin dyspigmentation, or rare scar following cryotherapy. Recommend Vaseline ointment to treated areas while healing.     Return in about 6 months (around 08/31/2023) for Dermatitis, SK follow up, with Dr. Roseanne Reno, Hx BCC.  Anise Salvo, RMA, am acting as scribe for Willeen Niece, MD .   Documentation: I have reviewed the above documentation for accuracy and completeness, and I agree with the above.  Willeen Niece, MD

## 2023-03-18 ENCOUNTER — Encounter: Payer: Self-pay | Admitting: Gastroenterology

## 2023-03-25 ENCOUNTER — Ambulatory Visit: Payer: BC Managed Care – PPO | Admitting: Anesthesiology

## 2023-03-25 ENCOUNTER — Ambulatory Visit
Admission: RE | Admit: 2023-03-25 | Discharge: 2023-03-25 | Disposition: A | Discharge status: Home / Self Care | Payer: BC Managed Care – PPO | Attending: Gastroenterology | Admitting: Gastroenterology

## 2023-03-25 ENCOUNTER — Encounter: Payer: Self-pay | Admitting: Gastroenterology

## 2023-03-25 ENCOUNTER — Encounter
Admission: RE | Disposition: A | Discharge status: Home / Self Care | Payer: Self-pay | Source: Home / Self Care | Attending: Gastroenterology

## 2023-03-25 DIAGNOSIS — K641 Second degree hemorrhoids: Secondary | ICD-10-CM | POA: Diagnosis not present

## 2023-03-25 DIAGNOSIS — Z79899 Other long term (current) drug therapy: Secondary | ICD-10-CM | POA: Insufficient documentation

## 2023-03-25 DIAGNOSIS — K635 Polyp of colon: Secondary | ICD-10-CM

## 2023-03-25 DIAGNOSIS — I10 Essential (primary) hypertension: Secondary | ICD-10-CM | POA: Diagnosis not present

## 2023-03-25 DIAGNOSIS — K219 Gastro-esophageal reflux disease without esophagitis: Secondary | ICD-10-CM | POA: Diagnosis not present

## 2023-03-25 DIAGNOSIS — Z1211 Encounter for screening for malignant neoplasm of colon: Secondary | ICD-10-CM | POA: Insufficient documentation

## 2023-03-25 DIAGNOSIS — Z6841 Body Mass Index (BMI) 40.0 and over, adult: Secondary | ICD-10-CM | POA: Insufficient documentation

## 2023-03-25 DIAGNOSIS — Z860100 Personal history of colon polyps, unspecified: Secondary | ICD-10-CM | POA: Diagnosis not present

## 2023-03-25 DIAGNOSIS — E039 Hypothyroidism, unspecified: Secondary | ICD-10-CM | POA: Insufficient documentation

## 2023-03-25 DIAGNOSIS — Z85828 Personal history of other malignant neoplasm of skin: Secondary | ICD-10-CM | POA: Diagnosis not present

## 2023-03-25 HISTORY — PX: COLONOSCOPY WITH PROPOFOL: SHX5780

## 2023-03-25 HISTORY — PX: POLYPECTOMY: SHX5525

## 2023-03-25 SURGERY — COLONOSCOPY WITH PROPOFOL
Anesthesia: General

## 2023-03-25 MED ORDER — PROPOFOL 500 MG/50ML IV EMUL
INTRAVENOUS | Status: DC | PRN
  Administered 2023-03-25: 140 ug/kg/min via INTRAVENOUS

## 2023-03-25 MED ORDER — SODIUM CHLORIDE 0.9 % IV SOLN
INTRAVENOUS | Status: DC
Start: 2023-03-25 — End: 2023-03-25

## 2023-03-25 MED ORDER — PROPOFOL 10 MG/ML IV BOLUS
INTRAVENOUS | Status: DC | PRN
  Administered 2023-03-25: 20 mg via INTRAVENOUS
  Administered 2023-03-25: 80 mg via INTRAVENOUS

## 2023-03-25 NOTE — Transfer of Care (Signed)
Immediate Anesthesia Transfer of Care Note  Patient: Bridget Reed  Procedure(s) Performed: COLONOSCOPY WITH PROPOFOL  Patient Location: PACU  Anesthesia Type:General  Level of Consciousness: awake, alert , and oriented  Airway & Oxygen Therapy: Patient Spontanous Breathing  Post-op Assessment: Report given to RN and Post -op Vital signs reviewed and stable  Post vital signs: Reviewed and stable  Last Vitals:  Vitals Value Taken Time  BP 131/44 03/25/23 0805  Temp 36.2 C 03/25/23 0804  Pulse 99 03/25/23 0806  Resp 32 03/25/23 0806  SpO2 98 % 03/25/23 0806  Vitals shown include unfiled device data.  Last Pain:  Vitals:   03/25/23 0804  TempSrc: Temporal  PainSc: 0-No pain         Complications: No notable events documented.

## 2023-03-25 NOTE — Brief Op Note (Signed)
Transverse colon polyp cold snare not retrieved

## 2023-03-25 NOTE — Anesthesia Postprocedure Evaluation (Signed)
Anesthesia Post Note  Patient: Bridget Reed  Procedure(s) Performed: COLONOSCOPY WITH PROPOFOL  Patient location during evaluation: PACU Anesthesia Type: General Level of consciousness: awake Pain management: pain level controlled Vital Signs Assessment: post-procedure vital signs reviewed and stable Respiratory status: spontaneous breathing Cardiovascular status: stable Anesthetic complications: no   No notable events documented.   Last Vitals:  Vitals:   03/25/23 0804 03/25/23 0824  BP: (!) 131/44 (!) 164/76  Pulse: 92   Resp: 13   Temp: (!) 36.2 C   SpO2: 98%     Last Pain:  Vitals:   03/25/23 0824  TempSrc:   PainSc: 0-No pain                 VAN STAVEREN,Solan Vosler

## 2023-03-25 NOTE — Anesthesia Preprocedure Evaluation (Signed)
Anesthesia Evaluation  Patient identified by MRN, date of birth, ID band Patient awake    Reviewed: Allergy & Precautions, NPO status , Patient's Chart, lab work & pertinent test results  Airway Mallampati: III  TM Distance: >3 FB Neck ROM: Full    Dental  (+) Teeth Intact   Pulmonary neg pulmonary ROS   Pulmonary exam normal breath sounds clear to auscultation       Cardiovascular Exercise Tolerance: Good hypertension, Pt. on medications negative cardio ROS Normal cardiovascular exam Rhythm:Regular Rate:Normal     Neuro/Psych negative neurological ROS  negative psych ROS   GI/Hepatic negative GI ROS, Neg liver ROS,GERD  Medicated,,  Endo/Other  negative endocrine ROSHypothyroidism  Morbid obesity  Renal/GU negative Renal ROS  negative genitourinary   Musculoskeletal   Abdominal  (+) + obese  Peds negative pediatric ROS (+)  Hematology negative hematology ROS (+)   Anesthesia Other Findings Past Medical History: No date: Allergy 06/17/2017: Basal cell carcinoma     Comment:  right chest  No date: GERD (gastroesophageal reflux disease) No date: Hypertension No date: Left tennis elbow 07/13/2016: Morbid obesity (HCC)  Past Surgical History: No date: COLONOSCOPY 10/14/2015: COLONOSCOPY WITH PROPOFOL; N/A     Comment:  Procedure: COLONOSCOPY WITH PROPOFOL;  Surgeon: Midge Minium, MD;  Location: [facility];  Service:               Endoscopy;  Laterality: N/A; No date: NO PAST SURGERIES  BMI    Body Mass Index: 46.82 kg/m      Reproductive/Obstetrics negative OB ROS                             Anesthesia Physical Anesthesia Plan  ASA: 2  Anesthesia Plan: General   Post-op Pain Management:    Induction: Intravenous  PONV Risk Score and Plan: Propofol infusion and TIVA  Airway Management Planned: Natural Airway and Nasal Cannula  Additional  Equipment:   Intra-op Plan:   Post-operative Plan:   Informed Consent: I have reviewed the patients History and Physical, chart, labs and discussed the procedure including the risks, benefits and alternatives for the proposed anesthesia with the patient or authorized representative who has indicated his/her understanding and acceptance.     Dental Advisory Given  Plan Discussed with: CRNA and Surgeon  Anesthesia Plan Comments:        Anesthesia Quick Evaluation

## 2023-03-25 NOTE — H&P (Signed)
Midge Minium, MD [facility] [address] [place], [place] [address] Phone:[phone] Fax : [phone]  Primary Care Physician:  Mort Sawyers, FNP Primary Gastroenterologist:  Dr. Servando Snare  Pre-Procedure History & Physical: HPI:  Bridget Reed is a 62 y.o. female is here for an colonoscopy.   Past Medical History:  Diagnosis Date   Allergy    Basal cell carcinoma 06/17/2017   right chest    GERD (gastroesophageal reflux disease)    Hypertension    Left tennis elbow    Morbid obesity (HCC) 07/13/2016    Past Surgical History:  Procedure Laterality Date   COLONOSCOPY     COLONOSCOPY WITH PROPOFOL N/A 10/14/2015   Procedure: COLONOSCOPY WITH PROPOFOL;  Surgeon: Midge Minium, MD;  Location: [facility];  Service: Endoscopy;  Laterality: N/A;   NO PAST SURGERIES      Prior to Admission medications   Medication Sig Start Date End Date Taking? Authorizing Provider  hydrochlorothiazide (HYDRODIURIL) 25 MG tablet Take 1 tablet (25 mg total) by mouth daily. 09/23/22  Yes Dugal, Wyatt Mage, FNP  valsartan (DIOVAN) 160 MG tablet Take 1 tablet (160 mg total) by mouth daily. 09/23/22  Yes Dugal, Tabitha, FNP  Dupilumab (DUPIXENT) 300 MG/2ML SOPN Inject 300 mg into the skin every 14 (fourteen) days. Starting at day 15 for maintenance. Patient not taking: Reported on 03/02/2023 08/27/22   Willeen Niece, MD  fexofenadine (ALLEGRA) 60 MG tablet Take 60 mg by mouth 2 (two) times daily.    [provider]  fluconazole (DIFLUCAN) 150 MG tablet Take 1 tablet (150 mg total) by mouth daily. 03/02/23   Willeen Niece, MD  furosemide (LASIX) 20 MG tablet TAKE ONE TABLET BY MOUTH AS NEEDED FOR EDEMA 11/06/22   Mort Sawyers, FNP  hydrOXYzine (ATARAX) 25 MG tablet Take 1 tablet (25 mg total) by mouth at bedtime as needed. 03/16/22   Birder Robson, MD  ketoconazole (NIZORAL) 2 % cream Apply to body folds once to twice daily. 07/29/22   Willeen Niece, MD  Levothyroxine Sodium (TIROSINT-SOL)  100 MCG/ML SOLN TAKE BY MOUTH DAILY 02/16/23   Mort Sawyers, FNP  omeprazole (PRILOSEC) 20 MG capsule Take 1 capsule (20 mg total) by mouth daily. 01/27/23   Mort Sawyers, FNP  Ruxolitinib Phosphate (OPZELURA) 1.5 % CREA Apply to affected areas twice daily as needed for itch. 03/02/23   Willeen Niece, MD  Semaglutide-Weight Management [facility]) 0.25 MG/0.5ML [place] Start 0.25 mg qweek for four weeks, then increase to 0.5 mg dose qweek 01/27/23   Mort Sawyers, FNP  Semaglutide-Weight Management (WEGOVY) 0.5 MG/0.5ML SOAJ After four weeks of 0.25 mg qweek, increase to 0.5 mg qweek Patient not taking: Reported on 02/05/2023 01/27/23   Mort Sawyers, FNP    Allergies as of 02/05/2023 - Review Complete 02/05/2023  Allergen Reaction Noted   Mobic [meloxicam] Swelling 07/30/2016   Polytrim [polymyxin b-trimethoprim] Itching 10/29/2020   Azithromycin Rash 07/16/2015   Erythromycin  09/19/2010   Influenza vac split quad Hives 06/12/2011   Shrimp [shellfish allergy] Other (See Comments) 06/29/2017   Sulfa antibiotics Other (See Comments) 09/23/2022   Synthroid [levothyroxine] Hives 04/28/2022    Family History  Problem Relation Age of Onset   Heart failure Mother    Pancreatic cancer Mother    Osteopenia Mother    Arthritis Mother    Kidney cancer Father    Heart disease Father    Hypertension Father    Hypertension Brother    Atrial fibrillation Brother  Heart disease Maternal Grandmother    Leukemia Maternal Grandfather    Breast cancer Paternal Aunt 29   Colon cancer Paternal Aunt     Social History   Socioeconomic History   Marital status: Married    Spouse name: Not on file   Number of children: 0   Years of education: Not on file   Highest education level: Some college, no degree  Occupational History    Employer: [employer]  Tobacco Use   Smoking status: Never   Smokeless tobacco: Never  Vaping Use   Vaping status: Never Used  Substance and Sexual Activity    Alcohol use: Yes    Alcohol/week: 9.0 standard drinks of alcohol    Types: 9 Glasses of wine per week    Comment: 3-4 glasses of wine about three days a week   Drug use: No   Sexual activity: Yes    Partners: Male    Birth control/protection: Post-menopausal  Other Topics Concern   Not on file  Social History Narrative   Diet: moderate.. Working on improving .   Walks 3-4 times a week.    No kids 1 step daughter married    Works Stage manager   Social Determinants of [employer]: Low Risk  (01/26/2023)   Overall Financial Resource Strain (CARDIA)    Difficulty of Paying Living Expenses: Not hard at all  Food Insecurity: No Food Insecurity (01/26/2023)   Hunger Vital Sign    Worried About Running Out of Food in the Last Year: Never true    Ran Out of Food in the Last Year: Never true  Transportation Needs: No Transportation Needs (01/26/2023)   PRAPARE - Administrator, Civil Service (Medical): No    Lack of Transportation (Non-Medical): No  Physical Activity: Insufficiently Active (01/26/2023)   Exercise Vital Sign    Days of Exercise per Week: 1 day    Minutes of Exercise per Session: 20 min  Stress: No Stress Concern Present (01/26/2023)   [employer] of Occupational Health - Occupational Stress Questionnaire    Feeling of Stress : Not at all  Social Connections: Moderately Isolated (01/26/2023)   Social Connection and Isolation Panel [NHANES]    Frequency of Communication with Friends and Family: More than three times a week    Frequency of Social Gatherings with Friends and Family: Once a week    Attends Religious Services: Never    Database administrator or Organizations: No    Attends Engineer, structural: Not on file    Marital Status: Married  Catering manager Violence: Not on file    Review of Systems: See HPI, otherwise negative ROS  Physical Exam: BP (!) 216/61   Pulse 97   Temp (!) 96.7 F (35.9 C) (Axillary)   Resp 18    Ht 5\' 2"  (1.575 m)   Wt 116.1 kg   LMP 11/22/2013 (Approximate)   SpO2 98%   BMI 46.82 kg/m  General:   Alert,  pleasant and cooperative in NAD Head:  Normocephalic and atraumatic. Neck:  Supple; no masses or thyromegaly. Lungs:  Clear throughout to auscultation.    Heart:  Regular rate and rhythm. Abdomen:  Soft, nontender and nondistended. Normal bowel sounds, without guarding, and without rebound.   Neurologic:  Alert and  oriented x4;  grossly normal neurologically.  Impression/Plan: Bridget Reed is here for an colonoscopy to be performed for a history of adenomatous polyps on 2017  Risks, benefits, limitations, and alternatives regarding  colonoscopy have been reviewed with the patient.  Questions have been answered.  All parties agreeable.   Midge Minium, MD  03/25/2023, 7:31 AM

## 2023-03-25 NOTE — Op Note (Signed)
[facility] Patient Name: Bridget Reed Procedure Date: 03/25/2023 7:38 AM MRN: [id number] Account #: [id number] Date of Birth: July 07, 1960 Admit Type: Outpatient Age: 62 Room: [facility] ENDO ROOM 4 Gender: Female Note Status: Finalized Instrument Name: Prentice Docker [id number] Procedure:             Colonoscopy Indications:           High risk colon cancer surveillance: Personal history                         of colonic polyps Providers:             Midge Minium MD, MD Referring MD:          Midge Minium MD, MD (Referring MD), Mort Sawyers                         (Referring MD) Medicines:             Propofol per Anesthesia Complications:         No immediate complications. Procedure:             Pre-Anesthesia Assessment:                        - Prior to the procedure, a History and Physical was                         performed, and patient medications and allergies were                         reviewed. The patient's tolerance of previous                         anesthesia was also reviewed. The risks and benefits                         of the procedure and the sedation options and risks                         were discussed with the patient. All questions were                         answered, and informed consent was obtained. Prior                         Anticoagulants: The patient has taken no anticoagulant                         or antiplatelet agents. ASA Grade Assessment: II - A                         patient with mild systemic disease. After reviewing                         the risks and benefits, the patient was deemed in                         satisfactory condition to undergo the procedure.  After obtaining informed consent, the colonoscope was                         passed under direct vision. Throughout the procedure,                         the patient's blood pressure, pulse, and oxygen                          saturations were monitored continuously. The                         Colonoscope was introduced through the anus and                         advanced to the the cecum, identified by appendiceal                         orifice and ileocecal valve. The colonoscopy was                         performed without difficulty. The patient tolerated                         the procedure well. The quality of the bowel                         preparation was excellent. Findings:      The perianal and digital rectal examinations were normal.      A 6 mm polyp was found in the transverse colon. The polyp was sessile.       The polyp was removed with a cold snare. Resection was complete, but the       polyp tissue was not retrieved.      Non-bleeding internal hemorrhoids were found during retroflexion. The       hemorrhoids were Grade II (internal hemorrhoids that prolapse but reduce       spontaneously). Impression:            - One 6 mm polyp in the transverse colon, removed with                         a cold snare. Complete resection. Polyp tissue not                         retrieved.                        - Non-bleeding internal hemorrhoids. Recommendation:        - Discharge patient to home.                        - Resume previous diet.                        - Continue present medications.                        - Await pathology results.                        -  Repeat colonoscopy in 7 years for surveillance. Procedure Code(s):     --- Professional ---                        [phone], Colonoscopy, flexible; with removal of                         tumor(s), polyp(s), or other lesion(s) by snare                         technique Diagnosis Code(s):     --- Professional ---                        Z86.010, Personal history of colonic polyps                        D12.3, Benign neoplasm of transverse colon (hepatic                         flexure or splenic flexure) CPT copyright 2022 American  Medical Association. All rights reserved. The codes documented in this report are preliminary and upon coder review may  be revised to meet current compliance requirements. Midge Minium MD, MD 03/25/2023 8:03:11 AM This report has been signed electronically. Number of Addenda: 0 Note Initiated On: 03/25/2023 7:38 AM Scope Withdrawal Time: 0 hours 11 minutes 54 seconds  Total Procedure Duration: 0 hours 15 minutes 45 seconds  Estimated Blood Loss:  Estimated blood loss: none.      [facility]

## 2023-04-06 ENCOUNTER — Other Ambulatory Visit: Payer: Self-pay | Admitting: Family

## 2023-04-06 DIAGNOSIS — Z1231 Encounter for screening mammogram for malignant neoplasm of breast: Secondary | ICD-10-CM

## 2023-04-12 ENCOUNTER — Telehealth: Payer: Self-pay | Admitting: Family

## 2023-04-12 DIAGNOSIS — Z9189 Other specified personal risk factors, not elsewhere classified: Secondary | ICD-10-CM

## 2023-04-12 MED ORDER — WEGOVY 0.5 MG/0.5ML ~~LOC~~ SOAJ: 0.5000 mg | SUBCUTANEOUS | 0 refills | Status: DC

## 2023-04-12 NOTE — Telephone Encounter (Signed)
Patient would like to have Semaglutide-Weight Management (WEGOVY) 0.5 MG/0.5ML SOAJ sent to  [facility] - [place], [place] - [address] Phone: [phone]  Fax: [phone]     She stated that [facility],and she have been having a hard time getting the wegovy.However [facility] and [facility] do have it in stock as of now.

## 2023-04-12 NOTE — Telephone Encounter (Signed)
Spoke with pt and she is needing her Wegovy 0.5mg  sent to [facility]. The original prescription was for #4 with 2 refills, I have sent in #12 to [facility]. Nothing further was needed.

## 2023-04-16 ENCOUNTER — Ambulatory Visit
Admission: RE | Admit: 2023-04-16 | Discharge: 2023-04-16 | Disposition: A | Discharge status: Home / Self Care | Payer: BC Managed Care – PPO | Source: Ambulatory Visit | Attending: Family | Admitting: Family

## 2023-04-16 DIAGNOSIS — Z1231 Encounter for screening mammogram for malignant neoplasm of breast: Secondary | ICD-10-CM | POA: Insufficient documentation

## 2023-04-20 ENCOUNTER — Other Ambulatory Visit: Payer: Self-pay | Admitting: Family

## 2023-04-20 DIAGNOSIS — K219 Gastro-esophageal reflux disease without esophagitis: Secondary | ICD-10-CM

## 2023-06-13 ENCOUNTER — Other Ambulatory Visit: Payer: Self-pay | Admitting: Dermatology

## 2023-06-15 ENCOUNTER — Telehealth: Payer: Self-pay

## 2023-06-15 NOTE — Telephone Encounter (Signed)
Patient requesting refills of Diflucan, stating she has two left and uses them about every three weeks. Ok to refill?

## 2023-06-16 ENCOUNTER — Other Ambulatory Visit: Payer: Self-pay

## 2023-06-16 MED ORDER — FLUCONAZOLE 150 MG PO TABS: 150.0000 mg | ORAL_TABLET | Freq: Every day | ORAL | 0 refills | Status: DC

## 2023-06-16 NOTE — Telephone Encounter (Signed)
Advised pt refill of Diflucan sent to [facility]sh

## 2023-06-16 NOTE — Progress Notes (Signed)
[facility]

## 2023-06-20 ENCOUNTER — Other Ambulatory Visit: Payer: Self-pay | Admitting: Family

## 2023-06-20 DIAGNOSIS — K219 Gastro-esophageal reflux disease without esophagitis: Secondary | ICD-10-CM

## 2023-06-21 ENCOUNTER — Encounter: Payer: Self-pay | Admitting: Family

## 2023-06-22 MED ORDER — WEGOVY 1 MG/0.5ML ~~LOC~~ SOAJ: 1.0000 mg | SUBCUTANEOUS | 0 refills | Status: DC

## 2023-06-30 ENCOUNTER — Telehealth: Payer: Self-pay | Admitting: Family

## 2023-06-30 MED ORDER — WEGOVY 1 MG/0.5ML ~~LOC~~ SOAJ: 1.0000 mg | SUBCUTANEOUS | 0 refills | Status: DC

## 2023-06-30 NOTE — Telephone Encounter (Signed)
 Spoke with pt. Advised her that the current PA on file for Wegovy  expired on 06/08/23. A new prescription was sent to [facility], her insurance changed mail order pharmacies. Advised her that this would then prompt a new PA and we would send that over to our pharmacy team to process. Pt was appreciative our help and has 2 doses left of her current box so she is not ut of medication. Will route message to our pharmacy team for them to start the new PA.

## 2023-06-30 NOTE — Addendum Note (Signed)
Addended by: Jaynee Eagles C on: 06/30/2023 11:40 AM   Modules accepted: Orders

## 2023-06-30 NOTE — Telephone Encounter (Signed)
 Routing to clinical staff for review/action.  Copied from CRM [id number]. Topic: Clinical - Prescription Issue >> Jun 30, 2023 11:16 AM Mercedes MATSU wrote: Reason for CRM: Patient is requesting a current copy of approval form from [facility] for her medication Wegovy  to be sent over to new pharmacy on file [facility] along with a new copy of the Wegovy  presciption. Patient states if need be, please give her a call at [phone].

## 2023-07-01 ENCOUNTER — Other Ambulatory Visit (HOSPITAL_COMMUNITY): Payer: Self-pay

## 2023-07-01 ENCOUNTER — Telehealth: Payer: Self-pay

## 2023-07-01 MED ORDER — WEGOVY 1 MG/0.5ML ~~LOC~~ SOAJ: 1.0000 mg | SUBCUTANEOUS | 0 refills | Status: DC

## 2023-07-01 NOTE — Telephone Encounter (Signed)
 PA has been approved

## 2023-07-01 NOTE — Addendum Note (Signed)
 Addended by: Dewanda Foots C on: 07/01/2023 04:48 PM   Modules accepted: Orders

## 2023-07-01 NOTE — Telephone Encounter (Signed)
 Pharmacy Patient Advocate Encounter  Received notification from [employer] that Prior Authorization for Wegovy  1MG /0.5ML auto-injectors has been APPROVED from 07/01/2023 to 12/29/2023   PA #/Case ID/Reference #: [id number]

## 2023-07-01 NOTE — Telephone Encounter (Signed)
 Pharmacy Patient Advocate Encounter   Received notification from Pt Calls Messages that prior authorization for Wegovy  1MG /0.5ML auto-injectors is required/requested.   Insurance verification completed.   The patient is insured through [facility] .   Per test claim: PA required; PA submitted to above mentioned insurance via CoverMyMeds Key/confirmation #/EOC [id number] Status is pending

## 2023-07-01 NOTE — Telephone Encounter (Signed)
 PA request has been Submitted. New Encounter created for follow up. For additional info see Pharmacy Prior Auth telephone encounter from 07/01/2023.

## 2023-07-06 ENCOUNTER — Other Ambulatory Visit: Payer: Self-pay | Admitting: *Deleted

## 2023-07-06 DIAGNOSIS — I10 Essential (primary) hypertension: Secondary | ICD-10-CM

## 2023-07-06 DIAGNOSIS — R600 Localized edema: Secondary | ICD-10-CM

## 2023-07-06 MED ORDER — VALSARTAN 160 MG PO TABS: 160.0000 mg | ORAL_TABLET | Freq: Every day | ORAL | 1 refills | Status: DC

## 2023-07-06 MED ORDER — HYDROCHLOROTHIAZIDE 25 MG PO TABS: 25.0000 mg | ORAL_TABLET | Freq: Every day | ORAL | 1 refills | Status: DC

## 2023-07-07 ENCOUNTER — Ambulatory Visit (INDEPENDENT_AMBULATORY_CARE_PROVIDER_SITE_OTHER): Payer: Self-pay | Admitting: Oncology

## 2023-07-07 ENCOUNTER — Encounter: Payer: Self-pay | Admitting: Oncology

## 2023-07-07 ENCOUNTER — Other Ambulatory Visit: Payer: Self-pay

## 2023-07-07 VITALS — BP 170/86 | HR 88 | Temp 97.9°F | Ht 62.0 in | Wt 240.0 lb

## 2023-07-07 DIAGNOSIS — M25552 Pain in left hip: Secondary | ICD-10-CM

## 2023-07-07 MED ORDER — PREDNISONE 20 MG PO TABS: 20.0000 mg | ORAL_TABLET | Freq: Every day | ORAL | 0 refills | Status: DC

## 2023-07-07 NOTE — Progress Notes (Signed)
[facility] [address] [place], [place] [address] Phone: [phone] Fax: [phone]   Office Visit Note  Patient Name: Bridget Reed  Date of [id number]  Med Rec number [id number]  Date of Service: 07/07/2023  Mobic [meloxicam], Polytrim [polymyxin b-trimethoprim], Azithromycin, Erythromycin, Influenza vac split quad, Shrimp [shellfish allergy], Sulfa antibiotics, and Synthroid [levothyroxine]  Chief Complaint  Patient presents with   Hip Pain    Patient fell down the steps several weeks ago and has had L leg weakness ever since. Pain radiates form her L hip down the front of her leg.    HPI Patient is an 63 y.o. student here for complaints of Left hip and leg pain that started several months ago after she fell down stairs landing on her left hip.  Since the fall she has had intermittent left leg pain and overall weakness.  Symptoms have been more or less stable but have slightly worsened over the past 10 days.  States she feels okay to sit but when lying in certain positions and or standing for long periods of time her hip starts to bother her.  She noticed this morning lifting her leg to get into the shower caused discomfort.  Occasionally it will radiate down her left leg into her thigh.  She has been taking 3 ibuprofen or Aleve at bedtime.  Occasionally, she can change positions with improvement of her symptoms but never complete relief.  Reports she has made an appointment with her chiropractor for next Tuesday which she is hoping will help.  She has not been seen by Ortho for this same problem.  States she knows that her weight definitely is playing a part in her pain.  Current Medication:  Outpatient Encounter Medications as of 07/07/2023  Medication Sig   fexofenadine (ALLEGRA) 60 MG tablet Take 60 mg by mouth daily.   fluconazole (DIFLUCAN) 150 MG tablet Take 1 tablet (150 mg total) by mouth daily. (Patient taking differently: Take 150 mg by mouth. Once  every 3 weeks)   furosemide (LASIX) 20 MG tablet TAKE ONE TABLET BY MOUTH AS NEEDED FOR EDEMA   hydrochlorothiazide (HYDRODIURIL) 25 MG tablet Take 1 tablet (25 mg total) by mouth daily.   hydrOXYzine (ATARAX) 25 MG tablet Take 1 tablet (25 mg total) by mouth at bedtime as needed.   ketoconazole (NIZORAL) 2 % cream Apply to body folds once to twice daily. (Patient taking differently: as needed. Apply to body folds once to twice daily.)   Levothyroxine Sodium (TIROSINT-SOL) 100 MCG/ML SOLN TAKE BY MOUTH DAILY   omeprazole (PRILOSEC) 40 MG capsule TAKE 1 CAPSULE (40 MG TOTAL) BY MOUTH DAILY.   Ruxolitinib Phosphate (OPZELURA) 1.5 % CREA Apply to affected areas twice daily as needed for itch.   Semaglutide-Weight Management (WEGOVY) 1 MG/0.5ML SOAJ Inject 1 mg into the skin once a week.   valsartan (DIOVAN) 160 MG tablet Take 1 tablet (160 mg total) by mouth daily.   [DISCONTINUED] Dupilumab (DUPIXENT) 300 MG/2ML SOPN Inject 300 mg into the skin every 14 (fourteen) days. Starting at day 15 for maintenance. (Patient not taking: Reported on 03/02/2023)   [DISCONTINUED] omeprazole (PRILOSEC) 20 MG capsule TAKE 1 CAPSULE BY MOUTH EVERY DAY   No facility-administered encounter medications on file as of 07/07/2023.      Medical History: Past Medical History:  Diagnosis Date   Allergy    Basal cell carcinoma 06/17/2017   right chest    GERD (gastroesophageal reflux disease)  Hypertension    Left tennis elbow    Morbid obesity (HCC) 07/13/2016     Vital Signs: BP (!) 170/86   Pulse 88   Temp 97.9 F (36.6 C)   Ht 5\' 2"  (1.575 m)   Wt 240 lb (108.9 kg)   LMP 11/22/2013 (Approximate)   SpO2 97%   BMI 43.90 kg/m   ROS: As per HPI.  All other pertinent ROS negative.     Review of Systems  Musculoskeletal:  Positive for arthralgias and back pain (Right hip and low back pain).    Physical Exam Musculoskeletal:     Lumbar back: Tenderness present. No deformity. Decreased range of  motion.     Left hip: Bony tenderness (Left hip discomfort with palpapation at tranchanteric bursa) present. No deformity. Decreased range of motion.     No results found for this or any previous visit (from the past 24 hours).  Assessment/Plan: 1. Left hip pain (Primary) Left hip impingement versus left trochanter bursitis.  We discussed trying prednisone 40 mg each morning with breakfast x 5 days to see if this was helpful.  Unlikely, from fall several months ago but certainly could have weekend her left leg.  Hold off on ibuprofen or Aleve while taking prednisone.  I would recommend following up with chiropractor per our discussion.  If no improvement, can refer to Ortho versus imaging.  - predniSONE (DELTASONE) 20 MG tablet; Take 1 tablet (20 mg total) by mouth daily with breakfast.  Dispense: 10 tablet; Refill: 0   General Counseling: shulamis wenberg understanding of the findings of todays visit and agrees with plan of treatment. I have discussed any further diagnostic evaluation that may be needed or ordered today. We also reviewed her medications today. she has been encouraged to call the office with any questions or concerns that should arise related to todays visit.   No orders of the defined types were placed in this encounter.   No orders of the defined types were placed in this encounter.   I spent 20 minutes dedicated to the care of this patient (face-to-face and non-face-to-face) on the date of the encounter to include what is described in the assessment and plan.   Durenda Hurt, NP 07/07/2023 11:09 AM

## 2023-07-13 DIAGNOSIS — M542 Cervicalgia: Secondary | ICD-10-CM | POA: Diagnosis not present

## 2023-07-13 DIAGNOSIS — M9901 Segmental and somatic dysfunction of cervical region: Secondary | ICD-10-CM | POA: Diagnosis not present

## 2023-07-19 DIAGNOSIS — M9901 Segmental and somatic dysfunction of cervical region: Secondary | ICD-10-CM | POA: Diagnosis not present

## 2023-07-19 DIAGNOSIS — M542 Cervicalgia: Secondary | ICD-10-CM | POA: Diagnosis not present

## 2023-07-22 DIAGNOSIS — M9901 Segmental and somatic dysfunction of cervical region: Secondary | ICD-10-CM | POA: Diagnosis not present

## 2023-07-22 DIAGNOSIS — M542 Cervicalgia: Secondary | ICD-10-CM | POA: Diagnosis not present

## 2023-07-26 DIAGNOSIS — M542 Cervicalgia: Secondary | ICD-10-CM | POA: Diagnosis not present

## 2023-07-26 DIAGNOSIS — M9901 Segmental and somatic dysfunction of cervical region: Secondary | ICD-10-CM | POA: Diagnosis not present

## 2023-07-29 DIAGNOSIS — M542 Cervicalgia: Secondary | ICD-10-CM | POA: Diagnosis not present

## 2023-07-29 DIAGNOSIS — M9901 Segmental and somatic dysfunction of cervical region: Secondary | ICD-10-CM | POA: Diagnosis not present

## 2023-08-05 DIAGNOSIS — M542 Cervicalgia: Secondary | ICD-10-CM | POA: Diagnosis not present

## 2023-08-05 DIAGNOSIS — M9901 Segmental and somatic dysfunction of cervical region: Secondary | ICD-10-CM | POA: Diagnosis not present

## 2023-08-16 ENCOUNTER — Ambulatory Visit: Admitting: Dermatology

## 2023-08-16 DIAGNOSIS — L304 Erythema intertrigo: Secondary | ICD-10-CM

## 2023-08-16 DIAGNOSIS — L918 Other hypertrophic disorders of the skin: Secondary | ICD-10-CM

## 2023-08-16 DIAGNOSIS — L209 Atopic dermatitis, unspecified: Secondary | ICD-10-CM

## 2023-08-16 DIAGNOSIS — L2081 Atopic neurodermatitis: Secondary | ICD-10-CM

## 2023-08-16 MED ORDER — NEMLUVIO 30 MG ~~LOC~~ AUIJ: 30.0000 mg | AUTO-INJECTOR | SUBCUTANEOUS | 6 refills | Status: DC

## 2023-08-16 MED ORDER — OPZELURA 1.5 % EX CREA: TOPICAL_CREAM | CUTANEOUS | 2 refills | Status: DC

## 2023-08-16 MED ORDER — FLUCONAZOLE 150 MG PO TABS: 150.0000 mg | ORAL_TABLET | ORAL | 3 refills | Status: DC

## 2023-08-16 MED ORDER — KETOCONAZOLE 2 % EX CREA: TOPICAL_CREAM | CUTANEOUS | 5 refills | Status: DC

## 2023-08-16 MED ORDER — TACROLIMUS 0.1 % EX OINT: TOPICAL_OINTMENT | CUTANEOUS | 2 refills | Status: DC

## 2023-08-16 MED ORDER — NEMLUVIO 30 MG ~~LOC~~ AUIJ: 60.0000 mg | AUTO-INJECTOR | SUBCUTANEOUS | 0 refills | Status: DC

## 2023-08-16 MED ORDER — SERNIVO 0.05 % EX EMUL: 1.0000 | CUTANEOUS | 2 refills | Status: DC

## 2023-08-16 NOTE — Progress Notes (Signed)
 Follow-Up Visit   Subjective  Bridget Reed is a 63 y.o. female who presents for the following: Dermatitis flare, legs, back, hip, breast, pt takes Diflucan 150mg  1 po x 1 dose taking around every 2 weeks, Ketoconazole 2% cr qd, not using Opzelura, hx of being on Dupixent d/c due to s/e of dry eyes, vision changes, SOB, and joint pain The patient has skin tags on neck that get irritated that she would like them removed   The following portions of the chart were reviewed this encounter and updated as appropriate: medications, allergies, medical history  Review of Systems:  No other skin or systemic complaints except as noted in HPI or Assessment and Plan.  Objective  Well appearing patient in no apparent distress; mood and affect are within normal limits.   A focused examination was performed of the following areas: Face, neck, chest, legs, buttocks  Relevant exam findings are noted in the Assessment and Plan.  L lower neck x 5, R lower neck x 8 (13) Fleshy, skin-colored pedunculated papules.    Skin tag removal generally is considered cosmetic and not covered by insurance.  Cosmetic removal fee is $115 for up to 15 tags removed. You can contact your insurance to see if skin tag removal is a medically necessary covered benefit.  Even if covered, copay and deductible payments would apply. CPT code: 16109 Diagnosis code: L91.8   Assessment & Plan   ATOPIC DERMATITIS Face, body Exam: pinks scaly patches with excoriations R hip, buttocks, bil areolas, small pink paps bil pretibials, scattered pink paps chest, periocular edema and erythema 10% BSA  Chronic and persistent condition with duration or expected duration over one year. Condition is bothersome/symptomatic for patient. Currently flared.   Atopic dermatitis (eczema) is a chronic, relapsing, pruritic condition that can significantly affect quality of life. It is often associated with allergic rhinitis and/or asthma and  can require treatment with topical medications, phototherapy, or in severe cases biologic injectable medication (Dupixent; Adbry) or Oral JAK inhibitors.  Treatment Plan: Start Tacrolimus 0.1% oint qd/bid aa eczema body Start Sernivo spray qd/bid aa eczema on body, avoid f/g/a sample x 1 L ot ZABR exp 04/2025 Cont Ketoconazole 2% cr qd aa eczema Cont Diflucan 150mg  change to 1 po q wk Pending approval start Nemluvio  Recommend gentle skin care.  Topical steroids (such as triamcinolone, fluocinolone, fluocinonide, mometasone, clobetasol, halobetasol, betamethasone, hydrocortisone) can cause thinning and lightening of the skin if they are used for too long in the same area. Your physician has selected the right strength medicine for your problem and area affected on the body. Please use your medication only as directed by your physician to prevent side effects.   Side effects of fluconazole (diflucan) include nausea, diarrhea, headache, dizziness, taste changes, rare risk of irritation of the liver, allergy, or decreased blood counts (which could show up as infection or tiredness).  SKIN TAG (13) L lower neck x 5, R lower neck x 8 (13) Epidermal / dermal shaving - L lower neck x 5, R lower neck x 8 (13)  Lesion diameter (cm):  0.2 Informed consent: discussed and consent obtained   Anesthesia: the lesion was anesthetized in a standard fashion   Anesthetic:  1% lidocaine w/ epinephrine 1-100,000 buffered w/ 8.4% NaHCO3 Instrument used: scissors   Hemostasis achieved with: pressure, aluminum chloride and electrodesiccation   Outcome: patient tolerated procedure well   Post-procedure details: wound care instructions given   ERYTHEMA INTERTRIGO   Related  Medications ketoconazole (NIZORAL) 2 % cream qd to itchy areas on body  Return for as scheduled.  I, Ardis Rowan, RMA, am acting as scribe for Willeen Niece, MD .   Documentation: I have reviewed the above documentation for accuracy  and completeness, and I agree with the above.  Willeen Niece, MD

## 2023-08-16 NOTE — Patient Instructions (Addendum)
 Sernivo sent to [facility], phone # [phone] Sernvo 1-2 times a day as needed for itchy areas on body, avoid face, groin, axilla  Tacrolimus 0.1% ointment 1-2 times a day to face and body as needed  Ketoconazole 2% cream once daily to itchy areas face, body  Diflucan 150mg  1 pill weekly  Due to recent changes in healthcare laws, you may see results of your pathology and/or laboratory studies on MyChart before the doctors have had a chance to review them. We understand that in some cases there may be results that are confusing or concerning to you. Please understand that not all results are received at the same time and often the doctors may need to interpret multiple results in order to provide you with the best plan of care or course of treatment. Therefore, we ask that you please give [place] 2 business days to thoroughly review all your results before contacting the office for clarification. Should we see a critical lab result, you will be contacted sooner.   If You Need Anything After Your Visit  If you have any questions or concerns for your doctor, please call our main line at [phone] and press option 4 to reach your doctor's medical assistant. If no one answers, please leave a voicemail as directed and we will return your call as soon as possible. Messages left after 4 pm will be answered the following business day.   You may also send [place] a message via MyChart. We typically respond to MyChart messages within 1-2 business days.  For prescription refills, please ask your pharmacy to contact our office. Our fax number is [phone].  If you have an urgent issue when the clinic is closed that cannot wait until the next business day, you can page your doctor at the number below.    Please note that while we do our best to be available for urgent issues outside of office hours, we are not available 24/7.   If you have an urgent issue and are unable to reach [place], you may choose to  seek medical care at your doctor's office, retail clinic, urgent care center, or emergency room.  If you have a medical emergency, please immediately call 911 or go to the emergency department.  Pager Numbers  - Dr. Gwen Pounds: [phone]  - Dr. Roseanne Reno: [phone]  - Dr. Katrinka Blazing: [phone]   In the event of inclement weather, please call our main line at [phone] for an update on the status of any delays or closures.  Dermatology Medication Tips: Please keep the boxes that topical medications come in in order to help keep track of the instructions about where and how to use these. Pharmacies typically print the medication instructions only on the boxes and not directly on the medication tubes.   If your medication is too expensive, please contact our office at [phone] option 4 or send [place] a message through MyChart.   We are unable to tell what your co-pay for medications will be in advance as this is different depending on your insurance coverage. However, we may be able to find a substitute medication at lower cost or fill out paperwork to get insurance to cover a needed medication.   If a prior authorization is required to get your medication covered by your insurance company, please allow [place] 1-2 business days to complete this process.  Drug prices often vary depending on where the prescription is filled and some pharmacies may offer cheaper prices.  The website www.goodrx.com  contains coupons for medications through different pharmacies. The prices here do not account for what the cost may be with help from insurance (it may be cheaper with your insurance), but the website can give you the price if you did not use any insurance.  - You can print the associated coupon and take it with your prescription to the pharmacy.  - You may also stop by our office during regular business hours and pick up a GoodRx coupon card.  - If you need your prescription sent electronically to a  different pharmacy, notify our office through [facility] or by phone at [phone] option 4.     Si Usted Necesita Algo Despus de Su Visita  Tambin puede enviarnos un mensaje a travs de Clinical cytogeneticist. Por lo general respondemos a los mensajes de MyChart en el transcurso de 1 a 2 das hbiles.  Para renovar recetas, por favor pida a su farmacia que se ponga en contacto con nuestra oficina. Annie Sable de fax es [place] [phone].  Si tiene un asunto urgente cuando la clnica est cerrada y que no puede esperar hasta el siguiente da hbil, puede llamar/localizar a su doctor(a) al nmero que aparece a continuacin.   Por favor, tenga en cuenta que aunque hacemos todo lo posible para estar disponibles para asuntos urgentes fuera del horario de [place], no estamos disponibles las 24 horas del da, los 7 [address]  [address] de la [place].   Si tiene un problema urgente y no puede comunicarse con nosotros, puede optar por buscar atencin mdica  en el consultorio de su doctor(a), en una clnica privada, en un centro de atencin urgente o en una sala de emergencias.  Si tiene Engineer, drilling, por favor llame inmediatamente al 911 o vaya a la sala de emergencias.  Nmeros de bper  - Dr. Gwen Pounds: [phone]  - Dra. Roseanne Reno: [phone]  - Dr. Katrinka Blazing: [phone]   En caso de inclemencias del tiempo, por favor llame a Lacy Duverney principal al [phone] para una actualizacin sobre el [place] de cualquier retraso o cierre.  Consejos para la medicacin en dermatologa: Por favor, guarde las cajas en las que vienen los medicamentos de uso tpico para ayudarle a seguir las instrucciones sobre dnde y cmo usarlos. Las farmacias generalmente imprimen las instrucciones del medicamento slo en las cajas y no directamente en los tubos del [place].   Si su medicamento es muy caro, por favor, pngase en contacto con Rolm Gala llamando al [phone] y presione la opcin 4 o envenos un  mensaje a travs de Clinical cytogeneticist.   No podemos decirle cul ser su copago por los medicamentos por adelantado ya que esto es diferente dependiendo de la cobertura de su seguro. Sin embargo, es posible que podamos encontrar un medicamento sustituto a Audiological scientist un formulario para que el seguro cubra el medicamento que se considera necesario.   Si se requiere una autorizacin previa para que su compaa de seguros [place] su medicamento, por favor permtanos de 1 a 2 das hbiles para completar [address]  Los precios de los medicamentos varan con frecuencia dependiendo del Environmental consultant de dnde se surte la receta y alguna farmacias pueden ofrecer precios ms baratos.  El sitio web www.goodrx.com tiene cupones para medicamentos de Health and safety inspector. Los precios aqu no tienen en cuenta lo que podra costar con la ayuda del seguro (puede ser ms barato con su seguro), pero el sitio web puede darle el precio si no utiliz Tourist information centre manager.  - Puede imprimir  el cupn correspondiente y llevarlo con su receta a la farmacia.  - Tambin puede pasar por nuestra oficina durante el horario de atencin regular y Education officer, museum una tarjeta de cupones de GoodRx.  - Si necesita que su receta se enve electrnicamente a una farmacia diferente, informe a nuestra oficina a travs de MyChart de [place] o por telfono llamando al [phone] y presione la opcin 4.

## 2023-08-17 DIAGNOSIS — M9901 Segmental and somatic dysfunction of cervical region: Secondary | ICD-10-CM | POA: Diagnosis not present

## 2023-08-17 DIAGNOSIS — M542 Cervicalgia: Secondary | ICD-10-CM | POA: Diagnosis not present

## 2023-08-24 DIAGNOSIS — R7309 Other abnormal glucose: Secondary | ICD-10-CM | POA: Diagnosis not present

## 2023-08-30 ENCOUNTER — Other Ambulatory Visit: Payer: Self-pay

## 2023-08-30 MED ORDER — NEMLUVIO 30 MG ~~LOC~~ AUIJ: 30.0000 mg | AUTO-INJECTOR | SUBCUTANEOUS | 6 refills | Status: DC

## 2023-08-30 MED ORDER — NEMLUVIO 30 MG ~~LOC~~ AUIJ: 60.0000 mg | AUTO-INJECTOR | SUBCUTANEOUS | 0 refills | Status: DC

## 2023-08-31 ENCOUNTER — Ambulatory Visit: Payer: BC Managed Care – PPO | Admitting: Dermatology

## 2023-09-07 ENCOUNTER — Other Ambulatory Visit: Payer: Self-pay | Admitting: Family

## 2023-09-07 ENCOUNTER — Ambulatory Visit (INDEPENDENT_AMBULATORY_CARE_PROVIDER_SITE_OTHER): Payer: Self-pay | Admitting: Adult Health

## 2023-09-07 DIAGNOSIS — R051 Acute cough: Secondary | ICD-10-CM

## 2023-09-07 DIAGNOSIS — I10 Essential (primary) hypertension: Secondary | ICD-10-CM

## 2023-09-07 DIAGNOSIS — R600 Localized edema: Secondary | ICD-10-CM

## 2023-09-07 DIAGNOSIS — U071 COVID-19: Secondary | ICD-10-CM

## 2023-09-07 LAB — POC SOFIA 2 FLU + SARS ANTIGEN FIA
Influenza A, POC: NEGATIVE
Influenza B, POC: NEGATIVE
SARS Coronavirus 2 Ag: POSITIVE — AB

## 2023-09-07 NOTE — Progress Notes (Signed)
 [facility] [address] [place], [place] [address]   Office Visit Note  Patient Name: Bridget Reed Date of Birth [birth year]  Medical Record number [id number]  Date of Service: 09/07/2023  No chief complaint on file.    HPI Pt is here for a sick visit. She was away at a conference last week. Yesterday and today she has had cough, ear pressure, headache, congestion, scratchy throat. Denies fever, chills. People from the conference are sick, but no close contacts.    Current Medication:  Outpatient Encounter Medications as of 09/07/2023  Medication Sig   Betamethasone Dipropionate (SERNIVO) 0.05 % EMUL Apply 1 Application topically as directed. qd to bid aa eczema on body, avoid face, groin, axilla   fexofenadine (ALLEGRA) 60 MG tablet Take 60 mg by mouth daily.   fluconazole (DIFLUCAN) 150 MG tablet Take 1 tablet (150 mg total) by mouth once a week.   furosemide (LASIX) 20 MG tablet TAKE ONE TABLET BY MOUTH AS NEEDED FOR EDEMA   hydrochlorothiazide (HYDRODIURIL) 25 MG tablet TAKE 1 TABLET (25 MG TOTAL) BY MOUTH DAILY.   hydrOXYzine (ATARAX) 25 MG tablet Take 1 tablet (25 mg total) by mouth at bedtime as needed.   ketoconazole (NIZORAL) 2 % cream qd to itchy areas on body   Levothyroxine Sodium (TIROSINT-SOL) 100 MCG/ML SOLN TAKE BY MOUTH DAILY   nemolizumab-ilto (NEMLUVIO) 30 MG SQ injection Inject 60 mg into the skin as directed. Inject 2 pens sq at week 0   nemolizumab-ilto (NEMLUVIO) 30 MG SQ injection Inject 30 mg into the skin every 30 (thirty) days. Inject 1 pen sq every 4 weeks   omeprazole (PRILOSEC) 40 MG capsule TAKE 1 CAPSULE (40 MG TOTAL) BY MOUTH DAILY.   predniSONE (DELTASONE) 20 MG tablet Take 1 tablet (20 mg total) by mouth daily with breakfast.   Semaglutide-Weight Management (WEGOVY) 1 MG/0.5ML SOAJ Inject 1 mg into the skin once a week.   tacrolimus (PROTOPIC) 0.1 % ointment Apply topically as directed. qd to bid aa eczema on face and body    valsartan (DIOVAN) 160 MG tablet TAKE 1 TABLET BY MOUTH EVERY DAY   No facility-administered encounter medications on file as of 09/07/2023.      Medical History: Past Medical History:  Diagnosis Date   Allergy    Basal cell carcinoma 06/17/2017   right chest    GERD (gastroesophageal reflux disease)    Hypertension    Left tennis elbow    Morbid obesity (HCC) 07/13/2016     Vital Signs: LMP 11/22/2013 (Approximate)    Review of Systems  Constitutional:  Negative for chills, fatigue and fever.  HENT:  Positive for congestion, ear pain and sore throat.   Eyes:  Negative for pain and redness.  Respiratory:  Positive for cough.   Cardiovascular:  Negative for chest pain.  Gastrointestinal:  Negative for diarrhea, nausea and vomiting.  Neurological:  Positive for headaches.    Physical Exam Vitals reviewed.  Constitutional:      Appearance: Normal appearance.  HENT:     Head: Normocephalic.     Right Ear: Tympanic membrane and ear canal normal.     Left Ear: Tympanic membrane and ear canal normal.     Nose: Nose normal.  Neurological:     Mental Status: She is alert.    Results for orders placed or performed in visit on 09/07/23 (from the past 24 hours)  POC SOFIA 2 FLU + SARS ANTIGEN FIA  Status: Abnormal   Collection Time: 09/07/23 10:25 AM  Result Value Ref Range   Influenza A, POC Negative Negative   Influenza B, POC Negative Negative   SARS Coronavirus 2 Ag Positive (A) Negative    Assessment/Plan: 1. COVID-19 (Primary) Rest, and drink plenty of water.  Use cough drops, gargle warm sal water or drink wam liquids (like tea with honey) as needed for cough/throat irritation.   Take over-the-counter medicines (such as Dayquil or Nyquil) as discussed at your visit to help manage your symptoms.  Send a MyChart message to the provider or schedule a return appointment as needed for new/worsening symptoms (especially shortness of breath or chest pain) or if symptoms  not improving with recommended treatment over the next 5-7 days.     - POC SOFIA 2 FLU + SARS ANTIGEN FIA  2. Acute cough - POC SOFIA 2 FLU + SARS ANTIGEN FIA     General Counseling: [name] verbalizes understanding of the findings of todays visit and agrees with plan of treatment. I have discussed any further diagnostic evaluation that may be needed or ordered today. We also reviewed her medications today. she has been encouraged to call the office with any questions or concerns that should arise related to todays visit.   Orders Placed This Encounter  Procedures   POC SOFIA 2 FLU + SARS ANTIGEN FIA    No orders of the defined types were placed in this encounter.   Time spent:15 Minutes    Sheria Dills AGNP-C Nurse Practitioner

## 2023-09-16 DIAGNOSIS — M9901 Segmental and somatic dysfunction of cervical region: Secondary | ICD-10-CM | POA: Diagnosis not present

## 2023-09-16 DIAGNOSIS — M542 Cervicalgia: Secondary | ICD-10-CM | POA: Diagnosis not present

## 2023-09-21 ENCOUNTER — Ambulatory Visit: Payer: BC Managed Care – PPO | Admitting: Dermatology

## 2023-09-21 DIAGNOSIS — L209 Atopic dermatitis, unspecified: Secondary | ICD-10-CM

## 2023-09-21 DIAGNOSIS — Z79899 Other long term (current) drug therapy: Secondary | ICD-10-CM

## 2023-09-21 DIAGNOSIS — L2089 Other atopic dermatitis: Secondary | ICD-10-CM

## 2023-09-21 NOTE — Patient Instructions (Signed)

## 2023-09-21 NOTE — Progress Notes (Signed)
   Follow-Up Visit   Subjective  Bridget Reed is a 63 y.o. female who presents for the following: Atopic Dermatitis of the hips, buttocks, breasts, legs. She is improved with tacrolimus  ointment, Sernivo  spray to more severe areas, ketoconazole  2% cream, Diflucan  every 2 weeks. One small outbreak on buttocks today. Never started Nemluvio injections.   The following portions of the chart were reviewed this encounter and updated as appropriate: medications, allergies, medical history  Review of Systems:  No other skin or systemic complaints except as noted in HPI or Assessment and Plan.  Objective  Well appearing patient in no apparent distress; mood and affect are within normal limits.  Areas Examined: Face, trunk, extremities  Relevant physical exam findings are noted in the Assessment and Plan.    Assessment & Plan    ATOPIC DERMATITIS Exam: Pink scaly papules on left buttock; xerosis on the arms. 1% BSA  Chronic and persistent condition with duration or expected duration over one year. Condition is improving with treatment but not currently at goal.   Atopic dermatitis (eczema) is a chronic, relapsing, pruritic condition that can significantly affect quality of life. It is often associated with allergic rhinitis and/or asthma and can require treatment with topical medications, phototherapy, or in severe cases biologic injectable medication (Dupixent ; Adbry) or Oral JAK inhibitors.  Treatment Plan: Continue Tacrolimus  0.1% oint qd/bid aa eczema body Continue Sernivo  spray qd/bid aa eczema on body, avoid f/g/a  Cont Ketoconazole  2% cr qd aa rash body folds Cont Diflucan  150mg  change to 1 po q 2 weeks. Recommend gentle skin care.   Return 3-4 month, for TBSE, Hx BCC, Atopic Dermatitis.  IBernardine Bridegroom, CMA, am acting as scribe for Artemio Larry, MD .   Documentation: I have reviewed the above documentation for accuracy and completeness, and I agree with the  above.  Artemio Larry, MD

## 2023-09-22 ENCOUNTER — Ambulatory Visit: Payer: BC Managed Care – PPO | Admitting: Family

## 2023-09-23 DIAGNOSIS — R7309 Other abnormal glucose: Secondary | ICD-10-CM | POA: Diagnosis not present

## 2023-09-27 ENCOUNTER — Encounter: Payer: Self-pay | Admitting: Family

## 2023-09-27 ENCOUNTER — Telehealth (INDEPENDENT_AMBULATORY_CARE_PROVIDER_SITE_OTHER): Admitting: Family

## 2023-09-27 ENCOUNTER — Telehealth: Payer: Self-pay

## 2023-09-27 ENCOUNTER — Other Ambulatory Visit (HOSPITAL_COMMUNITY): Payer: Self-pay

## 2023-09-27 ENCOUNTER — Telehealth: Payer: Self-pay | Admitting: Family

## 2023-09-27 ENCOUNTER — Ambulatory Visit: Admitting: Family

## 2023-09-27 ENCOUNTER — Other Ambulatory Visit: Payer: Self-pay | Admitting: Family

## 2023-09-27 VITALS — BP 130/86 | HR 98 | Temp 98.1°F | Ht 62.0 in | Wt 255.0 lb

## 2023-09-27 DIAGNOSIS — E039 Hypothyroidism, unspecified: Secondary | ICD-10-CM

## 2023-09-27 DIAGNOSIS — I10 Essential (primary) hypertension: Secondary | ICD-10-CM

## 2023-09-27 DIAGNOSIS — E559 Vitamin D deficiency, unspecified: Secondary | ICD-10-CM

## 2023-09-27 DIAGNOSIS — D582 Other hemoglobinopathies: Secondary | ICD-10-CM

## 2023-09-27 DIAGNOSIS — E871 Hypo-osmolality and hyponatremia: Secondary | ICD-10-CM

## 2023-09-27 DIAGNOSIS — Z6841 Body Mass Index (BMI) 40.0 and over, adult: Secondary | ICD-10-CM

## 2023-09-27 DIAGNOSIS — R7303 Prediabetes: Secondary | ICD-10-CM

## 2023-09-27 LAB — HEMOGLOBIN A1C: Hgb A1c MFr Bld: 5.8 % (ref 4.6–6.5)

## 2023-09-27 LAB — CBC
HCT: 44.9 % (ref 36.0–46.0)
Hemoglobin: 15.2 g/dL — ABNORMAL HIGH (ref 12.0–15.0)
MCHC: 33.9 g/dL (ref 30.0–36.0)
MCV: 88.3 fl (ref 78.0–100.0)
Platelets: 254 10*3/uL (ref 150.0–400.0)
RBC: 5.08 Mil/uL (ref 3.87–5.11)
RDW: 13.9 % (ref 11.5–15.5)
WBC: 10.2 10*3/uL (ref 4.0–10.5)

## 2023-09-27 LAB — MICROALBUMIN / CREATININE URINE RATIO
Creatinine,U: 103.6 mg/dL
Microalb Creat Ratio: 12.9 mg/g (ref 0.0–30.0)
Microalb, Ur: 1.3 mg/dL (ref 0.0–1.9)

## 2023-09-27 LAB — TSH: TSH: 3.56 u[IU]/mL (ref 0.35–5.50)

## 2023-09-27 LAB — BASIC METABOLIC PANEL WITH GFR
BUN: 12 mg/dL (ref 6–23)
CO2: 31 meq/L (ref 19–32)
Calcium: 9.2 mg/dL (ref 8.4–10.5)
Chloride: 98 meq/L (ref 96–112)
Creatinine, Ser: 0.63 mg/dL (ref 0.40–1.20)
GFR: 95.54 mL/min (ref >60.00)
Glucose, Bld: 110 mg/dL — ABNORMAL HIGH (ref 70–99)
Potassium: 3.9 meq/L (ref 3.5–5.1)
Sodium: 139 meq/L (ref 135–145)

## 2023-09-27 LAB — VITAMIN B12: Vitamin B-12: 249 pg/mL (ref 211–911)

## 2023-09-27 MED ORDER — LEVOTHYROXINE SODIUM 100 MCG PO CAPS: 1.0000 | ORAL_CAPSULE | Freq: Every day | ORAL | 0 refills | Status: DC

## 2023-09-27 MED ORDER — ZEPBOUND 5 MG/0.5ML ~~LOC~~ SOAJ: 5.0000 mg | SUBCUTANEOUS | 0 refills | Status: DC

## 2023-09-27 MED ORDER — ZEPBOUND 7.5 MG/0.5ML ~~LOC~~ SOAJ: 7.5000 mg | SUBCUTANEOUS | 1 refills | Status: DC

## 2023-09-27 MED ORDER — ZEPBOUND 2.5 MG/0.5ML ~~LOC~~ SOAJ: 2.5000 mg | SUBCUTANEOUS | 0 refills | Status: DC

## 2023-09-27 NOTE — Assessment & Plan Note (Signed)
Urine microalbumin today. Stable.

## 2023-09-27 NOTE — Assessment & Plan Note (Signed)
 Tsh ordered pending results.  Will send RX for tirosint  100 mcg once daily.

## 2023-09-27 NOTE — Telephone Encounter (Signed)
 Can we start a p/a for zepbound Was on wegovy  however failure with side effects

## 2023-09-27 NOTE — Assessment & Plan Note (Signed)
 Pt advised to work on diet and exercise as tolerated Trial zepbound pending prior auth

## 2023-09-27 NOTE — Progress Notes (Signed)
 Established Patient Office Visit  Subjective:      CC:  Chief Complaint  Patient presents with   Establish Care    HPI: Bridget Reed is a 63 y.o. female presenting on 09/27/2023 for Establish Care  Obesity, was approved with [employer]  however she stopped taking because she had a lot of heartburn for at least a few days afterward. She stopped taking this but she does admit that she was eating higher fatty foods that might have contributed. She wants to retry.   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 She is on diflucan  every 2-3 weeks as needed. She states even after stopping the tirosint  tablets she still had itching and rash until candida was starting to be treated. She has been on the solution of tirosint  since and no issues, but she does state now too expensive to continue.    Social history:  Relevant past medical, surgical, family and social history reviewed and updated as indicated. Interim medical history since our last visit reviewed.  Allergies and medications reviewed and updated.  DATA REVIEWED: CHART IN EPIC     ROS: Negative unless specifically indicated above in HPI.    Current Outpatient Medications:    Betamethasone  Dipropionate (SERNIVO ) 0.05 % EMUL, Apply 1 Application topically as directed. qd to bid aa eczema on body, avoid face, groin, axilla, Disp: 120 mL, Rfl: 2   fexofenadine (ALLEGRA) 60 MG tablet, Take 60 mg by mouth daily., Disp: , Rfl:    fluconazole  (DIFLUCAN ) 150 MG tablet, Take 1 tablet (150 mg total) by mouth once a week., Disp: 4 tablet, Rfl: 3   furosemide  (LASIX ) 20 MG tablet, TAKE ONE TABLET BY MOUTH AS NEEDED FOR EDEMA, Disp: 30 tablet, Rfl: 0   hydrochlorothiazide  (HYDRODIURIL ) 25  MG tablet, TAKE 1 TABLET (25 MG TOTAL) BY MOUTH DAILY., Disp: 90 tablet, Rfl: 3   hydrOXYzine  (ATARAX ) 25 MG tablet, Take 1 tablet (25 mg total) by mouth at bedtime as needed., Disp: 30 tablet, Rfl: 5   ketoconazole  (NIZORAL ) 2 % cream, qd to itchy areas on body, Disp: 60 g, Rfl: 5   Levothyroxine  Sodium (TIROSINT ) 100 MCG CAPS, Take 1 capsule (100 mcg total) by mouth daily before breakfast., Disp: 90 capsule, Rfl: 0   omeprazole  (PRILOSEC) 40 MG capsule, TAKE 1 CAPSULE (40 MG TOTAL) BY MOUTH DAILY., Disp: 90 capsule, Rfl: 1   tacrolimus  (PROTOPIC ) 0.1 % ointment, Apply topically as directed. qd to bid aa eczema on face and body, Disp: 100 g, Rfl: 2   tirzepatide (ZEPBOUND) 2.5 MG/0.5ML Pen, Inject 2.5 mg into the skin once a week. Inject 0.25 mg [place] weekly for four weeks, then increase to 0.5 mg weekly, Disp: 2 mL, Rfl: 0   [START ON 10/18/2023] tirzepatide (ZEPBOUND) 5 MG/0.5ML Pen, Inject 5 mg into the skin once a week. Inject 0.5 mg [place] weekly, Disp: 2 mL, Rfl: 0   [START ON 11/08/2023] tirzepatide (ZEPBOUND) 7.5 MG/0.5ML Pen, Inject 7.5 mg into the skin once a week., Disp: 2 mL, Rfl: 1   valsartan  (DIOVAN ) 160 MG tablet, TAKE 1 TABLET BY MOUTH EVERY DAY, Disp: 90 tablet, Rfl: 3      Objective:    BP 130/86 (BP Location: Left Arm, Patient Position:  Sitting, Cuff Size: Large)   Pulse 98   Temp 98.1 F (36.7 C) (Temporal)   Ht 5\' 2"  (1.575 m)   Wt 255 lb (115.7 kg)   LMP 11/22/2013 (Approximate)   SpO2 99%   BMI 46.64 kg/m   Wt Readings from Last 3 Encounters:  09/27/23 255 lb (115.7 kg)  07/07/23 240 lb (108.9 kg)  03/25/23 256 lb (116.1 kg)    Physical Exam Constitutional:      General: She is not in acute distress.    Appearance: Normal appearance. She is obese. She is not ill-appearing, toxic-appearing or diaphoretic.  HENT:     Head: Normocephalic.  Cardiovascular:     Rate and Rhythm: Normal rate and regular rhythm.  Pulmonary:     Effort: Pulmonary effort is normal.   Musculoskeletal:        General: Normal range of motion.  Neurological:     General: No focal deficit present.     Mental Status: She is alert and oriented to person, place, and time. Mental status is at baseline.  Psychiatric:        Mood and Affect: Mood normal.        Behavior: Behavior normal.        Thought Content: Thought content normal.        Judgment: Judgment normal.           Assessment & Plan:  Vitamin D  deficiency  Acquired hypothyroidism Assessment & Plan: Tsh ordered pending results.  Will send RX for tirosint  100 mcg once daily.    Orders: -     TSH -     Levothyroxine  Sodium; Take 1 capsule (100 mcg total) by mouth daily before breakfast.  Dispense: 90 capsule; Refill: 0  Essential hypertension Assessment & Plan: Urine microalbumin today Stable   Orders: -     Microalbumin / creatinine urine ratio -     Zepbound; Inject 5 mg into the skin once a week. Inject 0.5 mg [place] weekly  Dispense: 2 mL; Refill: 0 -     Zepbound; Inject 2.5 mg into the skin once a week. Inject 0.25 mg [place] weekly for four weeks, then increase to 0.5 mg weekly  Dispense: 2 mL; Refill: 0 -     Zepbound; Inject 7.5 mg into the skin once a week.  Dispense: 2 mL; Refill: 1  Hyponatremia -     Basic metabolic panel with GFR  Prediabetes -     Basic metabolic panel with GFR -     Hemoglobin A1c -     Zepbound; Inject 5 mg into the skin once a week. Inject 0.5 mg [place] weekly  Dispense: 2 mL; Refill: 0 -     Zepbound; Inject 2.5 mg into the skin once a week. Inject 0.25 mg [place] weekly for four weeks, then increase to 0.5 mg weekly  Dispense: 2 mL; Refill: 0 -     Zepbound; Inject 7.5 mg into the skin once a week.  Dispense: 2 mL; Refill: 1  Elevated hemoglobin (HCC) -     Vitamin B12 -     CBC  Morbid obesity with BMI of 45.0-49.9, adult (HCC) Assessment & Plan: Pt advised to work on diet and exercise as tolerated Trial zepbound pending prior auth  Orders: -     Zepbound; Inject 5  mg into the skin once a week. Inject 0.5 mg [place] weekly  Dispense: 2 mL; Refill: 0 -     Zepbound; Inject 2.5  mg into the skin once a week. Inject 0.25 mg [place] weekly for four weeks, then increase to 0.5 mg weekly  Dispense: 2 mL; Refill: 0 -     Zepbound; Inject 7.5 mg into the skin once a week.  Dispense: 2 mL; Refill: 1     Return in about 6 months (around 03/29/2024) for f/u CPE.  Felicita Horns, MSN, APRN, FNP-C [facility]

## 2023-09-27 NOTE — Telephone Encounter (Signed)
 erroneous

## 2023-09-27 NOTE — Telephone Encounter (Signed)
 Pharmacy Patient Advocate Encounter   Received notification from Patient Pharmacy that prior authorization for Zepbound 2.5 is required/requested.   Insurance verification completed.   The patient is insured through [facility] .   Per test claim: PA required; PA submitted to above mentioned insurance via CoverMyMeds Key/confirmation #/EOC [id number] Status is pending

## 2023-09-27 NOTE — Telephone Encounter (Signed)
 Pharmacy Patient Advocate Encounter  Received notification from [facility] that Prior Authorization for Zepbound 2.5 has been APPROVED from 09/27/23 to 03/29/24. Ran test claim, Copay is $35.00. This test claim was processed through [facility]- copay amounts may vary at other pharmacies due to pharmacy/plan contracts, or as the patient moves through the different stages of their insurance plan.   PA #/Case ID/Reference #: [id number]

## 2023-09-27 NOTE — Telephone Encounter (Signed)
 Can we submit a prior auth for tirosint  capsules? S/e with levothyroxine  can not tolerate and tirosint  solution too costly.

## 2023-09-29 ENCOUNTER — Ambulatory Visit (INDEPENDENT_AMBULATORY_CARE_PROVIDER_SITE_OTHER)

## 2023-09-29 DIAGNOSIS — E538 Deficiency of other specified B group vitamins: Secondary | ICD-10-CM | POA: Diagnosis not present

## 2023-09-29 MED ORDER — CYANOCOBALAMIN 1000 MCG/ML IJ SOLN
1000.0000 ug | Freq: Once | INTRAMUSCULAR | Status: AC
  Administered 2023-09-29: 1000 ug via INTRAMUSCULAR

## 2023-09-29 NOTE — Progress Notes (Signed)
 Per orders of Winthrop Hawks, NP , injection of B-12 given by Eller Gut in right deltoid. Patient tolerated injection well. Patient will make appointment for 1 month.

## 2023-10-04 ENCOUNTER — Other Ambulatory Visit: Payer: Self-pay | Admitting: Family

## 2023-10-04 DIAGNOSIS — K219 Gastro-esophageal reflux disease without esophagitis: Secondary | ICD-10-CM

## 2023-10-07 ENCOUNTER — Telehealth: Payer: Self-pay

## 2023-10-07 ENCOUNTER — Other Ambulatory Visit (HOSPITAL_COMMUNITY): Payer: Self-pay

## 2023-10-07 NOTE — Telephone Encounter (Signed)
 No PA is required, rx must be written as a DAW-1, but Tirosint  capsules would be $210 dollars vs the solution which is $70 a month. Would highly recommend pt look into tirosint  manufacturer coupon online to see if she is able to apply for a copay savings card. Thank you

## 2023-10-07 NOTE — Telephone Encounter (Signed)
 Bridget Reed

## 2023-10-07 NOTE — Telephone Encounter (Signed)
 Called [facility], they stated that rx for capsules was transferred to [facility], called [facility], and they stated that they have never received a rx for Tirosint  Capsules. I provided [facility] with copay card billing information, but she was unable to re run the Tirosint  Solution due to refill too soon rejection. When running a test claim, it shows that pt could fill either Tirosint  capsules or solution for $25.00 a month, but I am unable to currently confirm this without [facility] having a script to run. Could a new rx please be sent to [facility], please let me know if/when we do this, so that I can follow up. Thank you!

## 2023-10-07 NOTE — Telephone Encounter (Signed)
 Pharmacy Patient Advocate Encounter   Received notification from RX Request Messages that prior authorization for Tirosint  100mg  capsules is required/requested.   Insurance verification completed.   The patient is insured through [facility] .   Per test claim: The current 30 day co-pay is, $210.00, without manufacturer coupon.  No PA needed at this time. This test claim was processed through [facility]- copay amounts may vary at other pharmacies due to pharmacy/plan contracts, or as the patient moves through the different stages of their insurance plan.

## 2023-10-08 ENCOUNTER — Other Ambulatory Visit (HOSPITAL_COMMUNITY): Payer: Self-pay

## 2023-10-08 NOTE — Telephone Encounter (Signed)
 MyChart message sent to pt

## 2023-10-10 MED ORDER — LEVOTHYROXINE SODIUM 100 MCG PO CAPS: 1.0000 | ORAL_CAPSULE | Freq: Every day | ORAL | 0 refills | Status: DC

## 2023-10-10 NOTE — Telephone Encounter (Signed)
 Sending in tirosint  capsules to [facility] to see.

## 2023-10-13 ENCOUNTER — Other Ambulatory Visit (HOSPITAL_COMMUNITY): Payer: Self-pay

## 2023-10-19 ENCOUNTER — Other Ambulatory Visit: Payer: Self-pay | Admitting: Dermatology

## 2023-10-20 ENCOUNTER — Other Ambulatory Visit (HOSPITAL_COMMUNITY): Payer: Self-pay

## 2023-10-21 ENCOUNTER — Other Ambulatory Visit (HOSPITAL_COMMUNITY): Payer: Self-pay

## 2023-10-21 NOTE — Telephone Encounter (Signed)
 A user error has taken place: encounter opened in error, closed for administrative reasons.

## 2023-10-24 DIAGNOSIS — R7309 Other abnormal glucose: Secondary | ICD-10-CM | POA: Diagnosis not present

## 2023-11-03 ENCOUNTER — Ambulatory Visit

## 2023-11-03 DIAGNOSIS — E538 Deficiency of other specified B group vitamins: Secondary | ICD-10-CM

## 2023-11-03 MED ORDER — CYANOCOBALAMIN 1000 MCG/ML IJ SOLN
1000.0000 ug | Freq: Once | INTRAMUSCULAR | Status: AC
  Administered 2023-11-03: 1000 ug via INTRAMUSCULAR

## 2023-11-03 NOTE — Progress Notes (Signed)
 Per orders of Felicita Horns, NP,who is out of office and Aneta Bar NP who is in office injection of vitamin b 12 given by Claretha Crocker in left deltoid. Patient tolerated injection well. Patient will make appointment for 1 month This was # 2 monthly B 12.Aaron Aas

## 2023-11-23 ENCOUNTER — Ambulatory Visit (INDEPENDENT_AMBULATORY_CARE_PROVIDER_SITE_OTHER): Payer: Self-pay | Admitting: Medical

## 2023-11-23 ENCOUNTER — Encounter: Payer: Self-pay | Admitting: Medical

## 2023-11-23 ENCOUNTER — Other Ambulatory Visit: Payer: Self-pay

## 2023-11-23 VITALS — BP 196/80 | HR 80 | Temp 98.3°F | Ht 62.0 in | Wt 240.0 lb

## 2023-11-23 DIAGNOSIS — H6992 Unspecified Eustachian tube disorder, left ear: Secondary | ICD-10-CM

## 2023-11-23 DIAGNOSIS — I10 Essential (primary) hypertension: Secondary | ICD-10-CM

## 2023-11-23 DIAGNOSIS — R7309 Other abnormal glucose: Secondary | ICD-10-CM | POA: Diagnosis not present

## 2023-11-23 NOTE — Patient Instructions (Addendum)
-  Try increasing Nasacort  to twice a day for the next 7 days. -Try adding a dose of Claritin/Loratadine in the evening. Continue taking Allegra in the morning. -I recommend avoiding decongestants (such as Mucinex D or Sudafed), as they will raise your blood pressure.  -If your ear pressure/congestion is not improving over the next 5-7 days with the Nasacort  and Claritin, you may try a steroid (Prednisone ). -You mentioned having 20 mg Prednisone  tablets at home. You may take 20 mg Prednisone  once a day with food (ideally in the morning and not close to bedtime) for 5 days.  -Consider asking the nurse to recheck your blood pressure when you go in for your B12 shot at your primary care office later this month. You may also return to the [facility] for a blood pressure check if you prefer.

## 2023-11-23 NOTE — Progress Notes (Signed)
 [facility]rap[facility] [address] [place], [place] [address]   Office Visit Note  Patient Name: Bridget Reed Date of Birth [id number]  Medical Record number [id number]  Date of Service: 11/23/2023  Chief Complaint  Patient presents with   Left ear discomfort    Patient reports feeling L ear fullness for the last several weeks. She has been taking Mucinex.     HPI 63 YO female presents with left ear fullness/congestion.  Has noted fullness and clogged feeling to left ear for last 2 weeks. Congestion sometimes clears slightly when blows nose. Slight pain to left ear occasionally when blows nose. No fever or chills. Some maxillary sinus pressure in last week. No runny nose, nose a little crusty. Small clear to light yellow mucus if she blows nose. No sore throat, headache or cough.  Hx of ear infections, not in last 10 years. Uses Nasacort  every AM and takes Allegra daily for allergies. Recalls she developed skin itching, runny nose and congestion when caring for friend's cat briefly few weeks ago.   Has been using Mucinex D every other day, helps briefly but raises her blood pressure. Did take blood pressure medicine today.   No recent swimming.   Patient suspects BP may be elevated in part due to encounter with angry parent this morning. Denies chest pain, shortness of breath or vision change.   Current Medication:  Outpatient Encounter Medications as of 11/23/2023  Medication Sig   Betamethasone  Dipropionate (SERNIVO ) 0.05 % EMUL Apply 1 Application topically as directed. qd to bid aa eczema on body, avoid face, groin, axilla   fexofenadine (ALLEGRA) 60 MG tablet Take 60 mg by mouth daily.   fluconazole  (DIFLUCAN ) 150 MG tablet Take 1 tablet (150 mg total) by mouth as directed. 1 po q 2 weeks   furosemide  (LASIX ) 20 MG tablet TAKE ONE TABLET BY MOUTH AS NEEDED FOR EDEMA   hydrochlorothiazide  (HYDRODIURIL ) 25 MG tablet TAKE 1 TABLET (25 MG TOTAL) BY MOUTH DAILY.   hydrOXYzine   (ATARAX ) 25 MG tablet Take 1 tablet (25 mg total) by mouth at bedtime as needed.   ketoconazole  (NIZORAL ) 2 % cream qd to itchy areas on body   Levothyroxine  Sodium (TIROSINT ) 100 MCG CAPS Take 1 capsule (100 mcg total) by mouth daily before breakfast.   omeprazole  (PRILOSEC) 40 MG capsule TAKE 1 CAPSULE BY MOUTH DAILY   tacrolimus  (PROTOPIC ) 0.1 % ointment Apply topically as directed. qd to bid aa eczema on face and body   valsartan  (DIOVAN ) 160 MG tablet TAKE 1 TABLET BY MOUTH EVERY DAY   tirzepatide  (ZEPBOUND ) 2.5 MG/0.5ML Pen Inject 2.5 mg into the skin once a week. Inject 0.25 mg [place] weekly for four weeks, then increase to 0.5 mg weekly (Patient not taking: Reported on 11/23/2023)   tirzepatide  (ZEPBOUND ) 5 MG/0.5ML Pen Inject 5 mg into the skin once a week. Inject 0.5 mg  weekly (Patient not taking: Reported on 11/23/2023)   tirzepatide  (ZEPBOUND ) 7.5 MG/0.5ML Pen Inject 7.5 mg into the skin once a week. (Patient not taking: Reported on 11/23/2023)   No facility-administered encounter medications on file as of 11/23/2023.      Medical History: Past Medical History:  Diagnosis Date   Allergy     Basal cell carcinoma 06/17/2017   right chest    GERD (gastroesophageal reflux disease)    Hypertension    Left tennis elbow    Morbid obesity (HCC) 07/13/2016     Vital Signs: BP (!) 196/80  Pulse 80   Temp 98.3 F (36.8 C)   Ht 5' 2 (1.575 m)   Wt 240 lb (108.9 kg)   LMP 11/22/2013 (Approximate)   SpO2 97%   BMI 43.90 kg/m    Review of Systems  Constitutional:  Negative for chills and fever.  HENT:  Positive for congestion, ear pain (occasional when blowing nose) and sinus pressure. Negative for ear discharge, hearing loss, rhinorrhea, sinus pain and sore throat.   Eyes:  Negative for visual disturbance.  Respiratory:  Negative for cough and shortness of breath.   Cardiovascular:  Negative for chest pain.    Physical Exam Vitals reviewed.  Constitutional:      General: She  is not in acute distress.    Appearance: She is not ill-appearing.  HENT:     Head: Normocephalic and atraumatic.     Right Ear: Hearing, ear canal and external ear normal.     Left Ear: Hearing, ear canal and external ear normal.     Ears:     Comments: Small nonobstructive cerumen to left EAC. Left TM dull with moderate serous middle ear fluid. Right TM normal. No erythema or bulging to TM bilaterally.     Nose: Mucosal edema (mild) and congestion (mild) present. No rhinorrhea.     Right Sinus: No frontal sinus tenderness.     Left Sinus: No frontal sinus tenderness.     Comments: Mild pressure with palpation over maxillary sinuses.    Mouth/Throat:     Mouth: Mucous membranes are moist.     Pharynx: Oropharynx is clear.   Musculoskeletal:     Cervical back: Neck supple. No rigidity.  Lymphadenopathy:     Cervical: No cervical adenopathy.   Neurological:     Mental Status: She is alert.      Assessment/Plan: 1. Eustachian tube disorder, left (Primary) No clinical evidence of otitis media/externa. Middle ear fluid, congestion and fullness suggestive of eustachian tube dysfunction. Discussed increasing Nasacort  to BID, adding 10 mg Claritin/Loratadine in evening in addition to Allegra in AM. Avoid decongestants given elevated blood pressure.  Discussed option for oral steroid if sx not improving. Patient states she has supply of Prednisone  20 mg at home, does not like taking steroids due to increased hunger and irritability it causes. If sx not improving with increased use of Nasacort /addition of Loratadine, patient may try 20 mg Prednisone  once daily with food x 5 days.  2. Essential hypertension BP elevated today in setting of recent use of decongestants. Also reports some increased work-related stress today. Denies sx concerning for hypertensive urgency/emergency. Patient encouraged to continue blood pressure medications as prescribed. Advised to avoid use of decongestants.  Consider having BP rechecked at PCP office when there for scheduled nurse visit/B12 injection on 12/02/23.   Patient Instructions  -Try increasing Nasacort  to twice a day for the next 7 days. -Try adding a dose of Claritin/Loratadine in the evening. Continue taking Allegra in the morning. -I recommend avoiding decongestants (such as Mucinex D or Sudafed), as they will raise your blood pressure.  -If your ear pressure/congestion is not improving over the next 5-7 days with the Nasacort  and Claritin, you may try a steroid (Prednisone ). -You mentioned having 20 mg Prednisone  tablets at home. You may take 20 mg Prednisone  once a day with food (ideally in the morning and not close to bedtime) for 5 days.  -Consider asking the nurse to recheck your blood pressure when you go in for your B12 shot at  your primary care office later this month. You may also return to the [facility] for a blood pressure check if you prefer.    General Counseling: jermia rigsby understanding of the findings of todays visit and plan of treatment. she has been encouraged to call the office with any questions or concerns that should arise related to todays visit.   Time spent:20 Minutes   Joen Arts PA-C Physician Assistant

## 2023-12-02 ENCOUNTER — Ambulatory Visit

## 2023-12-02 DIAGNOSIS — E538 Deficiency of other specified B group vitamins: Secondary | ICD-10-CM

## 2023-12-02 MED ORDER — CYANOCOBALAMIN 1000 MCG/ML IJ SOLN
1000.0000 ug | Freq: Once | INTRAMUSCULAR | Status: AC
  Administered 2023-12-02: 1000 ug via INTRAMUSCULAR

## 2023-12-02 NOTE — Progress Notes (Signed)
 Per orders of Dr. Herby Lolling, injection of B-12 given by Kris Pester in right deltoid. Patient tolerated injection well.

## 2023-12-06 ENCOUNTER — Other Ambulatory Visit (HOSPITAL_COMMUNITY): Payer: Self-pay

## 2023-12-06 ENCOUNTER — Telehealth: Payer: Self-pay

## 2023-12-06 NOTE — Telephone Encounter (Signed)
 Pharmacy Patient Advocate Encounter  Received notification from [facility] that Prior Authorization for Levothyroxine  Sodium capsules  has been APPROVED from 12/06/23 to 12/05/24   PA #/Case ID/Reference #: [id number]

## 2023-12-06 NOTE — Telephone Encounter (Signed)
 Pharmacy Patient Advocate Encounter   Received notification from CoverMyMeds that prior authorization for Levothyroxine  Sodium 100MCG capsules  is required/requested.   Insurance verification completed.   The patient is insured through [facility] .   Per test claim: PA required; PA submitted to above mentioned insurance via CoverMyMeds Key/confirmation #/EOC [id number] Status is pending

## 2023-12-18 ENCOUNTER — Other Ambulatory Visit: Payer: Self-pay | Admitting: Family

## 2023-12-18 DIAGNOSIS — E039 Hypothyroidism, unspecified: Secondary | ICD-10-CM

## 2023-12-24 DIAGNOSIS — R7309 Other abnormal glucose: Secondary | ICD-10-CM | POA: Diagnosis not present

## 2023-12-28 ENCOUNTER — Encounter: Admitting: Dermatology

## 2023-12-30 ENCOUNTER — Ambulatory Visit: Admitting: Dermatology

## 2023-12-30 ENCOUNTER — Encounter: Payer: Self-pay | Admitting: Dermatology

## 2023-12-30 ENCOUNTER — Other Ambulatory Visit: Payer: Self-pay | Admitting: Dermatology

## 2023-12-30 DIAGNOSIS — L820 Inflamed seborrheic keratosis: Secondary | ICD-10-CM | POA: Diagnosis not present

## 2023-12-30 DIAGNOSIS — W908XXA Exposure to other nonionizing radiation, initial encounter: Secondary | ICD-10-CM | POA: Diagnosis not present

## 2023-12-30 DIAGNOSIS — L578 Other skin changes due to chronic exposure to nonionizing radiation: Secondary | ICD-10-CM

## 2023-12-30 DIAGNOSIS — D1801 Hemangioma of skin and subcutaneous tissue: Secondary | ICD-10-CM | POA: Diagnosis not present

## 2023-12-30 DIAGNOSIS — Z1283 Encounter for screening for malignant neoplasm of skin: Secondary | ICD-10-CM | POA: Diagnosis not present

## 2023-12-30 DIAGNOSIS — Z85828 Personal history of other malignant neoplasm of skin: Secondary | ICD-10-CM

## 2023-12-30 DIAGNOSIS — D2371 Other benign neoplasm of skin of right lower limb, including hip: Secondary | ICD-10-CM

## 2023-12-30 DIAGNOSIS — L304 Erythema intertrigo: Secondary | ICD-10-CM | POA: Diagnosis not present

## 2023-12-30 DIAGNOSIS — D229 Melanocytic nevi, unspecified: Secondary | ICD-10-CM

## 2023-12-30 DIAGNOSIS — D239 Other benign neoplasm of skin, unspecified: Secondary | ICD-10-CM

## 2023-12-30 DIAGNOSIS — L2081 Atopic neurodermatitis: Secondary | ICD-10-CM

## 2023-12-30 DIAGNOSIS — L209 Atopic dermatitis, unspecified: Secondary | ICD-10-CM

## 2023-12-30 MED ORDER — KETOCONAZOLE 2 % EX CREA: TOPICAL_CREAM | CUTANEOUS | 5 refills | Status: DC

## 2023-12-30 MED ORDER — TACROLIMUS 0.1 % EX OINT: TOPICAL_OINTMENT | CUTANEOUS | 2 refills | Status: AC

## 2023-12-30 MED ORDER — FLUCONAZOLE 150 MG PO TABS: 150.0000 mg | ORAL_TABLET | ORAL | 0 refills | Status: DC

## 2023-12-30 MED ORDER — SERNIVO 0.05 % EX EMUL: 1.0000 | CUTANEOUS | 2 refills | Status: AC

## 2023-12-30 NOTE — Patient Instructions (Signed)

## 2023-12-30 NOTE — Progress Notes (Signed)
 Follow-Up Visit   Subjective  Bridget Reed is a 63 y.o. female who presents for the following: Skin Cancer Screening and Full Body Skin Exam  The patient presents for Total-Body Skin Exam (TBSE) for skin cancer screening and mole check. The patient has spots, moles and lesions to be evaluated, some may be new or changing and the patient may have concern these could be cancer. Itchy bumps on chest.  Pt with hx of BCC. Hx of Atopic Dermatitis, using Tacrolimus , Sernivo  spray, ketoconazole  cream and taking Diflucan  150 mg 1 po 2 weeks. Also uses Opzelura  for more severe areas. Too many side effects on Dupixent  so stopped it, currently well-controlled with topical regimen and Diflucan .  The following portions of the chart were reviewed this encounter and updated as appropriate: medications, allergies, medical history  Review of Systems:  No other skin or systemic complaints except as noted in HPI or Assessment and Plan.  Objective  Well appearing patient in no apparent distress; mood and affect are within normal limits.  A full examination was performed including scalp, head, eyes, ears, nose, lips, neck, chest, axillae, abdomen, back, buttocks, bilateral upper extremities, bilateral lower extremities, hands, feet, fingers, toes, fingernails, and toenails. All findings within normal limits unless otherwise noted below.   Relevant physical exam findings are noted in the Assessment and Plan.  R chest x 1, L chest x 1, mid chest x 1 (3) Erythematous stuck-on, waxy papule  Assessment & Plan   SKIN CANCER SCREENING PERFORMED TODAY.  ACTINIC DAMAGE - Chronic condition, secondary to cumulative UV/sun exposure - diffuse scaly erythematous macules with underlying dyspigmentation - Recommend daily broad spectrum sunscreen SPF 30+ to sun-exposed areas, reapply every 2 hours as needed.  - Staying in the shade or wearing long sleeves, sun glasses (UVA+UVB protection) and wide brim hats (4-inch  brim around the entire circumference of the hat) are also recommended for sun protection.  - Call for new or changing lesions.  LENTIGINES, SEBORRHEIC KERATOSES, HEMANGIOMAS - Benign normal skin lesions - Benign-appearing - Call for any changes  MELANOCYTIC NEVI - Tan-brown and/or pink-flesh-colored symmetric macules and papules - Benign appearing on exam today - Observation - Call clinic for new or changing moles - Recommend daily use of broad spectrum spf 30+ sunscreen to sun-exposed areas.   HISTORY OF BASAL CELL CARCINOMA OF THE SKIN - right chest 2019 - No evidence of recurrence today - Recommend regular full body skin exams - Recommend daily broad spectrum sunscreen SPF 30+ to sun-exposed areas, reapply every 2 hours as needed.  - Call if any new or changing lesions are noted between office visits  ATOPIC DERMATITIS with INTERTRIGO Exam: Pink excoriated patches at lower back, pink patch at left temple 2% BSA  Chronic and persistent condition with duration or expected duration over one year. Condition is improving with treatment but not currently at goal.   Atopic dermatitis (eczema) is a chronic, relapsing, pruritic condition that can significantly affect quality of life. It is often associated with allergic rhinitis and/or asthma and can require treatment with topical medications, phototherapy, or in severe cases biologic injectable medication (Dupixent ; Adbry) or Oral JAK inhibitors.  Treatment Plan: Discussed Ebglyss vs Adbry. Patient pleased with current treatment plan and wants to continue with topical treatment.  Continue Diflucan  150 mg 1 po qwk - will send in as 1 po qwk # 12 for 3 month supply.  Continue Tacrolimus  0.1% oint qd/bid aa eczema body Continue Sernivo  spray qd/bid aa  eczema on body, avoid f/g/a  Cont Ketoconazole  2% cr qd aa rash body folds  Recommend gentle skin care.  DERMATOFIBROMA Exam: Firm pink/brown papulenodule with dimple sign at right foot  dorsum Treatment Plan: A dermatofibroma is a benign growth possibly related to trauma, such as an insect bite, cut from shaving, or inflamed acne-type bump.  Treatment options to remove include shave or excision with resulting scar and risk of recurrence.  Since benign-appearing and not bothersome, will observe for now.     ERYTHEMA INTERTRIGO   Related Medications ketoconazole  (NIZORAL ) 2 % cream qd to itchy areas on body INFLAMED SEBORRHEIC KERATOSIS (3) R chest x 1, L chest x 1, mid chest x 1 (3) Symptomatic, irritating, patient would like treated.  Benign-appearing.  Call clinic for new or changing lesions.   Destruction of lesion - R chest x 1, L chest x 1, mid chest x 1 (3)  Destruction method: cryotherapy   Informed consent: discussed and consent obtained   Lesion destroyed using liquid nitrogen: Yes   Region frozen until ice ball extended beyond lesion: Yes   Outcome: patient tolerated procedure well with no complications   Post-procedure details: wound care instructions given   Additional details:  Prior to procedure, discussed risks of blister formation, small wound, skin dyspigmentation, or rare scar following cryotherapy. Recommend Vaseline ointment to treated areas while healing.   Return in about 1 year (around 12/29/2024) for TBSE, with Dr. Jackquline, Atopic Dermatitis, H[facility]CC.  LILLETTE Lonell Drones, RMA, am acting as scribe for Rexene Jackquline, MD .   Documentation: I have reviewed the above documentation for accuracy and completeness, and I agree with the above.  Rexene Jackquline, MD

## 2024-01-04 ENCOUNTER — Other Ambulatory Visit: Payer: Self-pay

## 2024-01-04 ENCOUNTER — Encounter: Payer: Self-pay | Admitting: Dermatology

## 2024-01-04 MED ORDER — FLUCONAZOLE 150 MG PO TABS
150.0000 mg | ORAL_TABLET | ORAL | 0 refills | Status: DC
Start: 2024-01-04 — End: 2024-03-07

## 2024-01-04 NOTE — Progress Notes (Unsigned)
 Resent Fluconazole  b/c original script from 01/03/24 failed./sh

## 2024-01-23 ENCOUNTER — Other Ambulatory Visit: Payer: Self-pay | Admitting: Family

## 2024-01-23 DIAGNOSIS — I10 Essential (primary) hypertension: Secondary | ICD-10-CM

## 2024-01-23 DIAGNOSIS — R600 Localized edema: Secondary | ICD-10-CM

## 2024-03-07 ENCOUNTER — Ambulatory Visit (INDEPENDENT_AMBULATORY_CARE_PROVIDER_SITE_OTHER): Admitting: Family

## 2024-03-07 ENCOUNTER — Encounter: Payer: Self-pay | Admitting: Family

## 2024-03-07 VITALS — BP 160/80 | HR 90 | Temp 98.4°F | Ht 62.0 in | Wt 252.8 lb

## 2024-03-07 DIAGNOSIS — E039 Hypothyroidism, unspecified: Secondary | ICD-10-CM | POA: Diagnosis not present

## 2024-03-07 DIAGNOSIS — Z23 Encounter for immunization: Secondary | ICD-10-CM

## 2024-03-07 DIAGNOSIS — R7303 Prediabetes: Secondary | ICD-10-CM | POA: Diagnosis not present

## 2024-03-07 DIAGNOSIS — Z0000 Encounter for general adult medical examination without abnormal findings: Secondary | ICD-10-CM

## 2024-03-07 DIAGNOSIS — E559 Vitamin D deficiency, unspecified: Secondary | ICD-10-CM

## 2024-03-07 DIAGNOSIS — I10 Essential (primary) hypertension: Secondary | ICD-10-CM | POA: Diagnosis not present

## 2024-03-07 DIAGNOSIS — Z1231 Encounter for screening mammogram for malignant neoplasm of breast: Secondary | ICD-10-CM

## 2024-03-07 DIAGNOSIS — Z6841 Body Mass Index (BMI) 40.0 and over, adult: Secondary | ICD-10-CM

## 2024-03-07 LAB — CBC
HCT: 46.5 % — ABNORMAL HIGH (ref 36.0–46.0)
Hemoglobin: 15.6 g/dL — ABNORMAL HIGH (ref 12.0–15.0)
MCHC: 33.6 g/dL (ref 30.0–36.0)
MCV: 89.5 fl (ref 78.0–100.0)
Platelets: 274 K/uL (ref 150.0–400.0)
RBC: 5.2 Mil/uL — ABNORMAL HIGH (ref 3.87–5.11)
RDW: 13.9 % (ref 11.5–15.5)
WBC: 11.7 K/uL — ABNORMAL HIGH (ref 4.0–10.5)

## 2024-03-07 LAB — LIPID PANEL
Cholesterol: 187 mg/dL (ref 0–200)
HDL: 54.4 mg/dL (ref >39.00)
LDL Cholesterol: 108 mg/dL — ABNORMAL HIGH (ref 0–99)
NonHDL: 132.91
Total CHOL/HDL Ratio: 3
Triglycerides: 126 mg/dL (ref 0.0–149.0)
VLDL: 25.2 mg/dL (ref 0.0–40.0)

## 2024-03-07 LAB — BASIC METABOLIC PANEL WITH GFR
BUN: 15 mg/dL (ref 6–23)
CO2: 29 meq/L (ref 19–32)
Calcium: 9.5 mg/dL (ref 8.4–10.5)
Chloride: 97 meq/L (ref 96–112)
Creatinine, Ser: 0.66 mg/dL (ref 0.40–1.20)
GFR: 94.18 mL/min (ref >60.00)
Glucose, Bld: 134 mg/dL — ABNORMAL HIGH (ref 70–99)
Potassium: 3.5 meq/L (ref 3.5–5.1)
Sodium: 135 meq/L (ref 135–145)

## 2024-03-07 LAB — COMPREHENSIVE METABOLIC PANEL WITH GFR
ALT: 29 U/L (ref 0–35)
AST: 27 U/L (ref 0–37)
Albumin: 4.4 g/dL (ref 3.5–5.2)
Alkaline Phosphatase: 87 U/L (ref 39–117)
BUN: 15 mg/dL (ref 6–23)
CO2: 29 meq/L (ref 19–32)
Calcium: 9.5 mg/dL (ref 8.4–10.5)
Chloride: 97 meq/L (ref 96–112)
Creatinine, Ser: 0.66 mg/dL (ref 0.40–1.20)
GFR: 94.18 mL/min (ref >60.00)
Glucose, Bld: 134 mg/dL — ABNORMAL HIGH (ref 70–99)
Potassium: 3.5 meq/L (ref 3.5–5.1)
Sodium: 135 meq/L (ref 135–145)
Total Bilirubin: 0.9 mg/dL (ref 0.2–1.2)
Total Protein: 7.2 g/dL (ref 6.0–8.3)

## 2024-03-07 LAB — HEMOGLOBIN A1C: Hgb A1c MFr Bld: 5.7 % (ref 4.6–6.5)

## 2024-03-07 NOTE — Progress Notes (Signed)
 Subjective:  Patient ID: Bridget Reed, female    DOB: July 21, 1960  Age: 63 y.o. MRN: [id number]  Patient Care Team: Corwin Antu, FNP as PCP - General (Family Medicine)   CC:  Chief Complaint  Patient presents with   Annual Exam    HPI Bridget Reed is a 63 y.o. female who presents today for an annual physical exam. She reports consuming a general diet. The patient does not participate in regular exercise at present. She generally feels well. She reports sleeping well. She does not have additional problems to discuss today.   Vision:Within last year Dental:Receives regular dental care  Mammogram: 04/16/23 Last pap: 07/18/19 negative every 5 years  Colonoscopy: 03/25/23 every five years   Pt is with acute concerns.   Discussed the use of AI scribe software for clinical note transcription with the patient, who gave verbal consent to proceed.  History of Present Illness Bridget Reed is a 63 year old female with hypothyroidism who presents for a routine physical exam and thyroid  medication management.  She feels overall well with no significant concerns but wishes to switch back to her original thyroid  medication due to the high cost of her current medication, Tyrosine, which costs $140 for a three-month supply.  She previously experienced itching while on the original medication, but it was later determined to be due to a yeast infection rather than the medication itself.  She is currently taking Tyrosine and has about three weeks' supply remaining.  She recalls being on a low dose of levothyroxine  in the past, which required gradual increases. She does not remember the specific dose but notes that the yellow dye in the 100 mcg tablets of levothyroxine  may have caused issues for her.  She has been tested for hepatitis in the past to check for immunity but has never been tested for HIV. She prefers not to undergo HIV testing at this time, despite chronic pruritus  being a potential reason for screening.  She takes B12 supplements a couple of times a week and has previously received B12 shots. She follows a sugar-free diet, which she believes helps with her yeast issues.   Advanced Directives Patient does not have advanced directives    DEPRESSION SCREENING    03/07/2024    9:16 AM 09/27/2023    8:03 AM 09/23/2022   11:26 AM 06/15/2022    8:22 AM 03/10/2022    8:26 AM 03/05/2020    1:05 PM 02/14/2020   10:43 AM  PHQ 2/9 Scores  PHQ - 2 Score 0 0 0 0 0 0 0  PHQ- 9 Score 0 0  0        ROS: Negative unless specifically indicated above in HPI.    Current Outpatient Medications:    Betamethasone  Dipropionate (SERNIVO ) 0.05 % EMUL, Apply 1 Application topically as directed. qd to bid aa eczema on body, avoid face, groin, axilla, Disp: 120 mL, Rfl: 2   fexofenadine (ALLEGRA) 60 MG tablet, Take 60 mg by mouth daily., Disp: , Rfl:    furosemide  (LASIX ) 20 MG tablet, TAKE ONE TABLET BY MOUTH AS NEEDED FOR EDEMA, Disp: 30 tablet, Rfl: 0   hydrochlorothiazide  (HYDRODIURIL ) 25 MG tablet, TAKE 1 TABLET BY MOUTH DAILY, Disp: 90 tablet, Rfl: 3   hydrOXYzine  (ATARAX ) 25 MG tablet, Take 1 tablet (25 mg total) by mouth at bedtime as needed., Disp: 30 tablet, Rfl: 5   ketoconazole  (NIZORAL ) 2 % cream, qd to itchy areas on body, Disp: 60  g, Rfl: 5   omeprazole  (PRILOSEC) 40 MG capsule, TAKE 1 CAPSULE BY MOUTH DAILY, Disp: 90 capsule, Rfl: 3   tacrolimus  (PROTOPIC ) 0.1 % ointment, Apply topically as directed. qd to bid aa eczema on face and body, Disp: 100 g, Rfl: 2   TIROSINT  100 MCG CAPS, TAKE 1 CAPSULE BY MOUTH DAILY  BEFORE BREAKFAST, Disp: 90 capsule, Rfl: 3   valsartan  (DIOVAN ) 160 MG tablet, TAKE 1 TABLET BY MOUTH DAILY, Disp: 90 tablet, Rfl: 3    Objective:    BP (!) 160/80   Pulse 90   Temp 98.4 F (36.9 C) (Temporal)   Ht 5' 2 (1.575 m)   Wt 252 lb 12.8 oz (114.7 kg)   LMP 11/22/2013 (Approximate)   SpO2 95%   BMI 46.24 kg/m   BP Readings  from Last 3 Encounters:  03/07/24 (!) 160/80  11/23/23 (!) 196/80  09/27/23 130/86      Physical Exam Vitals reviewed.  Constitutional:      General: She is not in acute distress.    Appearance: Normal appearance. She is obese. She is not ill-appearing.  HENT:     Head: Normocephalic.     Right Ear: Tympanic membrane normal.     Left Ear: Tympanic membrane normal.     Nose: Nose normal.     Mouth/Throat:     Mouth: Mucous membranes are moist.  Eyes:     Extraocular Movements: Extraocular movements intact.     Pupils: Pupils are equal, round, and reactive to light.  Cardiovascular:     Rate and Rhythm: Normal rate and regular rhythm.  Pulmonary:     Effort: Pulmonary effort is normal.     Breath sounds: Normal breath sounds.  Abdominal:     General: Abdomen is flat. Bowel sounds are normal.     Palpations: Abdomen is soft.     Tenderness: There is no guarding or rebound.  Musculoskeletal:        General: Normal range of motion.     Cervical back: Normal range of motion.  Skin:    General: Skin is warm.     Capillary Refill: Capillary refill takes less than 2 seconds.  Neurological:     General: No focal deficit present.     Mental Status: She is alert.  Psychiatric:        Mood and Affect: Mood normal.        Behavior: Behavior normal.        Thought Content: Thought content normal.        Judgment: Judgment normal.      Results       Assessment & Plan:   Assessment and Plan Assessment & Plan Adult Wellness Visit Reports feeling well overall with no significant concerns. - Order basic labs including thyroid  function tests and A1c - Order mammogram - Administer pneumonia vaccine -Patient Counseling(The following topics were reviewed):  Preventative care handout given to pt  Health maintenance and immunizations reviewed. Please refer to Health maintenance section. Pt advised on safe sex, wearing seatbelts in car, and proper nutrition labwork ordered  today for annual Dental health: Discussed importance of regular tooth brushing, flossing, and dental visits.   Acquired hypothyroidism Currently on Tirosint  100 mcg daily. Desires to switch back to levothyroxine  due to cost concerns. Previous issues with itching attributed to yeast, not medication. Thyroid  levels elevated five months ago. Goal TSH for hypothyroidism is between 0.5 and 2. Discussed potential issues with yellow dye in levothyroxine   100 mcg tablets and alternative dosing strategies if needed. - Continue Tirosint  100 mcg daily - Order thyroid  function tests - pending labs, if at goal will switch tirosint  to levothyroxine  due to cost, pt suspects hives were brought on from candidal overgrowth of which is currently being treated then the levothyroxine .  - Consider adjusting dose based on thyroid  function test results  Prediabetes No specific discussion regarding prediabetes management in this encounter. - Order A1c to monitor glucose levels  General Health Maintenance Routine health maintenance discussed. Declines HIV screening despite recommendation for routine screening at least once in a lifetime. B12 supplementation ongoing with a couple of doses per week. Mindful of sugar intake, beneficial for yeast management. - Administer pneumonia vaccine - Order mammogram - Declines HIV screening - Continue B12 supplementation as currently practiced        Follow-up: Return in about 6 months (around 09/05/2024) for f/u blood pressure.   Ginger Patrick, FNP

## 2024-03-07 NOTE — Patient Instructions (Signed)
  I have sent an electronic order over to your preferred location for the following:   []   2D Mammogram  [x]   3D Mammogram  []   Bone Density   Please give this center a call to get scheduled at your convenience.  [x]   [facility]  [address] [place] [address]  [phone]  Make sure to wear two piece  clothing  No lotions powders or deodorants the day of the appointment Make sure to bring picture ID and insurance card.  Bring list of medications you are currently taking including any supplements.   ------------------------------------

## 2024-03-08 ENCOUNTER — Ambulatory Visit: Payer: Self-pay | Admitting: Family

## 2024-03-08 DIAGNOSIS — I10 Essential (primary) hypertension: Secondary | ICD-10-CM

## 2024-03-08 DIAGNOSIS — E039 Hypothyroidism, unspecified: Secondary | ICD-10-CM

## 2024-03-08 DIAGNOSIS — E782 Mixed hyperlipidemia: Secondary | ICD-10-CM

## 2024-03-14 MED ORDER — LEVOTHYROXINE SODIUM 112 MCG PO TABS: 112.0000 ug | ORAL_TABLET | Freq: Every day | ORAL | 1 refills | Status: AC

## 2024-03-16 ENCOUNTER — Ambulatory Visit (INDEPENDENT_AMBULATORY_CARE_PROVIDER_SITE_OTHER): Payer: Self-pay | Admitting: Adult Health

## 2024-03-16 ENCOUNTER — Other Ambulatory Visit: Payer: Self-pay

## 2024-03-16 ENCOUNTER — Encounter: Payer: Self-pay | Admitting: Adult Health

## 2024-03-16 VITALS — BP 150/84 | HR 69 | Temp 97.2°F | Ht 62.0 in | Wt 252.0 lb

## 2024-03-16 DIAGNOSIS — M25561 Pain in right knee: Secondary | ICD-10-CM

## 2024-03-16 MED ORDER — IBUPROFEN 600 MG PO TABS
600.0000 mg | ORAL_TABLET | Freq: Three times a day (TID) | ORAL | 0 refills | Status: AC | PRN
Start: 2024-03-16 — End: ?

## 2024-03-16 NOTE — Progress Notes (Signed)
 [facility] [address] [place], [place] [address]   Office Visit Note  Patient Name: Bridget Reed Date of Birth [id number]  Medical Record number [id number]  Date of Service: 03/16/2024  Chief Complaint  Patient presents with   Knee Pain    Patient reports having R knee pain for the last several weeks. She has been taking Aleve . No known injury.     Knee Pain    Pt is here for a sick visit. She reports inside right knee pain.  She notices it more when she wears certain shoes.  She has continued to have Left upper leg pain/weakness which has made her gait change.    Current Medication:  Outpatient Encounter Medications as of 03/16/2024  Medication Sig   Betamethasone  Dipropionate (SERNIVO ) 0.05 % EMUL Apply 1 Application topically as directed. qd to bid aa eczema on body, avoid face, groin, axilla   fexofenadine (ALLEGRA) 60 MG tablet Take 60 mg by mouth daily.   furosemide  (LASIX ) 20 MG tablet TAKE ONE TABLET BY MOUTH AS NEEDED FOR EDEMA   hydrochlorothiazide  (HYDRODIURIL ) 25 MG tablet TAKE 1 TABLET BY MOUTH DAILY   hydrOXYzine  (ATARAX ) 25 MG tablet Take 1 tablet (25 mg total) by mouth at bedtime as needed.   ibuprofen  (ADVIL ) 600 MG tablet Take 1 tablet (600 mg total) by mouth every 8 (eight) hours as needed.   ketoconazole  (NIZORAL ) 2 % cream qd to itchy areas on body   levothyroxine  (SYNTHROID ) 112 MCG tablet Take 1 tablet (112 mcg total) by mouth daily.   omeprazole  (PRILOSEC) 40 MG capsule TAKE 1 CAPSULE BY MOUTH DAILY   tacrolimus  (PROTOPIC ) 0.1 % ointment Apply topically as directed. qd to bid aa eczema on face and body   valsartan  (DIOVAN ) 160 MG tablet TAKE 1 TABLET BY MOUTH DAILY   No facility-administered encounter medications on file as of 03/16/2024.      Medical History: Past Medical History:  Diagnosis Date   Allergy     Basal cell carcinoma 06/17/2017   right chest    GERD (gastroesophageal reflux disease)    Hypertension    Left  tennis elbow    Morbid obesity (HCC) 07/13/2016     Vital Signs: BP (!) 150/84   Pulse 69   Temp (!) 97.2 F (36.2 C)   Ht 5' 2 (1.575 m)   Wt 252 lb (114.3 kg)   LMP 11/22/2013 (Approximate)   SpO2 98%   BMI 46.09 kg/m    Review of Systems  Constitutional:  Negative for chills, fatigue and fever.  HENT:  Negative for congestion.   Eyes:  Negative for pain and itching.  Musculoskeletal:  Positive for arthralgias.    Physical Exam Vitals reviewed.  Musculoskeletal:     Comments: Right knee, medial aspect, pain with palpation.  Sitting to standing hurts more.     Assessment/Plan: 1. Acute pain of right knee (Primary) Take NSAID as discussed. Follow up as discussed.  - ibuprofen  (ADVIL ) 600 MG tablet; Take 1 tablet (600 mg total) by mouth every 8 (eight) hours as needed.  Dispense: 30 tablet; Refill: 0        General Counseling: Bridget Reed understanding of the findings of todays visit and agrees with plan of treatment. I have discussed any further diagnostic evaluation that may be needed or ordered today. We also reviewed her medications today. she has been encouraged to call the office with any questions or concerns that should arise related to todays visit.  No orders of the defined types were placed in this encounter.   Meds ordered this encounter  Medications   ibuprofen  (ADVIL ) 600 MG tablet    Sig: Take 1 tablet (600 mg total) by mouth every 8 (eight) hours as needed.    Dispense:  30 tablet    Refill:  0    Time spent:15 Minutes    Bridget Reed AGNP-C Nurse Practitioner

## 2024-03-24 ENCOUNTER — Other Ambulatory Visit: Payer: Self-pay | Admitting: Adult Health

## 2024-03-24 ENCOUNTER — Encounter: Payer: Self-pay | Admitting: Adult Health

## 2024-03-24 DIAGNOSIS — M25561 Pain in right knee: Secondary | ICD-10-CM

## 2024-03-29 ENCOUNTER — Encounter: Admitting: Family

## 2024-03-30 DIAGNOSIS — M1711 Unilateral primary osteoarthritis, right knee: Secondary | ICD-10-CM | POA: Diagnosis not present

## 2024-04-18 ENCOUNTER — Ambulatory Visit
Admission: RE | Admit: 2024-04-18 | Discharge: 2024-04-18 | Disposition: A | Discharge status: Home / Self Care | Source: Ambulatory Visit | Attending: Family | Admitting: Family

## 2024-04-18 DIAGNOSIS — Z1231 Encounter for screening mammogram for malignant neoplasm of breast: Secondary | ICD-10-CM | POA: Insufficient documentation

## 2024-04-24 ENCOUNTER — Encounter: Payer: Self-pay | Admitting: Medical

## 2024-04-24 ENCOUNTER — Ambulatory Visit: Payer: Self-pay | Admitting: Medical

## 2024-04-24 ENCOUNTER — Other Ambulatory Visit: Payer: Self-pay

## 2024-04-24 VITALS — BP 172/80 | HR 91 | Temp 98.1°F | Ht 61.0 in | Wt 240.0 lb

## 2024-04-24 DIAGNOSIS — J069 Acute upper respiratory infection, unspecified: Secondary | ICD-10-CM

## 2024-04-24 DIAGNOSIS — R051 Acute cough: Secondary | ICD-10-CM

## 2024-04-24 DIAGNOSIS — H6993 Unspecified Eustachian tube disorder, bilateral: Secondary | ICD-10-CM

## 2024-04-24 MED ORDER — PREDNISONE 10 MG PO TABS: ORAL_TABLET | ORAL | 0 refills | Status: AC

## 2024-04-24 NOTE — Progress Notes (Signed)
 [facility] [address] [place], [place] [address]   Office Visit Note  Patient Name: Bridget Reed Date of Birth [id number]  Medical Record number [id number]  Date of Service: 04/24/2024  Chief Complaint  Patient presents with   sick visit     HPI Discussed the use of AI scribe software for clinical note transcription with the patient, who gave verbal consent to proceed.  History of Present Illness Bridget Reed is a 63 year old female who presents with persistent cough and ear congestion.  Cough and upper respiratory symptoms - Persistent cough began about 10 days ago following a scratchy throat, which resolved - Cough intensified by Friday (3 days ago) and has continued - Cough is worse at night and disrupts sleep - No significant mucus production with cough - Some nasal congestion present with some clear (not colored) discharge - No fever, chills, or sweats - No history of asthma or COPD - Never smoked - Denies shortness of breath  Ear congestion and otalgia - Ear congestion and pressure, particularly in the left ear, occasionally painful - Ears feel clogged, especially in the morning - Popping sensation when blowing nose - History of ear congestion/eustachian tube dysfunction occurring a couple of times a year, often with allergies or illness  Sinus pressure and headache - Mild sinus pressure present - No significant headaches unless triggered by coughing spells  Fatigue - Mildly increased fatigue compared to baseline - Not bedridden  Medication use and adverse effects - Has used Mucinex D, Robitussin DM, and Sudafed at different times for symptom management; Also sometimes Nyquil at bedtime. - Medications have elevated blood pressure - Uses nasal spray similar to Nasacort  daily and takes Allegra regularly - Medications provide some relief but cough persists   Current Medication:  Outpatient Encounter Medications as of  04/24/2024  Medication Sig   Betamethasone  Dipropionate (SERNIVO ) 0.05 % EMUL Apply 1 Application topically as directed. qd to bid aa eczema on body, avoid face, groin, axilla   fexofenadine (ALLEGRA) 60 MG tablet Take 60 mg by mouth daily.   furosemide  (LASIX ) 20 MG tablet TAKE ONE TABLET BY MOUTH AS NEEDED FOR EDEMA   hydrochlorothiazide  (HYDRODIURIL ) 25 MG tablet TAKE 1 TABLET BY MOUTH DAILY   hydrOXYzine  (ATARAX ) 25 MG tablet Take 1 tablet (25 mg total) by mouth at bedtime as needed.   ibuprofen  (ADVIL ) 600 MG tablet Take 1 tablet (600 mg total) by mouth every 8 (eight) hours as needed.   levothyroxine  (SYNTHROID ) 112 MCG tablet Take 1 tablet (112 mcg total) by mouth daily.   omeprazole  (PRILOSEC) 40 MG capsule TAKE 1 CAPSULE BY MOUTH DAILY   tacrolimus  (PROTOPIC ) 0.1 % ointment Apply topically as directed. qd to bid aa eczema on face and body   valsartan  (DIOVAN ) 160 MG tablet TAKE 1 TABLET BY MOUTH DAILY   [DISCONTINUED] ketoconazole  (NIZORAL ) 2 % cream qd to itchy areas on body (Patient not taking: Reported on 04/24/2024)   No facility-administered encounter medications on file as of 04/24/2024.      Medical History: Past Medical History:  Diagnosis Date   Allergy     Basal cell carcinoma 06/17/2017   right chest    GERD (gastroesophageal reflux disease)    Hypertension    Left tennis elbow    Morbid obesity (HCC) 07/13/2016     Vital Signs: BP (!) 172/80 Comment: provider informed  Pulse 91   Temp 98.1 F (36.7 C) (Tympanic)   Ht 5'  1 (1.549 m)   Wt 240 lb (108.9 kg)   LMP 11/22/2013 (Approximate)   SpO2 98%   BMI 45.35 kg/m    Review of Systems  Constitutional:  Positive for fatigue. Negative for chills and fever.  HENT:  Positive for congestion, ear pain, sinus pressure, sinus pain and sore throat. Negative for ear discharge, rhinorrhea and trouble swallowing.   Eyes:  Negative for visual disturbance.  Respiratory:  Positive for cough. Negative for shortness of  breath.   Cardiovascular:  Negative for chest pain.  Gastrointestinal:  Negative for diarrhea, nausea and vomiting.  Musculoskeletal:  Negative for myalgias and neck pain.  Neurological:  Negative for headaches.    Physical Exam Vitals reviewed.  Constitutional:      General: She is not in acute distress.    Appearance: She is not ill-appearing.  HENT:     Head: Normocephalic.     Right Ear: Ear canal and external ear normal. Tympanic membrane is not erythematous or bulging.     Left Ear: Ear canal and external ear normal. Tympanic membrane is not erythematous or bulging.     Ears:     Comments: TMs dull bilaterally. Small serous middle ear fluid bilaterally.    Nose: Mucosal edema and congestion present. No rhinorrhea.     Comments: Some pressure with palpation of maxillary sinuses.    Mouth/Throat:     Mouth: Mucous membranes are moist. No oral lesions.     Pharynx: No pharyngeal swelling or posterior oropharyngeal erythema.  Cardiovascular:     Rate and Rhythm: Normal rate and regular rhythm.     Heart sounds: No murmur heard.    No friction rub. No gallop.  Pulmonary:     Effort: Pulmonary effort is normal.     Breath sounds: Normal breath sounds. No wheezing, rhonchi or rales.  Musculoskeletal:     Cervical back: Neck supple. No rigidity.  Lymphadenopathy:     Cervical: No cervical adenopathy.  Neurological:     Mental Status: She is alert.     Assessment/Plan: 1. Acute upper respiratory infection (Primary) 2. Dysfunction of both eustachian tubes 3. Acute cough  - predniSONE  (DELTASONE ) 10 MG tablet; Take 6 tablets on day 1, then 5 tablets on day 2, then 4 tablets on day 3, then 3 tablets on day 4, then 2 tablets on day 5 and 1 tablet on day 6. Take all doses with food.  Dispense: 21 tablet; Refill: 0  Assessment & Plan Acute upper respiratory infection with cough and bilateral eustachian tube dysfunction Symptoms and exam findings most consistent with viral URI  with eustachian tube dysfunction. Decongestant use may be contributing to elevated blood pressure. - Continue Robitussin DM for cough. - Avoid Mucinex D and Sudafed due to blood pressure concerns. - Use Nyquil at night for symptom management. - Continue Nasonex nasal spray and Allegra daily. - Consider saline nasal spray for congestion relief. - Encouraged steam inhalation to help break up sinus/nasal congestion. - Prescribed six-day steroid taper to use only if symptoms persist/not improving despite recommended treatments over next few days. -Advised to monitor symptoms and contact provider/schedule follow up visit as needed for new/worsening symptoms (i.e. fever, increased sinus/ear pain, shortness of breath or chest pain).  Last saw PCP 03/07/24. Next due to F/U in April for blood pressure. Advised to contact PCP if home blood pressures remain high after recovery from illness.   General Counseling: alvina strother understanding of the findings of todays visit and  plan of treatment. she has been encouraged to call the office with any questions or concerns that should arise related to todays visit.    Time spent:20 Minutes    Joen Arts PA-C Physician Assistant

## 2024-04-24 NOTE — Patient Instructions (Signed)
-   Rest and stay well hydrated. - Continue Robitussin DM for cough. - Avoid Mucinex D and Sudafed due to blood pressure concerns. - Use Nyquil at night for symptom management. - Continue Nasonex nasal spray and Allegra daily. - Consider saline nasal spray for congestion relief. - Try inhaling steam to help break up sinus/nasal congestion. - For your cough, use cough drops/throat lozenges and/or drink warm liquids (like tea with honey). - If your ear/sinus pressure or cough are not improving with the recommended treatments over the next 4-5 days, you may fill the prescription and begin taking the steroid (Prednisone ). -Send MyChart message to provider or schedule return visit as needed for new/worsening symptoms (i.e. fever, increased sinus/ear pain, shortness of breath or chest pain).or if symptoms do not improve as discussed with steroid and other recommended treatment.

## 2024-05-03 ENCOUNTER — Encounter: Payer: Self-pay | Admitting: Adult Health

## 2024-05-03 ENCOUNTER — Ambulatory Visit: Payer: Self-pay | Admitting: Adult Health

## 2024-05-03 ENCOUNTER — Ambulatory Visit: Admitting: Dermatology

## 2024-05-03 ENCOUNTER — Encounter: Payer: Self-pay | Admitting: Dermatology

## 2024-05-03 DIAGNOSIS — L820 Inflamed seborrheic keratosis: Secondary | ICD-10-CM | POA: Diagnosis not present

## 2024-05-03 DIAGNOSIS — L2081 Atopic neurodermatitis: Secondary | ICD-10-CM

## 2024-05-03 DIAGNOSIS — D224 Melanocytic nevi of scalp and neck: Secondary | ICD-10-CM | POA: Diagnosis not present

## 2024-05-03 DIAGNOSIS — R051 Acute cough: Secondary | ICD-10-CM

## 2024-05-03 DIAGNOSIS — L209 Atopic dermatitis, unspecified: Secondary | ICD-10-CM | POA: Diagnosis not present

## 2024-05-03 DIAGNOSIS — L304 Erythema intertrigo: Secondary | ICD-10-CM | POA: Diagnosis not present

## 2024-05-03 MED ORDER — ALBUTEROL SULFATE HFA 108 (90 BASE) MCG/ACT IN AERS: INHALATION_SPRAY | RESPIRATORY_TRACT | 0 refills | Status: AC

## 2024-05-03 MED ORDER — FLUCONAZOLE 150 MG PO TABS: 150.0000 mg | ORAL_TABLET | ORAL | 0 refills | Status: AC

## 2024-05-03 NOTE — Patient Instructions (Addendum)

## 2024-05-03 NOTE — Progress Notes (Signed)
° °  Follow-Up Visit   Subjective  Bridget Reed is a 63 y.o. female who presents for the following: check spots R shoulder and R neck, pt scratches at   The following portions of the chart were reviewed this encounter and updated as appropriate: medications, allergies, medical history  Review of Systems:  No other skin or systemic complaints except as noted in HPI or Assessment and Plan.  Objective  Well appearing patient in no apparent distress; mood and affect are within normal limits.   A focused examination was performed of the following areas: Neck, right arm  Relevant exam findings are noted in the Assessment and Plan.  R post auricular x 1, R shoulder x 4 (5) Stuck on waxy paps with erythema  Assessment & Plan   ATOPIC DERMATITIS with INTERTRIGO Exam: pt states clear today, doing weill with current treatment and just needs rfs   Chronic condition with duration or expected duration over one year. Currently well-controlled.    Atopic dermatitis (eczema) is a chronic, relapsing, pruritic condition that can significantly affect quality of life. It is often associated with allergic rhinitis and/or asthma and can require treatment with topical medications, phototherapy, or in severe cases biologic injectable medication (Dupixent ; Adbry) or Oral JAK inhibitors.   Treatment Plan: Continue Diflucan  150 mg 1 po qwk - will send in as 1 po qwk # 12 for 3 month supply.  Continue Tacrolimus  0.1% oint qd/bid aa eczema body Continue Sernivo  spray qd/bid aa eczema on body, avoid f/g/a  Cont Ketoconazole  2% cr qd aa rash body folds  Side effects of fluconazole  (diflucan ) include nausea, diarrhea, headache, dizziness, taste changes, rare risk of irritation of the liver, allergy , or decreased blood counts (which could show up as infection or tiredness).  Topical steroids (such as triamcinolone , fluocinolone, fluocinonide, mometasone, clobetasol , halobetasol, betamethasone ,  hydrocortisone) can cause thinning and lightening of the skin if they are used for too long in the same area. Your physician has selected the right strength medicine for your problem and area affected on the body. Please use your medication only as directed by your physician to prevent side effects.   MELANOCYTIC NEVUS R occipital hairline Exam: 2.5 mm flesh papule  Treatment Plan: Benign appearing on exam today. Recommend observation. Call clinic for new or changing moles. Recommend daily use of broad spectrum spf 30+ sunscreen to sun-exposed areas.   INFLAMED SEBORRHEIC KERATOSIS (5) R post auricular x 1, R shoulder x 4 (5) Symptomatic, irritating, patient would like treated. Destruction of lesion - R post auricular x 1, R shoulder x 4 (5)  Destruction method: cryotherapy   Informed consent: discussed and consent obtained   Lesion destroyed using liquid nitrogen: Yes   Region frozen until ice ball extended beyond lesion: Yes   Outcome: patient tolerated procedure well with no complications   Post-procedure details: wound care instructions given   Additional details:  Prior to procedure, discussed risks of blister formation, small wound, skin dyspigmentation, or rare scar following cryotherapy. Recommend Vaseline ointment to treated areas while healing.    Return for as scheduled for TBSE.  I, Grayce Saunas, RMA, am acting as scribe for Rexene Rattler, MD .   Documentation: I have reviewed the above documentation for accuracy and completeness, and I agree with the above.  Rexene Rattler, MD

## 2024-05-03 NOTE — Progress Notes (Signed)
 Therapist, [facility] [address] [place], [place] [address]   Office Visit Note  Patient Name: Bridget Reed Date of Birth [id number]  Medical Record number [id number]  Date of Service: 05/03/2024  Chief Complaint  Patient presents with   Cough    She stopped the prednisone  for a few days because she was feeling better but restarted taking it yesterday. She feels PND. She took Sudafed and Nyquil yesterday.     HPI Pt is here for a sick visit. She was seen on Dec 1, see note.  She took prednisone  for 2 days then felt better.  Yesterday she restarted with 4 tabs.   She reports a constant tickle in her throat and needing to clear her throat.  The cough is not productive.    Current Medication:  Outpatient Encounter Medications as of 05/03/2024  Medication Sig   albuterol  (VENTOLIN  HFA) 108 (90 Base) MCG/ACT inhaler Inhale 2 puffs three times daily for one week.  Then use 2 puffs every 4-6 hours as needed for cough   Betamethasone  Dipropionate (SERNIVO ) 0.05 % EMUL Apply 1 Application topically as directed. qd to bid aa eczema on body, avoid face, groin, axilla   fexofenadine (ALLEGRA) 60 MG tablet Take 60 mg by mouth daily.   fluconazole  (DIFLUCAN ) 150 MG tablet Take 1 tablet (150 mg total) by mouth once a week.   furosemide  (LASIX ) 20 MG tablet TAKE ONE TABLET BY MOUTH AS NEEDED FOR EDEMA   hydrochlorothiazide  (HYDRODIURIL ) 25 MG tablet TAKE 1 TABLET BY MOUTH DAILY   hydrOXYzine  (ATARAX ) 25 MG tablet Take 1 tablet (25 mg total) by mouth at bedtime as needed.   ibuprofen  (ADVIL ) 600 MG tablet Take 1 tablet (600 mg total) by mouth every 8 (eight) hours as needed.   levothyroxine  (SYNTHROID ) 112 MCG tablet Take 1 tablet (112 mcg total) by mouth daily.   omeprazole  (PRILOSEC) 40 MG capsule TAKE 1 CAPSULE BY MOUTH DAILY   predniSONE  (DELTASONE ) 10 MG tablet Take 6 tablets on day 1, then 5 tablets on day 2, then 4 tablets on day 3, then 3 tablets on day 4, then 2 tablets on day 5 and  1 tablet on day 6. Take all doses with food.   tacrolimus  (PROTOPIC ) 0.1 % ointment Apply topically as directed. qd to bid aa eczema on face and body   valsartan  (DIOVAN ) 160 MG tablet TAKE 1 TABLET BY MOUTH DAILY   No facility-administered encounter medications on file as of 05/03/2024.      Medical History: Past Medical History:  Diagnosis Date   Allergy     Basal cell carcinoma 06/17/2017   right chest    GERD (gastroesophageal reflux disease)    Hypertension    Left tennis elbow    Morbid obesity (HCC) 07/13/2016     Vital Signs: BP 130/82   Pulse 88   Temp (!) 96.4 F (35.8 C)   LMP 11/22/2013 (Approximate)   SpO2 99%    Review of Systems  Constitutional:  Negative for chills, fatigue and fever.  HENT:  Positive for postnasal drip. Negative for congestion.   Eyes:  Negative for pain and itching.  Respiratory:  Positive for cough.   Cardiovascular:  Negative for chest pain.  Gastrointestinal:  Negative for diarrhea, nausea and vomiting.    Physical Exam Vitals reviewed.  Constitutional:      Appearance: Normal appearance.  HENT:     Head: Normocephalic.     Right Ear: Tympanic membrane and ear  canal normal.     Left Ear: Tympanic membrane and ear canal normal.     Nose: Nose normal.     Mouth/Throat:     Mouth: Mucous membranes are moist.  Cardiovascular:     Pulses: Normal pulses.  Pulmonary:     Effort: Pulmonary effort is normal.  Lymphadenopathy:     Cervical: No cervical adenopathy.  Neurological:     Mental Status: She is alert.    Assessment/Plan: 1. Acute cough (Primary) Use albuterol  and take dramamine as discussed.  - albuterol  (VENTOLIN  HFA) 108 (90 Base) MCG/ACT inhaler; Inhale 2 puffs three times daily for one week.  Then use 2 puffs every 4-6 hours as needed for cough  Dispense: 8 g; Refill: 0     General Counseling: Bridget Reed verbalizes understanding of the findings of todays visit and agrees with plan of treatment. I have discussed any  further diagnostic evaluation that may be needed or ordered today. We also reviewed her medications today. she has been encouraged to call the office with any questions or concerns that should arise related to todays visit.   No orders of the defined types were placed in this encounter.   Meds ordered this encounter  Medications   albuterol  (VENTOLIN  HFA) 108 (90 Base) MCG/ACT inhaler    Sig: Inhale 2 puffs three times daily for one week.  Then use 2 puffs every 4-6 hours as needed for cough    Dispense:  8 g    Refill:  0    Time spent:15 Minutes    Juliene DOROTHA Howells AGNP-C Nurse Practitioner

## 2024-05-10 ENCOUNTER — Other Ambulatory Visit: Payer: Self-pay | Admitting: Family

## 2024-05-10 DIAGNOSIS — E039 Hypothyroidism, unspecified: Secondary | ICD-10-CM

## 2024-07-05 ENCOUNTER — Encounter: Admitting: Family

## 2024-12-26 ENCOUNTER — Encounter: Admitting: Dermatology
